# Patient Record
Sex: Female | Born: 1935 | Race: White | Hispanic: No | Marital: Married | Smoking: Never smoker
Health system: Southern US, Community
[De-identification: ages and names within clinical notes are randomized; demographics above are authoritative.]

## PROBLEM LIST (undated history)

## (undated) DIAGNOSIS — R05 Cough: Secondary | ICD-10-CM

## (undated) DIAGNOSIS — E039 Hypothyroidism, unspecified: Secondary | ICD-10-CM

## (undated) DIAGNOSIS — I10 Essential (primary) hypertension: Secondary | ICD-10-CM

## (undated) DIAGNOSIS — E785 Hyperlipidemia, unspecified: Secondary | ICD-10-CM

## (undated) DIAGNOSIS — M1990 Unspecified osteoarthritis, unspecified site: Secondary | ICD-10-CM

## (undated) DIAGNOSIS — Z972 Presence of dental prosthetic device (complete) (partial): Secondary | ICD-10-CM

## (undated) DIAGNOSIS — K219 Gastro-esophageal reflux disease without esophagitis: Secondary | ICD-10-CM

## (undated) DIAGNOSIS — R059 Cough, unspecified: Secondary | ICD-10-CM

## (undated) HISTORY — PX: CATARACT EXTRACTION: SUR2

## (undated) HISTORY — DX: Hyperlipidemia, unspecified: E78.5

## (undated) HISTORY — PX: BREAST CYST ASPIRATION: SHX578

## (undated) HISTORY — PX: TUBAL LIGATION: SHX77

## (undated) HISTORY — PX: BREAST LUMPECTOMY: SHX2

---

## 1976-07-23 HISTORY — PX: BREAST BIOPSY: SHX20

## 1976-07-23 HISTORY — PX: BREAST EXCISIONAL BIOPSY: SUR124

## 2001-06-04 ENCOUNTER — Other Ambulatory Visit: Admission: RE | Admit: 2001-06-04 | Discharge: 2001-06-04 | Payer: Self-pay | Admitting: Family Medicine

## 2004-09-29 ENCOUNTER — Ambulatory Visit: Payer: Self-pay | Admitting: Family Medicine

## 2005-10-09 ENCOUNTER — Ambulatory Visit: Payer: Self-pay | Admitting: Family Medicine

## 2006-11-06 ENCOUNTER — Ambulatory Visit: Payer: Self-pay | Admitting: Family Medicine

## 2006-12-17 ENCOUNTER — Ambulatory Visit: Payer: Self-pay | Admitting: Gastroenterology

## 2006-12-17 LAB — HM COLONOSCOPY

## 2007-09-09 ENCOUNTER — Ambulatory Visit: Payer: Self-pay | Admitting: Gastroenterology

## 2007-11-27 ENCOUNTER — Ambulatory Visit: Payer: Self-pay | Admitting: Family Medicine

## 2008-11-29 ENCOUNTER — Ambulatory Visit: Payer: Self-pay | Admitting: Family Medicine

## 2009-12-20 ENCOUNTER — Ambulatory Visit: Payer: Self-pay | Admitting: Family Medicine

## 2010-12-11 LAB — HM PAP SMEAR: HM PAP: NORMAL

## 2011-01-10 ENCOUNTER — Ambulatory Visit: Payer: Self-pay | Admitting: Family Medicine

## 2012-01-18 ENCOUNTER — Ambulatory Visit: Payer: Self-pay | Admitting: Family Medicine

## 2012-03-14 ENCOUNTER — Ambulatory Visit: Payer: Self-pay | Admitting: Family Medicine

## 2012-12-19 ENCOUNTER — Ambulatory Visit: Payer: Self-pay | Admitting: Family Medicine

## 2012-12-23 ENCOUNTER — Ambulatory Visit: Payer: Self-pay | Admitting: Family Medicine

## 2012-12-23 LAB — HM DEXA SCAN

## 2013-01-19 ENCOUNTER — Ambulatory Visit: Payer: Self-pay | Admitting: Family Medicine

## 2013-09-04 ENCOUNTER — Ambulatory Visit: Payer: Self-pay | Admitting: Family Medicine

## 2014-01-20 ENCOUNTER — Ambulatory Visit: Payer: Self-pay | Admitting: Family Medicine

## 2014-01-20 LAB — HM MAMMOGRAPHY: HM Mammogram: NORMAL

## 2014-03-15 ENCOUNTER — Ambulatory Visit: Payer: Self-pay | Admitting: Family Medicine

## 2014-03-15 LAB — CBC AND DIFFERENTIAL
HEMATOCRIT: 39 % (ref 36–46)
Hemoglobin: 13.1 g/dL (ref 12.0–16.0)
Neutrophils Absolute: 52 /uL
Platelets: 264 10*3/uL (ref 150–399)
WBC: 5.4 10*3/mL

## 2014-03-15 LAB — LIPID PANEL
CHOLESTEROL: 159 mg/dL (ref 0–200)
HDL: 58 mg/dL (ref 35–70)
LDL Cholesterol: 84 mg/dL
LDl/HDL Ratio: 1.4
TRIGLYCERIDES: 85 mg/dL (ref 40–160)

## 2014-03-15 LAB — HEPATIC FUNCTION PANEL
ALK PHOS: 61 U/L (ref 25–125)
ALT: 14 U/L (ref 7–35)
AST: 22 U/L (ref 13–35)
Bilirubin, Total: 0.5 mg/dL

## 2014-06-07 LAB — BASIC METABOLIC PANEL
BUN: 15 mg/dL (ref 4–21)
CREATININE: 0.9 mg/dL (ref <=1.1)
Glucose: 93 mg/dL
POTASSIUM: 3.9 mmol/L (ref 3.4–5.3)
Sodium: 139 mmol/L (ref 137–147)

## 2014-07-21 ENCOUNTER — Ambulatory Visit: Payer: Self-pay | Admitting: Ophthalmology

## 2014-07-21 DIAGNOSIS — Z01810 Encounter for preprocedural cardiovascular examination: Secondary | ICD-10-CM

## 2014-07-21 DIAGNOSIS — I10 Essential (primary) hypertension: Secondary | ICD-10-CM

## 2014-07-21 LAB — POTASSIUM: Potassium: 3.8 mmol/L (ref 3.5–5.1)

## 2014-08-03 ENCOUNTER — Ambulatory Visit: Payer: Self-pay | Admitting: Ophthalmology

## 2014-08-03 DIAGNOSIS — K219 Gastro-esophageal reflux disease without esophagitis: Secondary | ICD-10-CM | POA: Diagnosis not present

## 2014-08-03 DIAGNOSIS — R05 Cough: Secondary | ICD-10-CM | POA: Diagnosis not present

## 2014-08-03 DIAGNOSIS — H2512 Age-related nuclear cataract, left eye: Secondary | ICD-10-CM | POA: Diagnosis not present

## 2014-08-03 DIAGNOSIS — R609 Edema, unspecified: Secondary | ICD-10-CM | POA: Diagnosis not present

## 2014-08-03 DIAGNOSIS — E780 Pure hypercholesterolemia: Secondary | ICD-10-CM | POA: Diagnosis not present

## 2014-08-03 DIAGNOSIS — Z79899 Other long term (current) drug therapy: Secondary | ICD-10-CM | POA: Diagnosis not present

## 2014-08-03 DIAGNOSIS — I10 Essential (primary) hypertension: Secondary | ICD-10-CM | POA: Diagnosis not present

## 2014-08-03 DIAGNOSIS — K279 Peptic ulcer, site unspecified, unspecified as acute or chronic, without hemorrhage or perforation: Secondary | ICD-10-CM | POA: Diagnosis not present

## 2014-08-17 ENCOUNTER — Ambulatory Visit: Payer: Self-pay | Admitting: Ophthalmology

## 2014-08-17 DIAGNOSIS — H2511 Age-related nuclear cataract, right eye: Secondary | ICD-10-CM | POA: Diagnosis not present

## 2014-08-17 DIAGNOSIS — Z01812 Encounter for preprocedural laboratory examination: Secondary | ICD-10-CM | POA: Diagnosis not present

## 2014-08-17 LAB — POTASSIUM: Potassium: 3.2 mmol/L — ABNORMAL LOW (ref 3.5–5.1)

## 2014-08-24 ENCOUNTER — Ambulatory Visit: Payer: Self-pay | Admitting: Ophthalmology

## 2014-08-24 DIAGNOSIS — Z79899 Other long term (current) drug therapy: Secondary | ICD-10-CM | POA: Diagnosis not present

## 2014-08-24 DIAGNOSIS — H2511 Age-related nuclear cataract, right eye: Secondary | ICD-10-CM | POA: Diagnosis not present

## 2014-08-24 DIAGNOSIS — R05 Cough: Secondary | ICD-10-CM | POA: Diagnosis not present

## 2014-08-24 DIAGNOSIS — I10 Essential (primary) hypertension: Secondary | ICD-10-CM | POA: Diagnosis not present

## 2014-08-24 DIAGNOSIS — E780 Pure hypercholesterolemia: Secondary | ICD-10-CM | POA: Diagnosis not present

## 2014-08-24 DIAGNOSIS — R609 Edema, unspecified: Secondary | ICD-10-CM | POA: Diagnosis not present

## 2014-08-24 DIAGNOSIS — M810 Age-related osteoporosis without current pathological fracture: Secondary | ICD-10-CM | POA: Diagnosis not present

## 2014-08-24 DIAGNOSIS — K219 Gastro-esophageal reflux disease without esophagitis: Secondary | ICD-10-CM | POA: Diagnosis not present

## 2014-08-24 DIAGNOSIS — K279 Peptic ulcer, site unspecified, unspecified as acute or chronic, without hemorrhage or perforation: Secondary | ICD-10-CM | POA: Diagnosis not present

## 2014-09-14 DIAGNOSIS — R5383 Other fatigue: Secondary | ICD-10-CM | POA: Diagnosis not present

## 2014-09-14 DIAGNOSIS — E039 Hypothyroidism, unspecified: Secondary | ICD-10-CM | POA: Diagnosis not present

## 2014-09-14 DIAGNOSIS — J309 Allergic rhinitis, unspecified: Secondary | ICD-10-CM | POA: Diagnosis not present

## 2014-09-14 DIAGNOSIS — R05 Cough: Secondary | ICD-10-CM | POA: Diagnosis not present

## 2014-09-14 LAB — TSH: TSH: 3.25 u[IU]/mL (ref <=5.90)

## 2014-09-24 DIAGNOSIS — Z961 Presence of intraocular lens: Secondary | ICD-10-CM | POA: Diagnosis not present

## 2014-11-21 NOTE — Op Note (Signed)
PATIENT NAME:  Lynn Williams, Lynn Williams MR#:  [id number] DATE OF BIRTH:  23-Dec-1931  DATE OF PROCEDURE:  08/03/2014  PREOPERATIVE DIAGNOSIS:  Nuclear sclerotic cataract of the left eye.   POSTOPERATIVE DIAGNOSIS:  Nuclear sclerotic cataract of the left eye.   OPERATIVE PROCEDURE:  Cataract extraction by phacoemulsification with implant of intraocular lens to left eye.   SURGEON:  Birder Robson, MD.   ANESTHESIA:  1. Managed anesthesia care.  2. Topical tetracaine drops followed by 2% Xylocaine jelly applied in the preoperative holding area.   COMPLICATIONS:  None.   TECHNIQUE:   Stop and chop.  DESCRIPTION OF PROCEDURE:  The patient was examined and consented in the preoperative holding area where the aforementioned topical anesthesia was applied to the left eye and then brought back to the operating room where the left eye was prepped and draped in the usual sterile ophthalmic fashion and a lid speculum was placed. A paracentesis was created with the side port blade and the anterior chamber was filled with viscoelastic. A near clear corneal incision was performed with the steel keratome. A continuous curvilinear capsulorrhexis was performed with a cystotome followed by the capsulorrhexis forceps. Hydrodissection and hydrodelineation were carried out with BSS on a blunt cannula. The lens was removed in a stop and chop technique and the remaining cortical material was removed with the irrigation-aspiration handpiece. The capsular bag was inflated with viscoelastic and the Tecnis ZCB00 29.5-diopter lens, serial number [id number] was placed in the capsular bag without complication. The remaining viscoelastic was removed from the eye with the irrigation-aspiration handpiece. The wounds were hydrated. The anterior chamber was flushed with Miostat and the eye was inflated to physiologic pressure. 0.1 mL of cefuroxime concentration 10 mg/mL was placed in the anterior chamber. The wounds were found to be  water tight. The eye was dressed with Vigamox. The patient was given protective glasses to wear throughout the day and a shield with which to sleep tonight. The patient was also given drops with which to begin a drop regimen today and will follow-up with me in one day.    ____________________________ Livingston Diones. Kyshawn Teal, MD wlp:bm D: 08/03/2014 21:07:10 ET T: 08/04/2014 04:01:58 ET JOB#: [id number]  cc: Eowyn Tabone L. Willetta York, MD, <Dictator> Livingston Diones Sister Carbone MD ELECTRONICALLY SIGNED 08/04/2014 11:19

## 2014-11-26 DIAGNOSIS — M1990 Unspecified osteoarthritis, unspecified site: Secondary | ICD-10-CM | POA: Insufficient documentation

## 2014-11-26 DIAGNOSIS — E039 Hypothyroidism, unspecified: Secondary | ICD-10-CM | POA: Insufficient documentation

## 2014-11-26 DIAGNOSIS — J309 Allergic rhinitis, unspecified: Secondary | ICD-10-CM | POA: Insufficient documentation

## 2014-11-26 DIAGNOSIS — K5790 Diverticulosis of intestine, part unspecified, without perforation or abscess without bleeding: Secondary | ICD-10-CM | POA: Insufficient documentation

## 2014-11-26 DIAGNOSIS — E7800 Pure hypercholesterolemia, unspecified: Secondary | ICD-10-CM | POA: Insufficient documentation

## 2014-11-26 DIAGNOSIS — C44311 Basal cell carcinoma of skin of nose: Secondary | ICD-10-CM | POA: Insufficient documentation

## 2014-11-26 DIAGNOSIS — G4700 Insomnia, unspecified: Secondary | ICD-10-CM | POA: Insufficient documentation

## 2014-11-26 DIAGNOSIS — I10 Essential (primary) hypertension: Secondary | ICD-10-CM | POA: Insufficient documentation

## 2014-11-26 DIAGNOSIS — N6019 Diffuse cystic mastopathy of unspecified breast: Secondary | ICD-10-CM | POA: Insufficient documentation

## 2015-01-11 ENCOUNTER — Other Ambulatory Visit: Payer: Self-pay

## 2015-01-11 MED ORDER — ALPRAZOLAM 0.5 MG PO TABS: 0.5000 mg | ORAL_TABLET | Freq: Every day | ORAL | Status: DC

## 2015-01-11 NOTE — Telephone Encounter (Signed)
Sorry, I put it on the first one I did and forgot to on this one.  It is [facility].  Thanks  ED

## 2015-01-25 ENCOUNTER — Encounter: Payer: Self-pay | Admitting: Family Medicine

## 2015-01-25 ENCOUNTER — Ambulatory Visit (INDEPENDENT_AMBULATORY_CARE_PROVIDER_SITE_OTHER): Payer: Commercial Managed Care - HMO | Admitting: Family Medicine

## 2015-01-25 VITALS — BP 120/78 | HR 72 | Temp 98.2°F | Resp 17 | Ht 61.5 in | Wt 175.6 lb

## 2015-01-25 DIAGNOSIS — Z0000 Encounter for general adult medical examination without abnormal findings: Secondary | ICD-10-CM | POA: Diagnosis not present

## 2015-01-25 NOTE — Progress Notes (Signed)
Patient ID: [name], female   DOB: [birth year], 81 y.o.   MRN: [id number]       Patient: Lynn Williams, Female    DOB: 10/07/1931, 81 y.o.   MRN: [id number] Visit Date: 01/25/2015  Today's Provider: [name], MD   Chief Complaint  Patient presents with  . Annual Exam    last Annual Exam was in 12/23/2013   Subjective:    Annual physical exam Lynn Williams is a 81 y.o. female who presents today for health maintenance and complete physical. She feels well. She reports exercising : four days a week, go to a fitness center. She reports she is sleeping well.  -----------------------------------------------------------------   Review of Systems  Constitutional: Negative.   HENT: Negative.  Negative for hearing loss.        Seasonal allergies  Eyes: Negative.   Respiratory: Positive for cough.        Some comes from the allergies", MD awaird, pt stated  Cardiovascular: Negative.   Gastrointestinal: Negative.   Endocrine: Negative.   Musculoskeletal: Negative.   Skin: Negative.   Allergic/Immunologic: Positive for environmental allergies.  Hematological: Negative.   Psychiatric/Behavioral: Negative.     Social History She  reports that she has never smoked. She does not have any smokeless tobacco history on file.  Patient Active Problem List   Diagnosis Date Noted  . Allergic rhinitis 11/26/2014  . Basal cell carcinoma of nose 11/26/2014  . DD (diverticular disease) 11/26/2014  . Bloodgood disease 11/26/2014  . Essential (primary) hypertension 11/26/2014  . Hypercholesteremia 11/26/2014  . Adult hypothyroidism 11/26/2014  . Cannot sleep 11/26/2014  . Arthritis, degenerative 11/26/2014    Past Surgical History  Procedure Laterality Date  . Tubal ligation    . Breast lumpectomy Right     Family History Her family history includes Alzheimer's disease in her sister; COPD in her brother and brother; Coronary artery disease in her sister  and sister; Diabetes in her sister; Heart attack in her brother, brother, mother, sister, and sister; Heart disease in her brother, brother, and sister; Hypertension in her mother and sister; Prostate cancer in her father; Stroke in her mother and sister.    Previous Medications   ALPRAZOLAM (XANAX) 0.5 MG TABLET    Take 1 tablet (0.5 mg total) by mouth at bedtime.   CALCIUM-MAGNESIUM-VITAMIN D (CALCIUM 500 PO)    Take by mouth.   CETIRIZINE (ZYRTEC) 10 MG TABLET    Take by mouth.   FLUTICASONE (FLONASE) 50 MCG/ACT NASAL SPRAY    Place into the nose.   FUROSEMIDE (LASIX) 20 MG TABLET    Take by mouth.   HYDROCHLOROTHIAZIDE (HYDRODIURIL) 25 MG TABLET    Take by mouth.   LEVOTHYROXINE (SYNTHROID, LEVOTHROID) 50 MCG TABLET    Take by mouth.   MULTIPLE VITAMINS-MINERALS (MULTIVITAMIN ADULT PO)    Take by mouth.   OMEPRAZOLE (PRILOSEC) 20 MG CAPSULE    Take by mouth.   RANITIDINE (ZANTAC) 150 MG TABLET    Take by mouth.   SIMVASTATIN (ZOCOR) 20 MG TABLET    Take by mouth.    Patient Care Team: Jerrol Banana., MD as PCP - General (Family Medicine)     Objective:   Vitals: BP 120/78 mmHg  Pulse 72  Temp(Src) 98.2 F (36.8 C) (Oral)  Resp 17  Ht 5' 1.5" (1.562 m)  Wt 175 lb 9.6 oz (79.652 kg)  BMI 32.65 kg/m2  LMP  (LMP Unknown)  Physical Exam  Constitutional: She is oriented to person, place, and time. She appears well-developed and well-nourished.  HENT:  Head: Normocephalic and atraumatic.  Right Ear: External ear normal.  Left Ear: External ear normal.  Nose: Nose normal.  Mouth/Throat: Oropharynx is clear and moist.  Eyes: Conjunctivae and EOM are normal. Pupils are equal, round, and reactive to light.  Neck: Normal range of motion. Neck supple. No thyromegaly present.  Cardiovascular: Normal rate, regular rhythm, normal heart sounds and intact distal pulses.   Mild spider varicose veins of lower extremities  Pulmonary/Chest: Effort normal and breath sounds normal.    Abdominal: Soft.  Genitourinary: No breast swelling, tenderness, discharge or bleeding.  Lymphadenopathy:    She has no cervical adenopathy.  Neurological: She is alert and oriented to person, place, and time.  Skin: Skin is warm and dry.  Psychiatric: She has a normal mood and affect. Her behavior is normal. Judgment and thought content normal.     Depression Screen PHQ 2/9 Scores 01/25/2015  PHQ - 2 Score 0      Assessment & Plan:    Annual wellness exam   Routine Health Maintenance and Physical Exam  Exercise Activities and Dietary recommendations Goals    None      Immunization History  Administered Date(s) Administered  . Pneumococcal Conjugate-13 12/23/2013  . Pneumococcal Polysaccharide-23 07/24/1996, 08/04/2003    Health Maintenance  Topic Date Due  . TETANUS/TDAP  05/07/1953  . ZOSTAVAX  05/07/1994  . INFLUENZA VACCINE  02/21/2015  . COLONOSCOPY  12/16/2016  . DEXA SCAN  Completed  . PNA vac Low Risk Adult  Completed      Discussed health benefits of physical activity, and encouraged her to engage in regular exercise appropriate for her age and condition.   Weight gain. Currently not worrisome. We'll evaluate on next office visit 1-2 months. - Annual Medicare wellness exam -------------------------------------------------------------------

## 2015-03-15 ENCOUNTER — Ambulatory Visit (INDEPENDENT_AMBULATORY_CARE_PROVIDER_SITE_OTHER): Payer: Commercial Managed Care - HMO | Admitting: Family Medicine

## 2015-03-15 DIAGNOSIS — R635 Abnormal weight gain: Secondary | ICD-10-CM

## 2015-03-15 DIAGNOSIS — E039 Hypothyroidism, unspecified: Secondary | ICD-10-CM | POA: Diagnosis not present

## 2015-03-15 DIAGNOSIS — E785 Hyperlipidemia, unspecified: Secondary | ICD-10-CM | POA: Diagnosis not present

## 2015-03-15 DIAGNOSIS — I10 Essential (primary) hypertension: Secondary | ICD-10-CM

## 2015-03-15 DIAGNOSIS — I499 Cardiac arrhythmia, unspecified: Secondary | ICD-10-CM

## 2015-03-15 NOTE — Progress Notes (Signed)
Patient ID: Lynn Williams, female   DOB: Dec 16, 1931, 81 y.o.   MRN: [id number]   Lynn Williams  MRN: [id number] DOB: 1932-06-15  Subjective:  HPI   1. Weight gain Patient is an 81 year old female who presents today for follow up of weight gain.  The patient was last seen on 01/25/15.  Her weight at that time was 175, this was a 4 pound weight gain from her previous visit in February 2016.  The patient today is 172, which is a 3 pound weight loss.    2. Irregular heart beat Patient reports that about 3 weeks ago she had an episode of having an irregular heartbeat.  She noticed it when she went to bed one night.  She noted that every time it would skip a beat it would make her cough.  She checked her BP at that time and said that it was at first higher than her normal.  Her machine recorded her HR at around 80 and irregular.  She said she took an ASA, and later checked her BP and things had returned to normal and she no longer felt the skipped beats or had any coughing.  Patient states that this episode lasted about 3 hours and she has no longer had any episodes.  Patient Active Problem List   Diagnosis Date Noted  . Medicare annual wellness visit, subsequent 01/25/2015  . Allergic rhinitis 11/26/2014  . Basal cell carcinoma of nose 11/26/2014  . DD (diverticular disease) 11/26/2014  . Bloodgood disease 11/26/2014  . Essential (primary) hypertension 11/26/2014  . Hypercholesteremia 11/26/2014  . Adult hypothyroidism 11/26/2014  . Cannot sleep 11/26/2014  . Arthritis, degenerative 11/26/2014    No past medical history on file.  Social History   Social History  . Marital Status: Married    Spouse Name: N/A  . Number of Children: N/A  . Years of Education: N/A   Occupational History  . Not on file.   Social History Main Topics  . Smoking status: Never Smoker   . Smokeless tobacco: Not on file  . Alcohol Use: Not on file  . Drug Use: Not on file  . Sexual Activity: Not  on file   Other Topics Concern  . Not on file   Social History Narrative    Outpatient Prescriptions Prior to Visit  Medication Sig Dispense Refill  . ALPRAZolam (XANAX) 0.5 MG tablet Take 1 tablet (0.5 mg total) by mouth at bedtime. 30 tablet 5  . Calcium-Magnesium-Vitamin D (CALCIUM 500 PO) Take by mouth.    . cetirizine (ZYRTEC) 10 MG tablet Take by mouth.    . furosemide (LASIX) 20 MG tablet Take by mouth.    . hydrochlorothiazide (HYDRODIURIL) 25 MG tablet Take by mouth.    . levothyroxine (SYNTHROID, LEVOTHROID) 50 MCG tablet Take by mouth.    . Multiple Vitamins-Minerals (MULTIVITAMIN ADULT PO) Take by mouth.    Marland Kitchen omeprazole (PRILOSEC) 20 MG capsule Take by mouth.    . ranitidine (ZANTAC) 150 MG tablet Take by mouth.    . simvastatin (ZOCOR) 20 MG tablet Take by mouth.    . fluticasone (FLONASE) 50 MCG/ACT nasal spray Place into the nose.     No facility-administered medications prior to visit.    Allergies  Allergen Reactions  . Sulfa Antibiotics     Review of Systems  Constitutional: Negative for fever, chills, weight loss, malaise/fatigue and diaphoresis.  Respiratory: Positive for cough (chronic with no changes). Negative for hemoptysis, sputum  production, shortness of breath and wheezing.   Cardiovascular: Positive for palpitations. Negative for chest pain, orthopnea and leg swelling.  Gastrointestinal: Negative.   Genitourinary: Negative.   Neurological: Negative for dizziness, weakness and headaches.  Psychiatric/Behavioral: Negative.    Objective:  LMP  (LMP Unknown)  Physical Exam  Constitutional: She is oriented to person, place, and time and well-developed, well-nourished, and in no distress.  HENT:  Head: Normocephalic and atraumatic.  Right Ear: External ear normal.  Left Ear: External ear normal.  Nose: Nose normal.  Eyes: Conjunctivae are normal.  Neck: Normal range of motion. Neck supple.  Cardiovascular: Normal rate, regular rhythm and normal  heart sounds.   Pulmonary/Chest: Effort normal and breath sounds normal.  Abdominal: Soft.  Neurological: She is alert and oriented to person, place, and time.  Skin: Skin is warm and dry.  Psychiatric: Mood, memory, affect and judgment normal.    Assessment and Plan :   1. Weight gain 3 pound weight loss since 01/25/15.    2. Irregular heart beat  - EKG 12-Lead 3. Hypertension 4. Hypothyroidism Patient to return in 4-6 months.  Miguel Aschoff MD [facility] 03/15/2015 8:23 AM

## 2015-03-17 LAB — COMPREHENSIVE METABOLIC PANEL
ALBUMIN: 4.4 g/dL (ref 3.5–4.7)
ALT: 15 IU/L (ref 0–32)
AST: 22 IU/L (ref 0–40)
Albumin/Globulin Ratio: 1.7 (ref 1.1–2.5)
Alkaline Phosphatase: 61 IU/L (ref 39–117)
BILIRUBIN TOTAL: 0.6 mg/dL (ref 0.0–1.2)
BUN / CREAT RATIO: 15 (ref 11–26)
BUN: 14 mg/dL (ref 8–27)
CHLORIDE: 97 mmol/L (ref 97–108)
CO2: 30 mmol/L — ABNORMAL HIGH (ref 18–29)
Calcium: 9.6 mg/dL (ref 8.7–10.3)
Creatinine, Ser: 0.94 mg/dL (ref 0.57–1.00)
GFR calc non Af Amer: 57 mL/min/{1.73_m2} — ABNORMAL LOW (ref >59)
GFR, EST AFRICAN AMERICAN: 66 mL/min/{1.73_m2} (ref >59)
Globulin, Total: 2.6 g/dL (ref 1.5–4.5)
Glucose: 93 mg/dL (ref 65–99)
Potassium: 4.1 mmol/L (ref 3.5–5.2)
SODIUM: 142 mmol/L (ref 134–144)
TOTAL PROTEIN: 7 g/dL (ref 6.0–8.5)

## 2015-03-17 LAB — LIPID PANEL WITH LDL/HDL RATIO
CHOLESTEROL TOTAL: 162 mg/dL (ref 100–199)
HDL: 57 mg/dL (ref >39)
LDL Calculated: 86 mg/dL (ref 0–99)
LDl/HDL Ratio: 1.5 ratio units (ref 0.0–3.2)
Triglycerides: 95 mg/dL (ref 0–149)
VLDL CHOLESTEROL CAL: 19 mg/dL (ref 5–40)

## 2015-03-17 LAB — TSH: TSH: 2.38 u[IU]/mL (ref 0.450–4.500)

## 2015-03-24 DIAGNOSIS — Z961 Presence of intraocular lens: Secondary | ICD-10-CM | POA: Diagnosis not present

## 2015-05-04 ENCOUNTER — Ambulatory Visit (INDEPENDENT_AMBULATORY_CARE_PROVIDER_SITE_OTHER): Payer: Commercial Managed Care - HMO | Admitting: Family Medicine

## 2015-05-04 DIAGNOSIS — Z23 Encounter for immunization: Secondary | ICD-10-CM | POA: Diagnosis not present

## 2015-07-11 ENCOUNTER — Other Ambulatory Visit: Payer: Self-pay

## 2015-07-11 MED ORDER — LEVOTHYROXINE SODIUM 50 MCG PO TABS: 50.0000 ug | ORAL_TABLET | Freq: Every day | ORAL | Status: DC

## 2015-08-25 ENCOUNTER — Encounter: Payer: Self-pay | Admitting: Family Medicine

## 2015-08-25 ENCOUNTER — Ambulatory Visit (INDEPENDENT_AMBULATORY_CARE_PROVIDER_SITE_OTHER): Payer: Commercial Managed Care - HMO | Admitting: Family Medicine

## 2015-08-25 VITALS — BP 138/80 | HR 74 | Temp 97.5°F | Resp 16 | Ht 62.0 in | Wt 171.0 lb

## 2015-08-25 DIAGNOSIS — Z0000 Encounter for general adult medical examination without abnormal findings: Secondary | ICD-10-CM | POA: Diagnosis not present

## 2015-08-25 DIAGNOSIS — Z1239 Encounter for other screening for malignant neoplasm of breast: Secondary | ICD-10-CM | POA: Diagnosis not present

## 2015-08-25 MED ORDER — SIMVASTATIN 20 MG PO TABS: 20.0000 mg | ORAL_TABLET | Freq: Every day | ORAL | Status: DC

## 2015-08-25 MED ORDER — FUROSEMIDE 20 MG PO TABS: 20.0000 mg | ORAL_TABLET | Freq: Every day | ORAL | Status: DC

## 2015-08-25 MED ORDER — OMEPRAZOLE 20 MG PO CPDR: 20.0000 mg | DELAYED_RELEASE_CAPSULE | Freq: Every day | ORAL | Status: DC

## 2015-08-25 MED ORDER — ALPRAZOLAM 0.5 MG PO TABS: 0.5000 mg | ORAL_TABLET | Freq: Every day | ORAL | Status: DC

## 2015-08-25 MED ORDER — HYDROCHLOROTHIAZIDE 25 MG PO TABS: 25.0000 mg | ORAL_TABLET | Freq: Every day | ORAL | Status: DC

## 2015-08-25 MED ORDER — RANITIDINE HCL 150 MG PO TABS: 150.0000 mg | ORAL_TABLET | Freq: Two times a day (BID) | ORAL | Status: DC

## 2015-08-25 MED ORDER — LEVOTHYROXINE SODIUM 50 MCG PO TABS: 50.0000 ug | ORAL_TABLET | Freq: Every day | ORAL | Status: DC

## 2015-08-25 NOTE — Progress Notes (Signed)
Patient ID: [name], female   DOB: Jul 22, 1932, 82 y.o.   MRN: [id number] Patient: Lynn Williams, Female    DOB: 1931/12/06, 82 y.o.   MRN: [id number] Visit Date: 08/25/2015  Today's Provider: [name], MD   Chief Complaint  Patient presents with  . Medicare Wellness   Subjective:   Lynn Williams is a 82 y.o. female who presents today for her Subsequent Annual Wellness Visit. She feels well. She reports exercising 4 days a week in a exercise class. She reports she is sleeping well. Pt had 1 week GI bug about 2 weeks ago which has completely resolved. Pap- 12/11/10- Normal Mammogram- 01/20/14 Normal Colonoscopy- 12/17/06 Diverticulosis, hemorrhoids BMD- 12/23/12   Immunization History  Administered Date(s) Administered  . Influenza, High Dose Seasonal PF 05/04/2015  . Pneumococcal Conjugate-13 12/23/2013  . Pneumococcal Polysaccharide-23 07/24/1996, 08/04/2003     Review of Systems  Constitutional: Negative.   HENT: Negative.   Eyes: Negative.   Respiratory: Positive for cough.   Cardiovascular: Negative.   Gastrointestinal: Positive for abdominal pain.  Endocrine: Negative.   Genitourinary: Negative.   Musculoskeletal: Positive for back pain.  Skin: Negative.   Allergic/Immunologic: Negative.   Neurological: Negative.   Hematological: Negative.   Psychiatric/Behavioral: The patient is nervous/anxious.     Patient Active Problem List   Diagnosis Date Noted  . Medicare annual wellness visit, subsequent 01/25/2015  . Allergic rhinitis 11/26/2014  . Basal cell carcinoma of nose 11/26/2014  . DD (diverticular disease) 11/26/2014  . Bloodgood disease 11/26/2014  . Essential (primary) hypertension 11/26/2014  . Hypercholesteremia 11/26/2014  . Adult hypothyroidism 11/26/2014  . Cannot sleep 11/26/2014  . Arthritis, degenerative 11/26/2014    Social History   Social History  . Marital Status: Married    Spouse Name: N/A  . Number of  Children: N/A  . Years of Education: N/A   Occupational History  . Not on file.   Social History Main Topics  . Smoking status: Never Smoker   . Smokeless tobacco: Not on file  . Alcohol Use: Not on file  . Drug Use: Not on file  . Sexual Activity: Not on file   Other Topics Concern  . Not on file   Social History Narrative    Past Surgical History  Procedure Laterality Date  . Tubal ligation    . Breast lumpectomy Right     Her family history includes Alzheimer's disease in her sister; COPD in her brother and brother; Coronary artery disease in her sister and sister; Diabetes in her sister; Heart attack in her brother, brother, mother, sister, and sister; Heart disease in her brother, brother, and sister; Hypertension in her mother and sister; Prostate cancer in her father; Stroke in her mother and sister.    Outpatient Prescriptions Prior to Visit  Medication Sig Dispense Refill  . ALPRAZolam (XANAX) 0.5 MG tablet Take 1 tablet (0.5 mg total) by mouth at bedtime. 30 tablet 5  . Calcium-Magnesium-Vitamin D (CALCIUM 500 PO) Take by mouth.    . cetirizine (ZYRTEC) 10 MG tablet Take by mouth.    . furosemide (LASIX) 20 MG tablet Take by mouth.    . hydrochlorothiazide (HYDRODIURIL) 25 MG tablet Take by mouth.    . levothyroxine (SYNTHROID, LEVOTHROID) 50 MCG tablet Take 1 tablet (50 mcg total) by mouth daily before breakfast. 30 tablet 5  . Multiple Vitamins-Minerals (MULTIVITAMIN ADULT PO) Take by mouth.    Marland Kitchen omeprazole (PRILOSEC) 20 MG capsule Take by mouth.    Marland Kitchen  ranitidine (ZANTAC) 150 MG tablet Take by mouth.    . simvastatin (ZOCOR) 20 MG tablet Take by mouth.     No facility-administered medications prior to visit.    Allergies  Allergen Reactions  . Sulfa Antibiotics     Patient Care Team: Jerrol Banana., MD as PCP - General (Family Medicine)  Objective:   Vitals: There were no vitals filed for this visit.  Physical Exam  Constitutional: She is  oriented to person, place, and time. She appears well-developed and well-nourished.  HENT:  Head: Normocephalic and atraumatic.  Right Ear: External ear normal.  Left Ear: External ear normal.  Nose: Nose normal.  Mouth/Throat: Oropharynx is clear and moist.  Eyes: Conjunctivae and EOM are normal. Pupils are equal, round, and reactive to light.  Neck: Normal range of motion. Neck supple.  Cardiovascular: Normal rate, regular rhythm, normal heart sounds and intact distal pulses.   Pulmonary/Chest: Effort normal and breath sounds normal.  Abdominal: Soft. Bowel sounds are normal.  Musculoskeletal: Normal range of motion.  Neurological: She is alert and oriented to person, place, and time. She has normal reflexes.  Skin: Skin is warm and dry.  Psychiatric: She has a normal mood and affect. Her behavior is normal. Judgment and thought content normal.    Activities of Daily Living In your present state of health, do you have any difficulty performing the following activities: 01/25/2015 01/25/2015  Hearing? - N  Vision? N N  Difficulty concentrating or making decisions? N Y  Walking or climbing stairs? N N  Dressing or bathing? N N  Doing errands, shopping? N N    Fall Risk Assessment Fall Risk  01/25/2015  Falls in the past year? No     Depression Screen PHQ 2/9 Scores 01/25/2015 01/25/2015  PHQ - 2 Score 0 0    Cognitive Testing - 6-CIT    Year: 0 points  Month: 0 points  Memorize "Pia Mau, [address], [place]"  Time (within 1 hour:) 0 points  Count backwards from 20: 0 points  Name months of year: 2 points  Repeat Address: 0 points   Total Score: 2/28  Interpretation : Normal (0-7) Abnormal (8-28)    Assessment & Plan:     Annual Wellness Visit  Reviewed patient's Family Medical History Reviewed and updated list of patient's medical providers Assessment of cognitive impairment was done Assessed patient's functional ability Established a written schedule for  health screening [place] Completed and Reviewed  Exercise Activities and Dietary recommendations Goals    None      Immunization History  Administered Date(s) Administered  . Influenza, High Dose Seasonal PF 05/04/2015  . Pneumococcal Conjugate-13 12/23/2013  . Pneumococcal Polysaccharide-23 07/24/1996, 08/04/2003    Health Maintenance  Topic Date Due  . TETANUS/TDAP  05/07/1953  . ZOSTAVAX  05/07/1994  . INFLUENZA VACCINE  02/21/2016  . DEXA SCAN  Completed  . PNA vac Low Risk Adult  Completed      Discussed health benefits of physical activity, and encouraged her to engage in regular exercise appropriate for her age and condition.    Miguel Aschoff MD [facility] 08/25/2015 8:31 AM  ------------------------------------------------------------------------------------------------------------

## 2015-09-06 ENCOUNTER — Other Ambulatory Visit: Payer: Self-pay | Admitting: Family Medicine

## 2015-09-06 ENCOUNTER — Ambulatory Visit
Admission: RE | Admit: 2015-09-06 | Discharge: 2015-09-06 | Disposition: A | Discharge status: Home / Self Care | Payer: Commercial Managed Care - HMO | Source: Ambulatory Visit | Attending: Family Medicine | Admitting: Family Medicine

## 2015-09-06 DIAGNOSIS — Z1231 Encounter for screening mammogram for malignant neoplasm of breast: Secondary | ICD-10-CM | POA: Insufficient documentation

## 2015-09-06 DIAGNOSIS — Z1239 Encounter for other screening for malignant neoplasm of breast: Secondary | ICD-10-CM

## 2015-11-28 ENCOUNTER — Ambulatory Visit (INDEPENDENT_AMBULATORY_CARE_PROVIDER_SITE_OTHER): Payer: Commercial Managed Care - HMO | Admitting: Family Medicine

## 2015-11-28 VITALS — BP 142/78 | HR 72 | Temp 97.6°F | Resp 16 | Wt 175.0 lb

## 2015-11-28 DIAGNOSIS — E039 Hypothyroidism, unspecified: Secondary | ICD-10-CM

## 2015-11-28 DIAGNOSIS — I10 Essential (primary) hypertension: Secondary | ICD-10-CM

## 2015-11-28 DIAGNOSIS — A048 Other specified bacterial intestinal infections: Secondary | ICD-10-CM

## 2015-11-28 DIAGNOSIS — R1013 Epigastric pain: Secondary | ICD-10-CM | POA: Diagnosis not present

## 2015-11-28 DIAGNOSIS — B9681 Helicobacter pylori [H. pylori] as the cause of diseases classified elsewhere: Secondary | ICD-10-CM

## 2015-11-28 DIAGNOSIS — R102 Pelvic and perineal pain: Secondary | ICD-10-CM

## 2015-11-28 DIAGNOSIS — E785 Hyperlipidemia, unspecified: Secondary | ICD-10-CM | POA: Diagnosis not present

## 2015-11-28 MED ORDER — PANTOPRAZOLE SODIUM 40 MG PO TBEC: 40.0000 mg | DELAYED_RELEASE_TABLET | Freq: Two times a day (BID) | ORAL | Status: DC

## 2015-11-28 NOTE — Progress Notes (Signed)
Patient ID: Lynn Williams, female   DOB: [birth year], 83 y.o.   MRN: [id number]   JENASYS JIMMERSON  MRN: [id number] DOB: 1931-11-19  Subjective:  HPI   The patient is an 82 year old female who presents for follow up of her chronic issues.  She is on blood pressure, thyroid and cholesterol medication and states she needs her labs done today.  She states her blood pressure at home is better than what we got today.  She states it generally runs in the 120s over 70s.    The patient is also complaining of some epigastric pain with nausea and bloating.  She states that her symptoms have been going on for about three months. She feels the pain and nausea mostly after she eats.  She denies any acid reflux or indigestion.    The patient also complains of some pain that goes from her lower back around to her pelvic area.  She denies any urinary symptoms, vaginal discharge, burning or itching.  She states she does have some bowel incontinence, but otherwise her bowel habits have not changed. She has epigastric and pelvic bloating at times. Pants do feel tight but she can still button them.  Patient Active Problem List   Diagnosis Date Noted  . Medicare annual wellness visit, subsequent 01/25/2015  . Allergic rhinitis 11/26/2014  . Basal cell carcinoma of nose 11/26/2014  . DD (diverticular disease) 11/26/2014  . Bloodgood disease 11/26/2014  . Essential (primary) hypertension 11/26/2014  . Hypercholesteremia 11/26/2014  . Adult hypothyroidism 11/26/2014  . Cannot sleep 11/26/2014  . Arthritis, degenerative 11/26/2014    No past medical history on file.  Social History   Social History  . Marital Status: Married    Spouse Name: N/A  . Number of Children: N/A  . Years of Education: N/A   Occupational History  . Not on file.   Social History Main Topics  . Smoking status: Never Smoker   . Smokeless tobacco: Not on file  . Alcohol Use: No  . Drug Use: No  . Sexual Activity: Not  on file   Other Topics Concern  . Not on file   Social History Narrative    Outpatient Prescriptions Prior to Visit  Medication Sig Dispense Refill  . ALPRAZolam (XANAX) 0.5 MG tablet Take 1 tablet (0.5 mg total) by mouth at bedtime. 90 tablet 1  . Calcium-Magnesium-Vitamin D (CALCIUM 500 PO) Take by mouth.    . cetirizine (ZYRTEC) 10 MG tablet Take by mouth.    . furosemide (LASIX) 20 MG tablet Take 1 tablet (20 mg total) by mouth daily. (Patient taking differently: Take 20 mg by mouth daily as needed. ) 90 tablet 3  . hydrochlorothiazide (HYDRODIURIL) 25 MG tablet Take 1 tablet (25 mg total) by mouth daily. 90 tablet 3  . levothyroxine (SYNTHROID, LEVOTHROID) 50 MCG tablet Take 1 tablet (50 mcg total) by mouth daily before breakfast. 30 tablet 5  . Multiple Vitamins-Minerals (MULTIVITAMIN ADULT PO) Take by mouth.    Marland Kitchen omeprazole (PRILOSEC) 20 MG capsule Take 1 capsule (20 mg total) by mouth daily. 90 capsule 3  . ranitidine (ZANTAC) 150 MG tablet Take 1 tablet (150 mg total) by mouth 2 (two) times daily. 180 tablet 3  . simvastatin (ZOCOR) 20 MG tablet Take 1 tablet (20 mg total) by mouth at bedtime. 90 tablet 3   No facility-administered medications prior to visit.    Allergies  Allergen Reactions  . Sulfa Antibiotics  Review of Systems  Constitutional: Negative for fever and malaise/fatigue.  Respiratory: Positive for cough, shortness of breath and wheezing.        Her respiratory symptoms are chronic and unchanged.  Cardiovascular: Positive for leg swelling (ankle swelling, chronic but unchanged.). Negative for chest pain, palpitations, orthopnea and claudication.  Gastrointestinal: Positive for nausea and abdominal pain. Negative for heartburn, vomiting, diarrhea, constipation, blood in stool and melena.  Genitourinary: Positive for flank pain. Negative for dysuria, urgency, frequency and hematuria.  Musculoskeletal: Positive for back pain.  Neurological: Negative for  dizziness, weakness and headaches.  Psychiatric/Behavioral: Negative.    Objective:  BP 142/78 mmHg  Pulse 72  Temp(Src) 97.6 F (36.4 C) (Oral)  Resp 16  Wt 175 lb (79.379 kg)  LMP  (LMP Unknown)  Physical Exam  Constitutional: She is well-developed, well-nourished, and in no distress.  HENT:  Head: Normocephalic and atraumatic.  Right Ear: External ear normal.  Left Ear: External ear normal.  Nose: Nose normal.  Eyes: Conjunctivae are normal. Pupils are equal, round, and reactive to light.  Neck: Normal range of motion. Neck supple. No thyromegaly present.  Cardiovascular: Normal rate, regular rhythm and normal heart sounds.   Pulmonary/Chest: Effort normal and breath sounds normal.  Abdominal: Soft. Bowel sounds are normal. There is tenderness (Mild diffuse epigastric tenderness, ore in the LUQ.).  Genitourinary: Right adnexa normal and left adnexa normal. Guaiac negative stool.   Pelvic exam/bimanual exam is normal.  Musculoskeletal: She exhibits edema (trace bilaterally).  Lymphadenopathy:    She has no cervical adenopathy.  Neurological: She is alert.    Assessment and Plan :   1. Essential hypertension  - CBC with Differential/Platelet - Comprehensive metabolic panel - TSH  2. Hypothyroidism, unspecified hypothyroidism type   3. Hyperlipidemia  - Lipid Panel With LDL/HDL Ratio  4. Epigastric pain   No weight  loss or any other alarm symptoms - US Abdomen Complete; Future - CBC with Differential/Platelet - Amylase - H. pylori antibody, IgG - Lipase - pantoprazole (PROTONIX) 40 MG tablet; Take 1 tablet (40 mg total) by mouth 2 (two) times daily.  Dispense: 180 tablet; Refill: 3  5. Pelvic pain in female  May need GYN referral - US Pelvis Complete; Future - US Transvaginal Non-OB; Future  6. Positive H. pylori test  - Ambulatory referral to Gastroenterology   Miguel Aschoff MD [facility] 11/28/2015 9:19  AM

## 2015-11-29 LAB — COMPREHENSIVE METABOLIC PANEL
A/G RATIO: 1.6 (ref 1.2–2.2)
ALK PHOS: 64 IU/L (ref 39–117)
ALT: 11 IU/L (ref 0–32)
AST: 21 IU/L (ref 0–40)
Albumin: 4.4 g/dL (ref 3.5–4.7)
BUN/Creatinine Ratio: 13 (ref 12–28)
BUN: 13 mg/dL (ref 8–27)
Bilirubin Total: 0.6 mg/dL (ref 0.0–1.2)
CALCIUM: 10 mg/dL (ref 8.7–10.3)
CO2: 29 mmol/L (ref 18–29)
CREATININE: 1.01 mg/dL — AB (ref 0.57–1.00)
Chloride: 99 mmol/L (ref 96–106)
GFR calc Af Amer: 60 mL/min/{1.73_m2} (ref >59)
GFR, EST NON AFRICAN AMERICAN: 52 mL/min/{1.73_m2} — AB (ref >59)
Globulin, Total: 2.8 g/dL (ref 1.5–4.5)
Glucose: 99 mg/dL (ref 65–99)
POTASSIUM: 4.2 mmol/L (ref 3.5–5.2)
Sodium: 143 mmol/L (ref 134–144)
Total Protein: 7.2 g/dL (ref 6.0–8.5)

## 2015-11-29 LAB — CBC WITH DIFFERENTIAL/PLATELET
Basophils Absolute: 0 10*3/uL (ref 0.0–0.2)
Basos: 1 %
EOS (ABSOLUTE): 0.2 10*3/uL (ref 0.0–0.4)
Eos: 3 %
Hematocrit: 40.8 % (ref 34.0–46.6)
Hemoglobin: 13.7 g/dL (ref 11.1–15.9)
IMMATURE GRANULOCYTES: 0 %
Immature Grans (Abs): 0 10*3/uL (ref 0.0–0.1)
Lymphocytes Absolute: 2 10*3/uL (ref 0.7–3.1)
Lymphs: 31 %
MCH: 29.7 pg (ref 26.6–33.0)
MCHC: 33.6 g/dL (ref 31.5–35.7)
MCV: 89 fL (ref 79–97)
MONOS ABS: 0.6 10*3/uL (ref 0.1–0.9)
Monocytes: 9 %
NEUTROS PCT: 56 %
Neutrophils Absolute: 3.5 10*3/uL (ref 1.4–7.0)
PLATELETS: 258 10*3/uL (ref 150–379)
RBC: 4.61 x10E6/uL (ref 3.77–5.28)
RDW: 13.4 % (ref 12.3–15.4)
WBC: 6.3 10*3/uL (ref 3.4–10.8)

## 2015-11-29 LAB — LIPASE: Lipase: 34 U/L (ref 0–59)

## 2015-11-29 LAB — LIPID PANEL WITH LDL/HDL RATIO
Cholesterol, Total: 149 mg/dL (ref 100–199)
HDL: 58 mg/dL (ref >39)
LDL CALC: 74 mg/dL (ref 0–99)
LDL/HDL RATIO: 1.3 ratio (ref 0.0–3.2)
TRIGLYCERIDES: 87 mg/dL (ref 0–149)
VLDL Cholesterol Cal: 17 mg/dL (ref 5–40)

## 2015-11-29 LAB — TSH: TSH: 7.6 u[IU]/mL — AB (ref 0.450–4.500)

## 2015-11-29 LAB — H. PYLORI ANTIBODY, IGG: H PYLORI IGG: 1.4 U/mL — AB (ref 0.0–0.8)

## 2015-11-29 LAB — AMYLASE: AMYLASE: 40 U/L (ref 31–124)

## 2015-11-29 MED ORDER — PANTOPRAZOLE SODIUM 40 MG PO TBEC: 40.0000 mg | DELAYED_RELEASE_TABLET | Freq: Two times a day (BID) | ORAL | Status: DC

## 2015-11-29 MED ORDER — LEVOTHYROXINE SODIUM 75 MCG PO TABS: 75.0000 ug | ORAL_TABLET | Freq: Every day | ORAL | Status: DC

## 2015-11-30 ENCOUNTER — Ambulatory Visit
Admission: RE | Admit: 2015-11-30 | Discharge: 2015-11-30 | Disposition: A | Discharge status: Home / Self Care | Payer: Commercial Managed Care - HMO | Source: Ambulatory Visit | Attending: Family Medicine | Admitting: Family Medicine

## 2015-11-30 DIAGNOSIS — K760 Fatty (change of) liver, not elsewhere classified: Secondary | ICD-10-CM | POA: Insufficient documentation

## 2015-11-30 DIAGNOSIS — R140 Abdominal distension (gaseous): Secondary | ICD-10-CM | POA: Diagnosis not present

## 2015-11-30 DIAGNOSIS — R1013 Epigastric pain: Secondary | ICD-10-CM | POA: Diagnosis not present

## 2015-11-30 DIAGNOSIS — K7689 Other specified diseases of liver: Secondary | ICD-10-CM | POA: Diagnosis not present

## 2015-11-30 DIAGNOSIS — R102 Pelvic and perineal pain: Secondary | ICD-10-CM | POA: Diagnosis not present

## 2015-11-30 MED ORDER — AMOXICILL-CLARITHRO-LANSOPRAZ PO MISC: ORAL | Status: DC

## 2015-12-01 ENCOUNTER — Other Ambulatory Visit: Payer: Self-pay

## 2015-12-01 MED ORDER — LANSOPRAZOLE 30 MG PO CPDR: 30.0000 mg | DELAYED_RELEASE_CAPSULE | Freq: Two times a day (BID) | ORAL | Status: DC

## 2015-12-01 MED ORDER — AMOXICILLIN 500 MG PO CAPS: ORAL_CAPSULE | ORAL | Status: DC

## 2015-12-01 MED ORDER — CLARITHROMYCIN 500 MG PO TABS
500.0000 mg | ORAL_TABLET | Freq: Two times a day (BID) | ORAL | Status: DC
Start: 2015-12-01 — End: 2015-12-22

## 2015-12-05 ENCOUNTER — Telehealth: Payer: Self-pay

## 2015-12-05 DIAGNOSIS — R102 Pelvic and perineal pain: Secondary | ICD-10-CM

## 2015-12-05 NOTE — Telephone Encounter (Signed)
Advised patient as below. Please refer patient to Dr. Enzo Bi for pelvic pain. Order has been placed. Thanks!

## 2015-12-05 NOTE — Telephone Encounter (Signed)
-----   Message from Jerrol Banana., MD sent at 12/02/2015  8:10 AM EDT ----- Refer to dr Enzo Bi just to  be complete/thorough. thx

## 2015-12-05 NOTE — Telephone Encounter (Signed)
Left message to call back  

## 2015-12-21 ENCOUNTER — Other Ambulatory Visit: Payer: Self-pay

## 2015-12-21 ENCOUNTER — Encounter: Payer: Self-pay | Admitting: Gastroenterology

## 2015-12-21 ENCOUNTER — Ambulatory Visit (INDEPENDENT_AMBULATORY_CARE_PROVIDER_SITE_OTHER): Payer: Commercial Managed Care - HMO | Admitting: Gastroenterology

## 2015-12-21 VITALS — BP 146/76 | HR 66 | Temp 97.8°F | Ht 61.5 in | Wt 176.0 lb

## 2015-12-21 DIAGNOSIS — G8929 Other chronic pain: Secondary | ICD-10-CM

## 2015-12-21 DIAGNOSIS — R1013 Epigastric pain: Secondary | ICD-10-CM | POA: Diagnosis not present

## 2015-12-21 DIAGNOSIS — R140 Abdominal distension (gaseous): Secondary | ICD-10-CM | POA: Diagnosis not present

## 2015-12-21 DIAGNOSIS — Q400 Congenital hypertrophic pyloric stenosis: Secondary | ICD-10-CM

## 2015-12-21 NOTE — Progress Notes (Signed)
Gastroenterology Consultation  Referring Provider:     Jerrol Banana.,* Primary Care Physician:  [name], MD Primary Gastroenterologist:  Dr. Allen Norris     Reason for Consultation:     Abdominal discomfort and bloating        HPI:   Lynn Williams is a 82 y.o. y/o female referred for consultation & management of Abdominal discomfort and bloating by Dr. [name], MD.  This patient comes in today with a history of back pain in the lower back that radiates around the front. The patient states this back pain is worse when she moves around and is doing exercise but better when she sits down it relaxes. The patient does report that she has some epigastric discomfort also. The patient was found to have H. pylori and was put on amoxicillin with erythromycin. The patient has early run out of the clarithromycin but still has 12 days left of the amoxicillin. On further investigation it turns out the patient was taking half the dose of the amoxicillin that she should have. The patient denies any unexplained weight loss and states that she is gaining weight. His last EGD and colonoscopy were over 9 years ago.  History reviewed. No pertinent past medical history.  Past Surgical History  Procedure Laterality Date  . Tubal ligation    . Breast lumpectomy Right   . Breast cyst aspiration    . Breast biopsy Right 1978    benign    Prior to Admission medications   Medication Sig Start Date End Date Taking? Authorizing Provider  ALPRAZolam Duanne Moron) 0.5 MG tablet Take 1 tablet (0.5 mg total) by mouth at bedtime. 08/25/15  Yes Richard Maceo Pro., MD  amoxicillin (AMOXIL) 500 MG capsule 2 tablets twice daily 12/01/15  Yes Richard Maceo Pro., MD  Calcium-Magnesium-Vitamin D (CALCIUM 500 PO) Take by mouth. 05/30/11  Yes Historical Provider, MD  cetirizine (ZYRTEC) 10 MG tablet Take by mouth. 06/07/14  Yes Historical Provider, MD  furosemide (LASIX) 20 MG tablet Take 1 tablet (20  mg total) by mouth daily. Patient taking differently: Take 20 mg by mouth daily as needed.  08/25/15  Yes Richard Maceo Pro., MD  hydrochlorothiazide (HYDRODIURIL) 25 MG tablet Take 1 tablet (25 mg total) by mouth daily. 08/25/15  Yes Richard Maceo Pro., MD  levothyroxine (SYNTHROID, LEVOTHROID) 75 MCG tablet Take 1 tablet (75 mcg total) by mouth daily before breakfast. 11/29/15  Yes Jerrol Banana., MD  Multiple Vitamins-Minerals (MULTIVITAMIN ADULT PO) Take by mouth. 05/30/11  Yes Historical Provider, MD  pantoprazole (PROTONIX) 40 MG tablet Take 1 tablet (40 mg total) by mouth 2 (two) times daily. 11/29/15  Yes Richard Maceo Pro., MD  simvastatin (ZOCOR) 20 MG tablet Take 1 tablet (20 mg total) by mouth at bedtime. 08/25/15  Yes Richard Maceo Pro., MD  clarithromycin (BIAXIN) 500 MG tablet Take 1 tablet (500 mg total) by mouth 2 (two) times daily. Patient not taking: Reported on 12/21/2015 12/01/15   Jerrol Banana., MD  lansoprazole (PREVACID) 30 MG capsule Take 1 capsule (30 mg total) by mouth 2 (two) times daily before a meal. Patient not taking: Reported on 12/21/2015 12/01/15   Jerrol Banana., MD    Family History  Problem Relation Age of Onset  . Hypertension Mother   . Heart attack Mother   . Stroke Mother   . Prostate cancer Father   . Heart attack  Brother   . Heart attack Sister   . Coronary artery disease Sister   . Stroke Sister   . Heart attack Sister   . Coronary artery disease Sister   . Alzheimer's disease Sister   . Diabetes Sister   . Hypertension Sister   . Heart disease Sister   . Heart disease Brother   . Heart attack Brother   . COPD Brother   . Heart disease Brother   . COPD Brother   . Breast cancer Neg Hx      Social History  Substance Use Topics  . Smoking status: Never Smoker   . Smokeless tobacco: Never Used  . Alcohol Use: No    Allergies as of 12/21/2015 - Review Complete 12/21/2015  Allergen Reaction Noted  . Sulfa  antibiotics  11/26/2014    Review of Systems:    All systems reviewed and negative except where noted in HPI.   Physical Exam:  BP 146/76 mmHg  Pulse 66  Temp(Src) 97.8 F (36.6 C) (Oral)  Ht 5' 1.5" (1.562 m)  Wt 176 lb (79.833 kg)  BMI 32.72 kg/m2  LMP  (LMP Unknown) No LMP recorded (lmp unknown). Patient is postmenopausal. Psych:  Alert and cooperative. Normal mood and affect. General:   Alert,  Well-developed, well-nourished, pleasant and cooperative in NAD Head:  Normocephalic and atraumatic. Eyes:  Sclera clear, no icterus.   Conjunctiva pink. Ears:  Normal auditory acuity. Nose:  No deformity, discharge, or lesions. Mouth:  No deformity or lesions,oropharynx pink & moist. Neck:  Supple; no masses or thyromegaly. Lungs:  Respirations even and unlabored.  Clear throughout to auscultation.   No wheezes, crackles, or rhonchi. No acute distress. Heart:  Regular rate and rhythm; no murmurs, clicks, rubs, or gallops. Abdomen:  Normal bowel sounds.  No bruits.  Soft, non-tender and non-distended without masses, hepatosplenomegaly or hernias noted.  No guarding or rebound tenderness.  Negative Carnett sign.   Rectal:  Deferred.  Msk:  Symmetrical without gross deformities.  Good, equal movement & strength bilaterally. Pulses:  Normal pulses noted. Extremities:  No clubbing or edema.  No cyanosis. Neurologic:  Alert and oriented x3;  grossly normal neurologically. Skin:  Intact without significant lesions or rashes.  No jaundice. Lymph Nodes:  No significant cervical adenopathy. Psych:  Alert and cooperative. Normal mood and affect.  Imaging Studies: US Abdomen Complete  11/30/2015  CLINICAL DATA:  Bloating EXAM: ABDOMEN ULTRASOUND COMPLETE COMPARISON:  None. FINDINGS: Gallbladder: No gallstones or wall thickening visualized. No sonographic Murphy sign noted by sonographer. Common bile duct: Diameter: 3.5 mm Liver: Increased echogenicity throughout the liver. No solid mass. Cyst in  the right lobe measures 2.5 x 2.4 x 2.1 cm. It is anechoic with a thin septation. IVC: No abnormality visualized. Pancreas: Obscured by overlying bowel gas.  Grossly without mass. Spleen: Size and appearance within normal limits. Right Kidney: Length: 10.4 cm. Echogenicity within normal limits. No mass or hydronephrosis visualized. Left Kidney: Length: 12.2 cm. Echogenicity within normal limits. No mass or hydronephrosis visualized. Abdominal aorta: Obscured by overlying bowel gas. Maximal visualized caliber is 2.3 cm. Other findings: None. IMPRESSION: Limited exam. Diffuse hepatic steatosis. Benign appearing 2.5 cm cyst in the right lobe of the liver. Electronically Signed   By: Marybelle Killings M.D.   On: 11/30/2015 10:48   US Transvaginal Non-ob  11/30/2015  CLINICAL DATA:  Pelvic pain for 3 months.  Pelvic bloating. EXAM: TRANSABDOMINAL AND TRANSVAGINAL ULTRASOUND OF PELVIS TECHNIQUE: Both transabdominal and transvaginal  ultrasound examinations of the pelvis were performed. Transabdominal technique was performed for global imaging of the pelvis including uterus, ovaries, adnexal regions, and pelvic cul-de-sac. It was necessary to proceed with endovaginal exam following the transabdominal exam to visualize the uterus, endometrium, ovaries, and adnexa. COMPARISON:  None FINDINGS: Uterus Measurements: 5.6 x 1.7 x 2.9 cm. No fibroids or other mass visualized. Endometrium Thickness: 1.1 cm. No endometrial mass or polyp. The endometrial canal is distended with fluid measuring 1.2 x 1.8 x 0.3 cm. Right ovary Not visualized Left ovary Not visualized Other findings No abnormal free fluid. IMPRESSION: Ovaries were not visualize limiting the exam. There is fluid in the endometrial canal. Obstruction at the level of the cervix is not excluded. Electronically Signed   By: Marybelle Killings M.D.   On: 11/30/2015 10:51   US Pelvis Complete  11/30/2015  CLINICAL DATA:  Pelvic pain for 3 months.  Pelvic bloating. EXAM:  TRANSABDOMINAL AND TRANSVAGINAL ULTRASOUND OF PELVIS TECHNIQUE: Both transabdominal and transvaginal ultrasound examinations of the pelvis were performed. Transabdominal technique was performed for global imaging of the pelvis including uterus, ovaries, adnexal regions, and pelvic cul-de-sac. It was necessary to proceed with endovaginal exam following the transabdominal exam to visualize the uterus, endometrium, ovaries, and adnexa. COMPARISON:  None FINDINGS: Uterus Measurements: 5.6 x 1.7 x 2.9 cm. No fibroids or other mass visualized. Endometrium Thickness: 1.1 cm. No endometrial mass or polyp. The endometrial canal is distended with fluid measuring 1.2 x 1.8 x 0.3 cm. Right ovary Not visualized Left ovary Not visualized Other findings No abnormal free fluid. IMPRESSION: Ovaries were not visualize limiting the exam. There is fluid in the endometrial canal. Obstruction at the level of the cervix is not excluded. Electronically Signed   By: Marybelle Killings M.D.   On: 11/30/2015 10:51    Assessment and Plan:   Lynn Williams is a 82 y.o. y/o female who comes in today with a history of bloating with epigastric discomfort and pain in the back that radiates to the front. The patient's back pain with radiation to the front is worse with movement and better with relaxing and most consistent with musculoskeletal pain. Due to the patient's bloating and epigastric discomfort she has been recommended to undergo an upper endoscopy. The patient has been told to stop the amoxicillin because she had not been taking the full dose and had a recent finished the clarithromycin. The patient will be set up for an EGD in 4 weeks to biopsy the stomach to make sure that the H. pylori has been eradicated although it is very doubtful. If the H. pylori is still present the patient will need to be retreated. The patient also wants to wait on any colonoscopy at this time to see what the upper endoscopy shows. The patient has been  explained the plan and agrees with it.   Note: This dictation was prepared with Dragon dictation along with smaller phrase technology. Any transcriptional errors that result from this process are unintentional.

## 2015-12-22 ENCOUNTER — Ambulatory Visit (INDEPENDENT_AMBULATORY_CARE_PROVIDER_SITE_OTHER): Payer: Commercial Managed Care - HMO | Admitting: Family Medicine

## 2015-12-22 VITALS — BP 130/70 | HR 72 | Temp 97.9°F | Resp 16 | Wt 177.0 lb

## 2015-12-22 DIAGNOSIS — J45909 Unspecified asthma, uncomplicated: Secondary | ICD-10-CM | POA: Diagnosis not present

## 2015-12-22 NOTE — Progress Notes (Signed)
Patient ID: MARTENE SARAGOZA, female   DOB: Jan 11, 1932, 81 y.o.   MRN: [id number]   LASHAUNDRA HARPER  MRN: [id number] DOB: Feb 20, 1932  Subjective:  HPI  The patient is an 82 year old female who presents for follow up of her abdominal and pelvic pain.  The patient had a positive H. Pylori test and was treated with the equivalent of a PrevPac.  She had misunderstood some of the directions and was only taking 1 Amoxicillin BID instead of 2.  She was seen by GI yesterday and is scheduled to have endoscopy on 01/16/16.  While at Dr. Dorothey Baseman office yesterday she told him how she was taking the medicine and he advised her at this point to d/c the Amoxicillin as it would not be helping her without the use of the other 2.  The patient reports that since taking the medicine the abdominal pain is improved. The patient also had a transvaginal ultrasound done on 11/30/15.  The ovaries were not visualized and the other findings were that there was fluid in the endometrial canal, but no abnormal free fluid.  The patient notes that the pelvic pain seems to be doing better.  The patient also complains of having 'the crud' for about a week.  She has had cough, wheezing, chest congestion, coughing up clear sputum, and she has been afebrile.  It is of note that her sister and now husband also had similar symptoms.  Patient Active Problem List   Diagnosis Date Noted  . Medicare annual wellness visit, subsequent 01/25/2015  . Allergic rhinitis 11/26/2014  . Basal cell carcinoma of nose 11/26/2014  . DD (diverticular disease) 11/26/2014  . Bloodgood disease 11/26/2014  . Essential (primary) hypertension 11/26/2014  . Hypercholesteremia 11/26/2014  . Adult hypothyroidism 11/26/2014  . Cannot sleep 11/26/2014  . Arthritis, degenerative 11/26/2014    No past medical history on file.  Social History   Social History  . Marital Status: Married    Spouse Name: N/A  . Number of Children: N/A  . Years of  Education: N/A   Occupational History  . Not on file.   Social History Main Topics  . Smoking status: Never Smoker   . Smokeless tobacco: Never Used  . Alcohol Use: No  . Drug Use: No  . Sexual Activity: Not on file   Other Topics Concern  . Not on file   Social History Narrative    Outpatient Prescriptions Prior to Visit  Medication Sig Dispense Refill  . ALPRAZolam (XANAX) 0.5 MG tablet Take 1 tablet (0.5 mg total) by mouth at bedtime. 90 tablet 1  . Calcium-Magnesium-Vitamin D (CALCIUM 500 PO) Take by mouth.    . cetirizine (ZYRTEC) 10 MG tablet Take by mouth.    . furosemide (LASIX) 20 MG tablet Take 1 tablet (20 mg total) by mouth daily. (Patient taking differently: Take 20 mg by mouth daily as needed. ) 90 tablet 3  . hydrochlorothiazide (HYDRODIURIL) 25 MG tablet Take 1 tablet (25 mg total) by mouth daily. 90 tablet 3  . levothyroxine (SYNTHROID, LEVOTHROID) 75 MCG tablet Take 1 tablet (75 mcg total) by mouth daily before breakfast. 90 tablet 3  . Multiple Vitamins-Minerals (MULTIVITAMIN ADULT PO) Take by mouth.    . pantoprazole (PROTONIX) 40 MG tablet Take 1 tablet (40 mg total) by mouth 2 (two) times daily. 180 tablet 3  . simvastatin (ZOCOR) 20 MG tablet Take 1 tablet (20 mg total) by mouth at bedtime. 90 tablet 3  .  amoxicillin (AMOXIL) 500 MG capsule 2 tablets twice daily 56 capsule 0  . clarithromycin (BIAXIN) 500 MG tablet Take 1 tablet (500 mg total) by mouth 2 (two) times daily. (Patient not taking: Reported on 12/21/2015) 28 tablet 0  . lansoprazole (PREVACID) 30 MG capsule Take 1 capsule (30 mg total) by mouth 2 (two) times daily before a meal. (Patient not taking: Reported on 12/21/2015) 28 capsule 0   No facility-administered medications prior to visit.    Allergies  Allergen Reactions  . Sulfa Antibiotics     Review of Systems  Constitutional: Negative for fever and malaise/fatigue.  Respiratory: Positive for cough, sputum production and wheezing.  Negative for shortness of breath.   Cardiovascular: Positive for leg swelling (left ankle). Negative for chest pain, palpitations, orthopnea, claudication and PND.  Gastrointestinal: Negative for heartburn, nausea, vomiting, abdominal pain, diarrhea, constipation, blood in stool and melena.  Neurological: Negative for dizziness, weakness and headaches.   Objective:  BP 130/70 mmHg  Pulse 72  Temp(Src) 97.9 F (36.6 C) (Oral)  Resp 16  Wt 177 lb (80.287 kg)  LMP  (LMP Unknown)  Physical Exam  Constitutional: She is oriented to person, place, and time and well-developed, well-nourished, and in no distress.  HENT:  Head: Normocephalic.  TMs dull  Eyes: Pupils are equal, round, and reactive to light.  Neck: Normal range of motion.  Cardiovascular: Normal rate, regular rhythm and normal heart sounds.   Pulmonary/Chest: She has wheezes (expiratory wheezes throughout that improved after nebulizer).  Neurological: She is alert and oriented to person, place, and time. Gait normal.  Skin: Skin is warm and dry.  Psychiatric: Mood, memory, affect and judgment normal.    Assessment and Plan :   1. Asthmatic bronchitis, unspecified asthma severity, uncomplicated Nebulizer treatment Patient improved with nebulizer.  Will give the patient Breo.  Have advised to Robitussin, rest and push fluids.  If patient does not improve will start Antibiotics and Prednisone.   Miguel Aschoff MD [facility] 12/22/2015 1:34 PM

## 2016-01-04 ENCOUNTER — Encounter: Payer: Self-pay | Admitting: Obstetrics and Gynecology

## 2016-01-12 ENCOUNTER — Encounter: Payer: Self-pay | Admitting: Obstetrics and Gynecology

## 2016-01-12 ENCOUNTER — Ambulatory Visit (INDEPENDENT_AMBULATORY_CARE_PROVIDER_SITE_OTHER): Payer: Commercial Managed Care - HMO | Admitting: Obstetrics and Gynecology

## 2016-01-12 VITALS — BP 130/77 | HR 69 | Ht 61.6 in | Wt 173.5 lb

## 2016-01-12 DIAGNOSIS — R935 Abnormal findings on diagnostic imaging of other abdominal regions, including retroperitoneum: Secondary | ICD-10-CM | POA: Diagnosis not present

## 2016-01-12 DIAGNOSIS — N858 Other specified noninflammatory disorders of uterus: Secondary | ICD-10-CM

## 2016-01-12 DIAGNOSIS — R102 Pelvic and perineal pain: Secondary | ICD-10-CM | POA: Insufficient documentation

## 2016-01-12 DIAGNOSIS — N882 Stricture and stenosis of cervix uteri: Secondary | ICD-10-CM | POA: Insufficient documentation

## 2016-01-12 LAB — POCT URINALYSIS DIPSTICK
BILIRUBIN UA: NEGATIVE
Blood, UA: NEGATIVE
GLUCOSE UA: NEGATIVE
KETONES UA: NEGATIVE
LEUKOCYTES UA: NEGATIVE
Nitrite, UA: NEGATIVE
PH UA: 6
Protein, UA: NEGATIVE
Spec Grav, UA: 1.01
Urobilinogen, UA: NEGATIVE

## 2016-01-12 NOTE — Progress Notes (Signed)
GYN ENCOUNTER NOTE  Subjective:       Lynn Williams is a 82 y.o. No obstetric history on file. female is here for gynecologic evaluation of the following issues:  1.Abdominal pelvic pain  Several month history of left-sided abdominal pelvic pain which is a 8-like and crampy. No significant change in bowel function. No painful bowel movements. Patient was recently treated for possible H. pylori and is to undergo endoscopy on 01/16/2016. Colonoscopy may be performed after that. Workup for this condition has included ultrasound on 11/30/2015 which demonstrated nonvisualization of the ovaries and a fluid collection within the endometrial cavity measuring 3 mm with an endometrial stripe tissue thickness of 1.1 mm  Patient has not had any children. She has never been on hormone replacement therapy. She does not have any vaginal bleeding or vaginal discharge..     Gynecologic History No LMP recorded (lmp unknown). Patient is postmenopausal. Contraception: post menopausal status   Obstetric History OB History  No data available    Past Medical History  Diagnosis Date  . Hypertension     controlled on meds  . Cough     chronic/allergies  . Arthritis     hands and wrist and knees  . Hypothyroidism   . Wears partial dentures     upper  . GERD (gastroesophageal reflux disease)     Past Surgical History  Procedure Laterality Date  . Tubal ligation    . Breast lumpectomy Right   . Breast cyst aspiration    . Breast biopsy Right 1978    benign  . Cataract extraction Bilateral     Current Outpatient Prescriptions on File Prior to Visit  Medication Sig Dispense Refill  . ALPRAZolam (XANAX) 0.5 MG tablet Take 1 tablet (0.5 mg total) by mouth at bedtime. (Patient taking differently: Take 0.5 mg by mouth as needed. ) 90 tablet 1  . Calcium-Magnesium-Vitamin D (CALCIUM 500 PO) Take by mouth. am    . cetirizine (ZYRTEC) 10 MG tablet Take by mouth as needed.     . furosemide (LASIX)  20 MG tablet Take 1 tablet (20 mg total) by mouth daily. (Patient taking differently: Take 20 mg by mouth daily as needed. ) 90 tablet 3  . hydrochlorothiazide (HYDRODIURIL) 25 MG tablet Take 1 tablet (25 mg total) by mouth daily. (Patient taking differently: Take 25 mg by mouth daily. am) 90 tablet 3  . levothyroxine (SYNTHROID, LEVOTHROID) 75 MCG tablet Take 1 tablet (75 mcg total) by mouth daily before breakfast. 90 tablet 3  . Multiple Vitamins-Minerals (MULTIVITAMIN ADULT PO) Take by mouth. am    . pantoprazole (PROTONIX) 40 MG tablet Take 1 tablet (40 mg total) by mouth 2 (two) times daily. (Patient taking differently: Take 40 mg by mouth 2 (two) times daily. Am and before dinner) 180 tablet 3  . simvastatin (ZOCOR) 20 MG tablet Take 1 tablet (20 mg total) by mouth at bedtime. 90 tablet 3   No current facility-administered medications on file prior to visit.    Allergies  Allergen Reactions  . Sulfa Antibiotics Itching    Social History   Social History  . Marital Status: Married    Spouse Name: N/A  . Number of Children: N/A  . Years of Education: N/A   Occupational History  . Not on file.   Social History Main Topics  . Smoking status: Never Smoker   . Smokeless tobacco: Never Used  . Alcohol Use: No  . Drug Use: No  .  Sexual Activity: Not on file   Other Topics Concern  . Not on file   Social History Narrative    Family History  Problem Relation Age of Onset  . Hypertension Mother   . Heart attack Mother   . Stroke Mother   . Prostate cancer Father   . Heart attack Brother   . Heart attack Sister   . Coronary artery disease Sister   . Stroke Sister   . Heart attack Sister   . Coronary artery disease Sister   . Alzheimer's disease Sister   . Diabetes Sister   . Hypertension Sister   . Heart disease Sister   . Heart disease Brother   . Heart attack Brother   . COPD Brother   . Heart disease Brother   . COPD Brother   . Breast cancer Neg Hx     The  following portions of the patient's history were reviewed and updated as appropriate: allergies, current medications, past family history, past medical history, past social history, past surgical history and problem list.  Review of Systems Per history of present illness  Objective:   BP 130/77 mmHg  Pulse 69  Ht 5' 1.6" (1.565 m)  Wt 173 lb 8 oz (78.699 kg)  BMI 32.13 kg/m2  LMP  (LMP Unknown) CONSTITUTIONAL: Well-developed, well-nourished female in no acute distress.  HENT:  Normocephalic, atraumatic.  NECK: Normal range of motion, supple, no masses.  Normal thyroid.  SKIN: Skin is warm and dry. No rash noted. Not diaphoretic. No erythema. No pallor. [place]: Alert and oriented to person, place, and time. PSYCHIATRIC: Normal mood and affect. Normal behavior. Normal judgment and thought content. CARDIOVASCULAR:Not Examined RESPIRATORY: Not Examined BREASTS: Not Examined ABDOMEN: Soft, non distended; Non tender.  No Organomegaly. PELVIC:  External Genitalia: Normal  BUS: Normal  Vagina: Normal  Cervix: Normal  Uterus: Small, mobile  Adnexa: Normal  RV: Normal external exam  Bladder: Nontender MUSCULOSKELETAL: Normal range of motion. No tenderness.  No cyanosis, clubbing, or edema.  PROCEDURE: Dilation of the endocervical canal and endometrial biopsy  Endometrial Biopsy Procedure Note  Pre-operative Diagnosis: Abdominal pain; abnormal ultrasound with endometrial fluid collection  Post-operative Diagnosis: Same  Indications: Pelvic ultrasound demonstrated 3.1 mm fluid collection within the endometrial cavity and a total endometrial stripe measuring 1.1 mm. Because the patient was experiencing abdominal pelvic pain and undergone for this condition, endometrial biopsy was entertained.  Procedure Details   Urine pregnancy test was not done.  The risks (including infection, bleeding, pain, and uterine perforation) and benefits of the procedure were explained to the patient and  Verbal informed consent was obtained.  Antibiotic prophylaxis against endocarditis was not indicated.   The patient was placed in the dorsal lithotomy position.  Bimanual exam showed the uterus to be in the neutral position.  A Graves' speculum inserted in the vagina, and the cervix prepped with povidone iodine.  Endocervical curettage with a Kevorkian curette was not performed.   A sharp tenaculum was applied to the anterior lip of the cervix for stabilization. Lacrimal duct probes were used to dilate the stenotic cervical os. A 3 mm Mylex pipette sound was used to sound the uterus to a depth of 4cm.  A Mylex 42mm curette was used to sample the endometrium. Scant yellow mucus was obtained with 2 passes of the Mylex curette. Sample was sent for pathologic examination.  Condition: Stable  Complications: None  Plan:  The patient was advised to call for any fever or for  prolonged or severe pain or bleeding. She was advised to use OTC acetaminophen and OTC ibuprofen as needed for mild to moderate pain. She was advised to avoid vaginal intercourse for 48 hours or until the bleeding has completely stopped.  Attending Physician Documentation: Brayton Mars, MD   Assessment:   1. Pelvic pain in female - POCT urinalysis dipstick   2. Abnormal ultrasound demonstrating fluid collection within the endometrial cavity. Sampling was notable for scant yellow mucus on 2 pipette passes  3. Patient's symptoms are likely NOT related to this ultrasound finding.  4. Cervical stenosis  Plan:   1. Dilation of the endocervical canal 2. Endometrial biopsy 3. Patient be notified of results  Brayton Mars, MD  Note: This dictation was prepared with Dragon dictation along with smaller phrase technology. Any transcriptional errors that result from this process are unintentional.

## 2016-01-12 NOTE — Patient Instructions (Signed)
1. Endometrial biopsy is completed 2. Results will be made available 3. Follow-up as needed  Endometrial Biopsy, Care After Refer to this sheet in the next few weeks. These instructions provide you with information on caring for yourself after your procedure. Your health care provider may also give you more specific instructions. Your treatment has been planned according to current medical practices, but problems sometimes occur. Call your health care provider if you have any problems or questions after your procedure. WHAT TO EXPECT AFTER THE PROCEDURE After your procedure, it is typical to have the following:  You may have mild cramping and a small amount of vaginal bleeding for a few days after the procedure. This is normal. HOME CARE INSTRUCTIONS  Only take over-the-counter or prescription medicine as directed by your health care provider.  Do not douche, use tampons, or have sexual intercourse until your health care provider approves.  Follow your health care provider's instructions regarding any activity restrictions, such as strenuous exercise or heavy lifting. SEEK MEDICAL CARE IF:  You have heavy bleeding or bleeding longer than 2 days after the procedure.  You have bad smelling drainage from your vagina.  You have a fever and chills.  Youhave severe lower stomach (abdominal) pain. SEEK IMMEDIATE MEDICAL CARE IF:  You have severe cramps in your stomach or back.  You pass large blood clots.  Your bleeding increases.  You become weak or lightheaded, or you pass out.   This information is not intended to replace advice given to you by your health care provider. Make sure you discuss any questions you have with your health care provider.   Document Released: 04/29/2013 Document Reviewed: 04/29/2013 Elsevier Interactive Patient Education [employer].

## 2016-01-12 NOTE — Discharge Instructions (Signed)

## 2016-01-16 ENCOUNTER — Encounter
Admission: RE | Disposition: A | Discharge status: Home / Self Care | Payer: Self-pay | Source: Ambulatory Visit | Attending: Gastroenterology

## 2016-01-16 ENCOUNTER — Ambulatory Visit
Admission: RE | Admit: 2016-01-16 | Discharge: 2016-01-16 | Disposition: A | Discharge status: Home / Self Care | Payer: Commercial Managed Care - HMO | Source: Ambulatory Visit | Attending: Gastroenterology | Admitting: Gastroenterology

## 2016-01-16 ENCOUNTER — Ambulatory Visit: Payer: Commercial Managed Care - HMO | Admitting: Anesthesiology

## 2016-01-16 DIAGNOSIS — K219 Gastro-esophageal reflux disease without esophagitis: Secondary | ICD-10-CM | POA: Insufficient documentation

## 2016-01-16 DIAGNOSIS — I10 Essential (primary) hypertension: Secondary | ICD-10-CM | POA: Diagnosis not present

## 2016-01-16 DIAGNOSIS — R1013 Epigastric pain: Secondary | ICD-10-CM | POA: Diagnosis not present

## 2016-01-16 DIAGNOSIS — K2970 Gastritis, unspecified, without bleeding: Secondary | ICD-10-CM | POA: Diagnosis not present

## 2016-01-16 DIAGNOSIS — M1990 Unspecified osteoarthritis, unspecified site: Secondary | ICD-10-CM | POA: Insufficient documentation

## 2016-01-16 DIAGNOSIS — Z882 Allergy status to sulfonamides status: Secondary | ICD-10-CM | POA: Insufficient documentation

## 2016-01-16 DIAGNOSIS — J45909 Unspecified asthma, uncomplicated: Secondary | ICD-10-CM | POA: Diagnosis not present

## 2016-01-16 DIAGNOSIS — K2950 Unspecified chronic gastritis without bleeding: Secondary | ICD-10-CM | POA: Insufficient documentation

## 2016-01-16 DIAGNOSIS — K2930 Chronic superficial gastritis without bleeding: Secondary | ICD-10-CM | POA: Diagnosis not present

## 2016-01-16 DIAGNOSIS — Z79899 Other long term (current) drug therapy: Secondary | ICD-10-CM | POA: Diagnosis not present

## 2016-01-16 DIAGNOSIS — G8929 Other chronic pain: Secondary | ICD-10-CM | POA: Diagnosis not present

## 2016-01-16 DIAGNOSIS — R140 Abdominal distension (gaseous): Secondary | ICD-10-CM | POA: Diagnosis not present

## 2016-01-16 DIAGNOSIS — E039 Hypothyroidism, unspecified: Secondary | ICD-10-CM | POA: Diagnosis not present

## 2016-01-16 DIAGNOSIS — F419 Anxiety disorder, unspecified: Secondary | ICD-10-CM | POA: Diagnosis not present

## 2016-01-16 DIAGNOSIS — K449 Diaphragmatic hernia without obstruction or gangrene: Secondary | ICD-10-CM | POA: Diagnosis not present

## 2016-01-16 HISTORY — DX: Gastro-esophageal reflux disease without esophagitis: K21.9

## 2016-01-16 HISTORY — DX: Cough, unspecified: R05.9

## 2016-01-16 HISTORY — DX: Cough: R05

## 2016-01-16 HISTORY — DX: Essential (primary) hypertension: I10

## 2016-01-16 HISTORY — DX: Hypothyroidism, unspecified: E03.9

## 2016-01-16 HISTORY — DX: Presence of dental prosthetic device (complete) (partial): Z97.2

## 2016-01-16 HISTORY — DX: Unspecified osteoarthritis, unspecified site: M19.90

## 2016-01-16 HISTORY — PX: ESOPHAGOGASTRODUODENOSCOPY (EGD) WITH PROPOFOL: SHX5813

## 2016-01-16 LAB — PATHOLOGY

## 2016-01-16 SURGERY — ESOPHAGOGASTRODUODENOSCOPY (EGD) WITH PROPOFOL
Anesthesia: Monitor Anesthesia Care | Wound class: Clean Contaminated

## 2016-01-16 MED ORDER — OXYCODONE HCL 5 MG/5ML PO SOLN: 5.0000 mg | Freq: Once | ORAL | Status: DC | PRN

## 2016-01-16 MED ORDER — OXYCODONE HCL 5 MG PO TABS: 5.0000 mg | ORAL_TABLET | Freq: Once | ORAL | Status: DC | PRN

## 2016-01-16 MED ORDER — GLYCOPYRROLATE 0.2 MG/ML IJ SOLN
INTRAMUSCULAR | Status: DC | PRN
  Administered 2016-01-16: 0.1 mg via INTRAVENOUS

## 2016-01-16 MED ORDER — PROPOFOL 10 MG/ML IV BOLUS
INTRAVENOUS | Status: DC | PRN
  Administered 2016-01-16: 40 mg via INTRAVENOUS
  Administered 2016-01-16: 20 mg via INTRAVENOUS

## 2016-01-16 MED ORDER — LIDOCAINE HCL (CARDIAC) 20 MG/ML IV SOLN
INTRAVENOUS | Status: DC | PRN
  Administered 2016-01-16: 50 mg via INTRAVENOUS

## 2016-01-16 MED ORDER — STERILE WATER FOR IRRIGATION IR SOLN
Status: DC | PRN
  Administered 2016-01-16: 11:00:00

## 2016-01-16 MED ORDER — GLYCOPYRROLATE 0.2 MG/ML IJ SOLN: INTRAMUSCULAR | Status: DC | PRN

## 2016-01-16 MED ORDER — LACTATED RINGERS IV SOLN
INTRAVENOUS | Status: DC
  Administered 2016-01-16: 10:00:00 via INTRAVENOUS

## 2016-01-16 SURGICAL SUPPLY — 32 items
BALLN DILATOR 10-12 8 (BALLOONS)
BALLN DILATOR 12-15 8 (BALLOONS)
BALLN DILATOR 15-18 8 (BALLOONS)
BALLN DILATOR CRE 0-12 8 (BALLOONS)
BALLN DILATOR ESOPH 8 10 CRE (MISCELLANEOUS) IMPLANT
BALLOON DILATOR 12-15 8 (BALLOONS) IMPLANT
BALLOON DILATOR 15-18 8 (BALLOONS) IMPLANT
BALLOON DILATOR CRE 0-12 8 (BALLOONS) IMPLANT
BLOCK BITE 60FR ADLT L/F GRN (MISCELLANEOUS) ×3 IMPLANT
CANISTER SUCT 1200ML W/VALVE (MISCELLANEOUS) ×3 IMPLANT
CLIP HMST 235XBRD CATH ROT (MISCELLANEOUS) IMPLANT
CLIP RESOLUTION 360 11X235 (MISCELLANEOUS)
FCP ESCP3.2XJMB 240X2.8X (MISCELLANEOUS)
FORCEPS BIOP RAD 4 LRG CAP 4 (CUTTING FORCEPS) ×2 IMPLANT
FORCEPS BIOP RJ4 240 W/NDL (MISCELLANEOUS)
FORCEPS ESCP3.2XJMB 240X2.8X (MISCELLANEOUS) IMPLANT
GOWN CVR UNV OPN BCK APRN NK (MISCELLANEOUS) ×2 IMPLANT
GOWN ISOL THUMB LOOP REG UNIV (MISCELLANEOUS) ×6
INJECTOR VARIJECT VIN23 (MISCELLANEOUS) IMPLANT
KIT DEFENDO VALVE AND CONN (KITS) IMPLANT
KIT ENDO PROCEDURE OLY (KITS) ×3 IMPLANT
MARKER SPOT ENDO TATTOO 5ML (MISCELLANEOUS) IMPLANT
PAD GROUND ADULT SPLIT (MISCELLANEOUS) IMPLANT
RETRIEVER NET PLAT FOOD (MISCELLANEOUS) IMPLANT
SNARE SHORT THROW 13M SML OVAL (MISCELLANEOUS) IMPLANT
SNARE SHORT THROW 30M LRG OVAL (MISCELLANEOUS) IMPLANT
SPOT EX ENDOSCOPIC TATTOO (MISCELLANEOUS)
SYR INFLATION 60ML (SYRINGE) IMPLANT
TRAP ETRAP POLY (MISCELLANEOUS) IMPLANT
VARIJECT INJECTOR VIN23 (MISCELLANEOUS)
WATER STERILE IRR 250ML POUR (IV SOLUTION) ×3 IMPLANT
WIRE CRE 18-20MM 8CM F G (MISCELLANEOUS) IMPLANT

## 2016-01-16 NOTE — H&P (Signed)
Lynn Lame, MD [address] [place], [place] [address] Phone: [phone] Fax : [phone]  Primary Care Physician:  [name], MD Primary Gastroenterologist:  Dr. Allen Norris  Pre-Procedure History & Physical: HPI:  Lynn Williams is a 82 y.o. female is here for an endoscopy.   Past Medical History  Diagnosis Date  . Hypertension     controlled on meds  . Cough     chronic/allergies  . Arthritis     hands and wrist and knees  . Hypothyroidism   . Wears partial dentures     upper  . GERD (gastroesophageal reflux disease)     Past Surgical History  Procedure Laterality Date  . Tubal ligation    . Breast lumpectomy Right   . Breast cyst aspiration    . Breast biopsy Right 1978    benign  . Cataract extraction Bilateral     Prior to Admission medications   Medication Sig Start Date End Date Taking? Authorizing Provider  ALPRAZolam Duanne Moron) 0.5 MG tablet Take 1 tablet (0.5 mg total) by mouth at bedtime. Patient taking differently: Take 0.5 mg by mouth as needed.  08/25/15  Yes Richard Maceo Pro., MD  Calcium-Magnesium-Vitamin D (CALCIUM 500 PO) Take by mouth. am 05/30/11  Yes Historical Provider, MD  cetirizine (ZYRTEC) 10 MG tablet Take by mouth as needed.  06/07/14  Yes Historical Provider, MD  furosemide (LASIX) 20 MG tablet Take 1 tablet (20 mg total) by mouth daily. Patient taking differently: Take 20 mg by mouth daily as needed.  08/25/15  Yes Richard Maceo Pro., MD  hydrochlorothiazide (HYDRODIURIL) 25 MG tablet Take 1 tablet (25 mg total) by mouth daily. Patient taking differently: Take 25 mg by mouth daily. am 08/25/15  Yes Richard Maceo Pro., MD  levothyroxine (SYNTHROID, LEVOTHROID) 75 MCG tablet Take 1 tablet (75 mcg total) by mouth daily before breakfast. 11/29/15  Yes Jerrol Banana., MD  Multiple Vitamins-Minerals (MULTIVITAMIN ADULT PO) Take by mouth. am 05/30/11  Yes Historical Provider, MD  pantoprazole (PROTONIX) 40 MG  tablet Take 1 tablet (40 mg total) by mouth 2 (two) times daily. Patient taking differently: Take 40 mg by mouth 2 (two) times daily. Am and before dinner 11/29/15  Yes Richard Maceo Pro., MD  simvastatin (ZOCOR) 20 MG tablet Take 1 tablet (20 mg total) by mouth at bedtime. 08/25/15  Yes Richard Maceo Pro., MD    Allergies as of 12/21/2015 - Review Complete 12/21/2015  Allergen Reaction Noted  . Sulfa antibiotics  11/26/2014    Family History  Problem Relation Age of Onset  . Hypertension Mother   . Heart attack Mother   . Stroke Mother   . Prostate cancer Father   . Heart attack Brother   . Heart attack Sister   . Coronary artery disease Sister   . Stroke Sister   . Heart attack Sister   . Coronary artery disease Sister   . Alzheimer's disease Sister   . Diabetes Sister   . Hypertension Sister   . Heart disease Sister   . Heart disease Brother   . Heart attack Brother   . COPD Brother   . Heart disease Brother   . COPD Brother   . Breast cancer Neg Hx     Social History   Social History  . Marital Status: Married    Spouse Name: N/A  . Number of Children: N/A  . Years of Education: N/A  Occupational History  . [employer] on file.   Social History Main Topics  . Smoking status: Never Smoker   . Smokeless tobacco: Never Used  . Alcohol Use: No  . Drug Use: No  . Sexual Activity: Lynn on file   Other Topics Concern  . Lynn on file   Social History Narrative    Review of Systems: See HPI, otherwise negative ROS  Physical Exam: BP 147/65 mmHg  Pulse 69  Temp(Src) 97.7 F (36.5 C) (Temporal)  Resp 16  Ht 5\' 1"  (1.549 m)  Wt 172 lb (78.019 kg)  BMI 32.52 kg/m2  SpO2 95%  LMP  (LMP Unknown) General:   Alert,  pleasant and cooperative in NAD Head:  Normocephalic and atraumatic. Neck:  Supple; no masses or thyromegaly. Lungs:  Clear throughout to auscultation.    Heart:  Regular rate and rhythm. Abdomen:  Soft, nontender and nondistended. Normal bowel  sounds, without guarding, and without rebound.   Neurologic:  Alert and  oriented x4;  grossly normal neurologically.  Impression/Plan: [name] is here for an endoscopy to be performed for epigastric discomfort.  Risks, benefits, limitations, and alternatives regarding  endoscopy have been reviewed with the patient.  Questions have been answered.  All parties agreeable.   Lynn Lame, MD  01/16/2016, 10:28 AM

## 2016-01-16 NOTE — Op Note (Signed)
[facility] Gastroenterology Patient Name: Lynn Williams Procedure Date: 01/16/2016 10:54 AM MRN: [id number] Account #: [id number] Date of Birth: 1932/07/17 Admit Type: Outpatient Age: 82 Room: [facility] OR ROOM 01 Gender: Female Note Status: Finalized Procedure:            Upper GI endoscopy Indications:          Epigastric abdominal pain Providers:            Lucilla Lame, MD Referring MD:         Janine Ores. Rosanna Randy, MD (Referring MD) Medicines:            Propofol per Anesthesia Complications:        No immediate complications. Procedure:            Pre-Anesthesia Assessment:                       - Prior to the procedure, a History and Physical was                        performed, and patient medications and allergies were                        reviewed. The patient's tolerance of previous                        anesthesia was also reviewed. The risks and benefits of                        the procedure and the sedation options and risks were                        discussed with the patient. All questions were                        answered, and informed consent was obtained. Prior                        Anticoagulants: The patient has taken no previous                        anticoagulant or antiplatelet agents. ASA Grade                        Assessment: II - A patient with mild systemic disease.                        After reviewing the risks and benefits, the patient was                        deemed in satisfactory condition to undergo the                        procedure.                       After obtaining informed consent, the endoscope was                        passed under direct vision. Throughout the procedure,  the patient's blood pressure, pulse, and oxygen                        saturations were monitored continuously. The Olympus                        [id number]-HQ190 Endoscope (S#. [id number]) was introduced           through the mouth, and advanced to the second part of                        duodenum. The upper GI endoscopy was accomplished                        without difficulty. The patient tolerated the procedure                        well. Findings:      A small hiatal hernia was present.      Localized minimal inflammation characterized by erythema was found in       the gastric antrum. Biopsies were taken with a cold forceps for       histology.      The examined duodenum was normal. Impression:           - Small hiatal hernia.                       - Gastritis. Biopsied.                       - Normal examined duodenum. Recommendation:       - Await pathology results. Procedure Code(s):    --- Professional ---                       [phone], Esophagogastroduodenoscopy, flexible, transoral;                        with biopsy, single or multiple Diagnosis Code(s):    --- Professional ---                       R10.13, Epigastric pain                       K29.70, Gastritis, unspecified, without bleeding                       K44.9, Diaphragmatic hernia without obstruction or                        gangrene CPT copyright 2016 American Medical Association. All rights reserved. The codes documented in this report are preliminary and upon coder review may  be revised to meet current compliance requirements. Lucilla Lame, MD 01/16/2016 11:10:46 AM This report has been signed electronically. Number of Addenda: 0 Note Initiated On: 01/16/2016 10:54 AM Total Procedure Duration: 0 hours 1 minute 54 seconds       [facility]

## 2016-01-16 NOTE — Anesthesia Postprocedure Evaluation (Addendum)
Anesthesia Post Note  Patient: Lynn Williams  Procedure(s) Performed: Procedure(s) (LRB): ESOPHAGOGASTRODUODENOSCOPY (EGD) WITH PROPOFOL (N/A)  Patient location during evaluation: PACU Anesthesia Type: MAC Level of consciousness: awake and alert Pain management: pain level controlled Vital Signs Assessment: post-procedure vital signs reviewed and stable Respiratory status: spontaneous breathing, nonlabored ventilation, respiratory function stable and patient connected to nasal cannula oxygen Cardiovascular status: stable and blood pressure returned to baseline Anesthetic complications: no    Frieda Arnall

## 2016-01-16 NOTE — Anesthesia Preprocedure Evaluation (Signed)
Anesthesia Evaluation  Patient identified by MRN, date of birth, ID band  Reviewed: NPO status   History of Anesthesia Complications Negative for: history of anesthetic complications  Airway Mallampati: II  TM Distance: >3 FB Neck ROM: full    Dental no notable dental hx. (+) Upper Dentures   Pulmonary asthma ,    Pulmonary exam normal        Cardiovascular Exercise Tolerance: Good hypertension, Normal cardiovascular exam     Neuro/Psych Anxiety negative neurological ROS     GI/Hepatic Neg liver ROS, GERD  Controlled,  Endo/Other  Hypothyroidism   Renal/GU negative Renal ROS  negative genitourinary   Musculoskeletal  (+) Arthritis ,   Abdominal   Peds  Hematology negative hematology ROS (+)   Anesthesia Other Findings   Reproductive/Obstetrics                             Anesthesia Physical Anesthesia Plan  ASA: II  Anesthesia Plan: MAC   Post-op Pain Management:    Induction:   Airway Management Planned:   Additional Equipment:   Intra-op Plan:   Post-operative Plan:   Informed Consent: I have reviewed the patients History and Physical, chart, labs and discussed the procedure including the risks, benefits and alternatives for the proposed anesthesia with the patient or authorized representative who has indicated his/her understanding and acceptance.     Plan Discussed with: CRNA  Anesthesia Plan Comments:         Anesthesia Quick Evaluation

## 2016-01-16 NOTE — Anesthesia Procedure Notes (Signed)
Procedure Name: MAC Date/Time: 01/16/2016 11:02 AM Performed by: Cameron Ali Pre-anesthesia Checklist: Patient identified, Emergency Drugs available, Suction available, Timeout performed and Patient being monitored Patient Re-evaluated:Patient Re-evaluated prior to inductionOxygen Delivery Method: Nasal cannula Placement Confirmation: positive ETCO2

## 2016-01-16 NOTE — Addendum Note (Signed)
Addendum  created 01/16/16 1127 by Fidel Levy, MD   Modules edited: Clinical Notes   Clinical Notes:  File: [id number]

## 2016-01-16 NOTE — Transfer of Care (Signed)
Immediate Anesthesia Transfer of Care Note  Patient: Lynn Williams  Procedure(s) Performed: Procedure(s): ESOPHAGOGASTRODUODENOSCOPY (EGD) WITH PROPOFOL (N/A)  Patient Location: PACU  Anesthesia Type: MAC  Level of Consciousness: awake, alert  and patient cooperative  Airway and Oxygen Therapy: Patient Spontanous Breathing and Patient connected to supplemental oxygen  Post-op Assessment: Post-op Vital signs reviewed, Patient's Cardiovascular Status Stable, Respiratory Function Stable, Patent Airway and No signs of Nausea or vomiting  Post-op Vital Signs: Reviewed and stable  Complications: No apparent anesthesia complications

## 2016-01-17 ENCOUNTER — Encounter: Payer: Self-pay | Admitting: Gastroenterology

## 2016-01-19 ENCOUNTER — Encounter: Payer: Self-pay | Admitting: Gastroenterology

## 2016-01-20 ENCOUNTER — Encounter: Payer: Self-pay | Admitting: Gastroenterology

## 2016-01-30 ENCOUNTER — Telehealth: Payer: Self-pay | Admitting: Family Medicine

## 2016-01-30 NOTE — Telephone Encounter (Signed)
Wanted to know endoscopy results, went over this information with patient per records we have-aa

## 2016-01-30 NOTE — Telephone Encounter (Signed)
Pt is calling to request results from test.  [phone]/MW

## 2016-05-14 ENCOUNTER — Ambulatory Visit (INDEPENDENT_AMBULATORY_CARE_PROVIDER_SITE_OTHER): Payer: Commercial Managed Care - HMO | Admitting: Physician Assistant

## 2016-05-14 ENCOUNTER — Ambulatory Visit
Admission: RE | Admit: 2016-05-14 | Discharge: 2016-05-14 | Disposition: A | Discharge status: Home / Self Care | Payer: Commercial Managed Care - HMO | Source: Ambulatory Visit | Attending: Physician Assistant | Admitting: Physician Assistant

## 2016-05-14 ENCOUNTER — Encounter: Payer: Self-pay | Admitting: Physician Assistant

## 2016-05-14 ENCOUNTER — Telehealth: Payer: Self-pay

## 2016-05-14 VITALS — BP 142/70 | HR 68 | Temp 97.9°F | Resp 16 | Wt 172.0 lb

## 2016-05-14 DIAGNOSIS — Z23 Encounter for immunization: Secondary | ICD-10-CM

## 2016-05-14 DIAGNOSIS — M545 Low back pain, unspecified: Secondary | ICD-10-CM

## 2016-05-14 DIAGNOSIS — H6121 Impacted cerumen, right ear: Secondary | ICD-10-CM | POA: Diagnosis not present

## 2016-05-14 DIAGNOSIS — M5136 Other intervertebral disc degeneration, lumbar region: Secondary | ICD-10-CM | POA: Insufficient documentation

## 2016-05-14 NOTE — Progress Notes (Signed)
Patient: Lynn Williams Female    DOB: [birth year]   82 y.o.   MRN: [id number] Visit Date: 05/14/2016  Today's Provider: Trinna Post, PA-C   Chief Complaint  Patient presents with  . Ear Fullness    Right side  . Back Pain    Lower left side   Subjective:    Ear Fullness   There is pain in the right ear. This is a recurrent problem. The current episode started in the past 7 days. Associated symptoms include coughing. Pertinent negatives include no abdominal pain, ear discharge, headaches, neck pain or sore throat.  Back Pain  This is a new (Pt was helping her sister bring in a case of water. ) problem. The current episode started in the past 7 days. The problem has been gradually improving since onset. The pain is present in the lumbar spine (Left side). The pain does not radiate. The pain is at a severity of 5/10 (Dull ache). The symptoms are aggravated by twisting and position. Pertinent negatives include no abdominal pain, bladder incontinence, bowel incontinence, headaches, leg pain, numbness, paresis, paresthesias, tingling or weakness. The treatment provided mild relief.   Patient reports she has recurrent impacted ear wax in her right ear, and feels the same today. Denies ear pain, trauma.  Patient also reports she was lifting a case of water with her sister, and after which she felt low back pain in her left lumbar region. Sx as above, relieved with OTC pain medication.     Allergies  Allergen Reactions  . Sulfa Antibiotics Itching     Current Outpatient Prescriptions:  .  ALPRAZolam (XANAX) 0.5 MG tablet, Take 1 tablet (0.5 mg total) by mouth at bedtime. (Patient taking differently: Take 0.5 mg by mouth as needed. ), Disp: 90 tablet, Rfl: 1 .  Calcium-Magnesium-Vitamin D (CALCIUM 500 PO), Take by mouth. am, Disp: , Rfl:  .  cetirizine (ZYRTEC) 10 MG tablet, Take by mouth as needed. , Disp: , Rfl:  .  furosemide (LASIX) 20 MG tablet, Take 1 tablet (20 mg  total) by mouth daily. (Patient taking differently: Take 20 mg by mouth daily as needed. ), Disp: 90 tablet, Rfl: 3 .  hydrochlorothiazide (HYDRODIURIL) 25 MG tablet, Take 1 tablet (25 mg total) by mouth daily. (Patient taking differently: Take 25 mg by mouth daily. am), Disp: 90 tablet, Rfl: 3 .  levothyroxine (SYNTHROID, LEVOTHROID) 75 MCG tablet, Take 1 tablet (75 mcg total) by mouth daily before breakfast., Disp: 90 tablet, Rfl: 3 .  Multiple Vitamins-Minerals (MULTIVITAMIN ADULT PO), Take by mouth. am, Disp: , Rfl:  .  pantoprazole (PROTONIX) 40 MG tablet, Take 1 tablet (40 mg total) by mouth 2 (two) times daily. (Patient taking differently: Take 40 mg by mouth 2 (two) times daily. Am and before dinner), Disp: 180 tablet, Rfl: 3 .  simvastatin (ZOCOR) 20 MG tablet, Take 1 tablet (20 mg total) by mouth at bedtime., Disp: 90 tablet, Rfl: 3  Review of Systems  Constitutional: Negative.   HENT: Positive for postnasal drip. Negative for congestion, ear discharge, ear pain, sinus pressure, sore throat, tinnitus and trouble swallowing.   Respiratory: Positive for cough. Negative for apnea, choking, chest tightness, shortness of breath, wheezing and stridor.   Gastrointestinal: Negative for abdominal pain and bowel incontinence.  Genitourinary: Negative for bladder incontinence.  Musculoskeletal: Positive for back pain. Negative for arthralgias, gait problem, joint swelling, myalgias, neck pain and neck stiffness.  Neurological: Negative  for dizziness, tingling, weakness, light-headedness, numbness, headaches and paresthesias.    Social History  Substance Use Topics  . Smoking status: Never Smoker  . Smokeless tobacco: Never Used  . Alcohol use No   Objective:   BP (!) 142/70 (BP Location: Left Arm, Patient Position: Sitting, Cuff Size: Normal)   Pulse 68   Temp 97.9 F (36.6 C) (Oral)   Resp 16   Wt 172 lb (78 kg)   LMP  (LMP Unknown)   BMI 32.50 kg/m   Physical Exam  Constitutional:  She appears well-developed and well-nourished. No distress.  HENT:  Right Ear: Ear canal normal.  Left Ear: Tympanic membrane and ear canal normal.  Ears:  Neck: Neck supple.  Musculoskeletal:       Cervical back: Normal.       Thoracic back: Normal.       Lumbar back: Normal.  Lymphadenopathy:    She has no cervical adenopathy.  Neurological: She has normal strength. No sensory deficit. Gait normal.  Skin: She is not diaphoretic.        Assessment & Plan:      Problem List Items Addressed This Visit    None    Visit Diagnoses    Right ear impacted cerumen    -  Primary   Acute left-sided low back pain without sciatica       Relevant Orders   DG Lumbar Spine Complete     Patient received right ear lavage in office today. Ear cleared of wax after this. Patient instructed to use Debrox in ears to soften wax. Avoid q-tips.  Patient is 82 y/o female presenting with acute low back pain. No visible deformity on exam, full ROM, no sensory deficits or abnormal gait all reassuring. Likely muscular in origin, but 2/2 age and mechanism of injury, will get lumbar spine XRAY to assess for fracture. Patient may continue OTC pain medications. Counseled on return precautions.  The entirety of the information documented in the History of Present Illness, Review of Systems and Physical Exam were personally obtained by me. Portions of this information were initially documented by Ashley Royalty, CMA and reviewed by me for thoroughness and accuracy.   Patient Instructions  May use DEBROX (hydrogen peroxide urea) to soften wax in ear and prevent impaction   Cerumen Impaction The structures of the external ear canal secrete a waxy substance known as cerumen. Excess cerumen can build up in the ear canal, causing a condition known as cerumen impaction. Cerumen impaction can cause ear pain and disrupt the function of the ear. The rate of cerumen production differs for each individual. In certain  individuals, the configuration of the ear canal may decrease his or her ability to naturally remove cerumen. CAUSES Cerumen impaction is caused by excessive cerumen production or buildup. RISK FACTORS  Frequent use of swabs to clean ears.  Having narrow ear canals.  Having eczema.  Being dehydrated. SIGNS AND SYMPTOMS  Diminished hearing.  Ear drainage.  Ear pain.  Ear itch. TREATMENT Treatment may involve:  Over-the-counter or prescription ear drops to soften the cerumen.  Removal of cerumen by a health care provider. This may be done with:  Irrigation with warm water. This is the most common method of removal.  Ear curettes and other instruments.  Surgery. This may be done in severe cases. HOME CARE INSTRUCTIONS  Take medicines only as directed by your health care provider.  Do not insert objects into the ear with the intent of  cleaning the ear. PREVENTION  Do not insert objects into the ear, even with the intent of cleaning the ear. Removing cerumen as a part of normal hygiene is not necessary, and the use of swabs in the ear canal is not recommended.  Drink enough water to keep your urine clear or pale yellow.  Control your eczema if you have it. SEEK MEDICAL CARE IF:  You develop ear pain.  You develop bleeding from the ear.  The cerumen does not clear after you use ear drops as directed.   This information is not intended to replace advice given to you by your health care provider. Make sure you discuss any questions you have with your health care provider.   Document Released: 08/16/2004 Document Revised: 07/30/2014 Document Reviewed: 02/23/2015 Elsevier Interactive Patient Education 2016 [place], PA-C  [facility]

## 2016-05-14 NOTE — Patient Instructions (Signed)
May use DEBROX (hydrogen peroxide urea) to soften wax in ear and prevent impaction   Cerumen Impaction The structures of the external ear canal secrete a waxy substance known as cerumen. Excess cerumen can build up in the ear canal, causing a condition known as cerumen impaction. Cerumen impaction can cause ear pain and disrupt the function of the ear. The rate of cerumen production differs for each individual. In certain individuals, the configuration of the ear canal may decrease his or her ability to naturally remove cerumen. CAUSES Cerumen impaction is caused by excessive cerumen production or buildup. RISK FACTORS  Frequent use of swabs to clean ears.  Having narrow ear canals.  Having eczema.  Being dehydrated. SIGNS AND SYMPTOMS  Diminished hearing.  Ear drainage.  Ear pain.  Ear itch. TREATMENT Treatment may involve:  Over-the-counter or prescription ear drops to soften the cerumen.  Removal of cerumen by a health care provider. This may be done with:  Irrigation with warm water. This is the most common method of removal.  Ear curettes and other instruments.  Surgery. This may be done in severe cases. HOME CARE INSTRUCTIONS  Take medicines only as directed by your health care provider.  Do not insert objects into the ear with the intent of cleaning the ear. PREVENTION  Do not insert objects into the ear, even with the intent of cleaning the ear. Removing cerumen as a part of normal hygiene is not necessary, and the use of swabs in the ear canal is not recommended.  Drink enough water to keep your urine clear or pale yellow.  Control your eczema if you have it. SEEK MEDICAL CARE IF:  You develop ear pain.  You develop bleeding from the ear.  The cerumen does not clear after you use ear drops as directed.   This information is not intended to replace advice given to you by your health care provider. Make sure you discuss any questions you have with your  health care provider.   Document Released: 08/16/2004 Document Revised: 07/30/2014 Document Reviewed: 02/23/2015 Elsevier Interactive Patient Education [employer].

## 2016-05-14 NOTE — Telephone Encounter (Signed)
-----   Message from Trinna Post, [place] sent at 05/14/2016 10:32 AM EDT ----- Lumbar xray showed some degenerative changes but no acute fracture. Please advise patient to continue OTC medications as needed for pain. Thank you.

## 2016-05-14 NOTE — Telephone Encounter (Signed)
Pt advised.   Thanks,   -Berdie Malter

## 2016-06-07 DIAGNOSIS — M3501 Sicca syndrome with keratoconjunctivitis: Secondary | ICD-10-CM | POA: Diagnosis not present

## 2016-06-27 ENCOUNTER — Other Ambulatory Visit: Payer: Self-pay | Admitting: Family Medicine

## 2016-08-28 ENCOUNTER — Ambulatory Visit (INDEPENDENT_AMBULATORY_CARE_PROVIDER_SITE_OTHER): Payer: Medicare HMO

## 2016-08-28 ENCOUNTER — Ambulatory Visit: Payer: Medicare HMO | Admitting: Family Medicine

## 2016-08-28 VITALS — BP 134/67 | HR 76 | Temp 98.1°F | Ht 62.0 in | Wt 169.8 lb

## 2016-08-28 VITALS — BP 134/78 | HR 76 | Temp 98.1°F | Ht 62.0 in | Wt 169.0 lb

## 2016-08-28 DIAGNOSIS — Z1211 Encounter for screening for malignant neoplasm of colon: Secondary | ICD-10-CM

## 2016-08-28 DIAGNOSIS — Z2820 Immunization not carried out because of patient decision for unspecified reason: Secondary | ICD-10-CM | POA: Diagnosis not present

## 2016-08-28 DIAGNOSIS — R10824 Left lower quadrant rebound abdominal tenderness: Secondary | ICD-10-CM

## 2016-08-28 DIAGNOSIS — Z1231 Encounter for screening mammogram for malignant neoplasm of breast: Secondary | ICD-10-CM | POA: Diagnosis not present

## 2016-08-28 DIAGNOSIS — Z0000 Encounter for general adult medical examination without abnormal findings: Secondary | ICD-10-CM | POA: Diagnosis not present

## 2016-08-28 DIAGNOSIS — Z1239 Encounter for other screening for malignant neoplasm of breast: Secondary | ICD-10-CM

## 2016-08-28 LAB — IFOBT (OCCULT BLOOD): IFOBT: NEGATIVE

## 2016-08-28 NOTE — Progress Notes (Signed)
Patient: Lynn Williams, Female    DOB: [birth year], 83 y.o.   MRN: [id number] Visit Date: 08/28/2016  Today's Provider: [name], MD   Chief Complaint  Patient presents with  . Annual Exam   Subjective:  Lynn Williams is a 83 y.o. female who presents today for health maintenance and complete physical. She feels well. She reports exercising 4 times a week, for about 1 hour doing stretching, strength training/weights. She reports she is sleeping well. Immunization History  Administered Date(s) Administered  . Influenza, High Dose Seasonal PF 05/04/2015, 05/14/2016  . Pneumococcal Conjugate-13 12/23/2013  . Pneumococcal Polysaccharide-23 07/24/1996, 08/04/2003  6CIT score today was 2.  Last Colonoscopy 12/17/06 diverticulosis, hemorrhoids.  Endoscopy 01/16/16 gastritis and hiatal hernia otherwise normal  Mammogram 09/06/15 normal  Pap smear 12/11/10 normal  BMD 12/23/12 osteopenia and osteoporosis of left forearm.  Review of Systems  Constitutional: Negative.   HENT: Negative.   Eyes: Negative.   Respiratory: Negative.   Cardiovascular: Negative.   Gastrointestinal: Negative.   Endocrine: Negative.   Genitourinary: Negative.   Musculoskeletal: Negative.   Skin: Negative.   Allergic/Immunologic: Negative.   Neurological: Negative.   Hematological: Negative.   Psychiatric/Behavioral: Negative.     Social History   Social History  . Marital status: Married    Spouse name: N/A  . Number of children: N/A  . Years of education: N/A   Occupational History  . Not on file.   Social History Main Topics  . Smoking status: Never Smoker  . Smokeless tobacco: Never Used  . Alcohol use No  . Drug use: No  . Sexual activity: Not on file   Other Topics Concern  . Not on file   Social History Narrative  . No narrative on file    Patient Active Problem List   Diagnosis Date Noted  . Abdominal pain, epigastric   . Gastritis   . Hiatal hernia   . Cervical  stenosis (uterine cervix) 01/12/2016  . Abnormal ultrasound of endometrium 01/12/2016  . Abnormal collection of fluid in uterine cavity 01/12/2016  . Pelvic pain in female 01/12/2016  . Medicare annual wellness visit, subsequent 01/25/2015  . Allergic rhinitis 11/26/2014  . Basal cell carcinoma of nose 11/26/2014  . DD (diverticular disease) 11/26/2014  . Bloodgood disease 11/26/2014  . Essential (primary) hypertension 11/26/2014  . Hypercholesteremia 11/26/2014  . Adult hypothyroidism 11/26/2014  . Cannot sleep 11/26/2014  . Arthritis, degenerative 11/26/2014    Past Surgical History:  Procedure Laterality Date  . BREAST BIOPSY Right 1978   benign  . BREAST CYST ASPIRATION    . BREAST LUMPECTOMY Right   . CATARACT EXTRACTION Bilateral   . ESOPHAGOGASTRODUODENOSCOPY (EGD) WITH PROPOFOL N/A 01/16/2016   Procedure: ESOPHAGOGASTRODUODENOSCOPY (EGD) WITH PROPOFOL;  Surgeon: Lucilla Lame, MD;  Location: [place];  Service: Endoscopy;  Laterality: N/A;  . TUBAL LIGATION      Her family history includes Alzheimer's disease in her sister; COPD in her brother and brother; Coronary artery disease in her sister and sister; Diabetes in her sister; Heart attack in her brother, brother, mother, sister, and sister; Heart disease in her brother, brother, and sister; Hypertension in her mother and sister; Prostate cancer in her father; Stroke in her mother and sister.     Outpatient Encounter Prescriptions as of 08/28/2016  Medication Sig Note  . ALPRAZolam (XANAX) 0.5 MG tablet Take 1 tablet (0.5 mg total) by mouth at bedtime. (Patient taking differently: Take 0.5 mg by mouth  as needed. )   . Calcium-Magnesium-Vitamin D (CALCIUM 500 PO) Take by mouth. am 11/26/2014: Received from: [facility]  . cetirizine (ZYRTEC) 10 MG tablet Take by mouth as needed.  11/26/2014: Received from: [facility]  . furosemide (LASIX) 20 MG tablet Take 1 tablet (20 mg total) by  mouth daily. (Patient taking differently: Take 20 mg by mouth daily. ) 01/16/2016: PRN, last taken 2-3 months ago   . hydrochlorothiazide (HYDRODIURIL) 25 MG tablet TAKE 1 TABLET EVERY DAY   . levothyroxine (SYNTHROID, LEVOTHROID) 75 MCG tablet Take 1 tablet (75 mcg total) by mouth daily before breakfast.   . Multiple Vitamins-Minerals (MULTIVITAMIN ADULT PO) Take by mouth. am 11/26/2014: Received from: [facility]  . pantoprazole (PROTONIX) 40 MG tablet Take 1 tablet (40 mg total) by mouth 2 (two) times daily. (Patient taking differently: Take 40 mg by mouth 2 (two) times daily. Am and before dinner)   . simvastatin (ZOCOR) 20 MG tablet TAKE 1 TABLET AT BEDTIME    No facility-administered encounter medications on file as of 08/28/2016.     Patient Care Team: Jerrol Banana., MD as PCP - General (Family Medicine) Birder Robson, MD as Referring Physician (Ophthalmology)      Objective:   Vitals:  Vitals:   08/28/16 0905  BP: 134/78  Pulse: 76  Temp: 98.1 F (36.7 C)  Weight: 169 lb (76.7 kg)  Height: 5\' 2"  (1.575 m)    Physical Exam  Constitutional: She is oriented to person, place, and time. She appears well-developed and well-nourished.  HENT:  Head: Normocephalic and atraumatic.  Right Ear: External ear normal.  Left Ear: External ear normal.  Eyes: Conjunctivae are normal. Pupils are equal, round, and reactive to light.  Neck: Normal range of motion. Neck supple.  Cardiovascular: Normal rate, regular rhythm, normal heart sounds and intact distal pulses.   No murmur heard. Pulmonary/Chest: Effort normal and breath sounds normal. No respiratory distress. She has no wheezes. Right breast exhibits no skin change and no tenderness. Left breast exhibits no skin change and no tenderness. Breasts are symmetrical.  Abdominal: Soft. There is tenderness (mild LLQ).  Genitourinary: Rectal exam shows guaiac negative stool. No vaginal discharge found.   Genitourinary Comments: Bimanual exam is normal  Musculoskeletal: She exhibits no edema or tenderness.  Neurological: She is alert and oriented to person, place, and time.  Skin: Skin is warm. No rash noted. No erythema.  Psychiatric: She has a normal mood and affect. Her behavior is normal. Judgment and thought content normal.     Depression Screen PHQ 2/9 Scores 08/28/2016 08/25/2015 01/25/2015 01/25/2015  PHQ - 2 Score 0 0 0 0   Assessment & Plan:     Routine Health Maintenance and Physical Exam  Exercise Activities and Dietary recommendations Goals    . Increase water intake          Starting 08/28/16, I will increase my water intake to 5 glasses a day.      1. Annual physical exam Get labs checked on the next visit.  2. Immunization not carried out because of patient decision Declined Zoster. Will need Td immunization on the next visit or when she gets a cut to get this updated.  3. Colon cancer screening - IFOBT POC (occult bld, rslt in office) - Cologuard  4. Left lower quadrant abdominal tenderness with rebound tenderness Due to this issue found on the exam order Cologuard. OC lite is normal today. Bimanual exam was  normal today.  5. Breast cancer screening - MM Digital Screening; Future 6.Diverticulosis 7.HTN 8.HLD 9.Hypothyroid 10.Allergic Rhinitis Check labs soon. HPI, Exam and A&P transcribed under direction and in the presence of Miguel Aschoff, MD.  Discussed health benefits of physical activity, and encouraged her to engage in regular exercise appropriate for her age and condition.   I have done the exam and reviewed the chart and it is accurate to the best of my knowledge. Development worker, community has been used and  any errors in dictation or transcription are unintentional. Miguel Aschoff M.D. [facility]

## 2016-08-28 NOTE — Progress Notes (Signed)
Subjective:   Lynn Williams is a 83 y.o. female who presents for Medicare Annual (Subsequent) preventive examination.  Review of Systems:  N/A  Cardiac Risk Factors include: advanced age (>53men, >45 women);dyslipidemia;hypertension;obesity (BMI >30kg/m2)     Objective:     Vitals: BP 134/67 (BP Location: Right Arm)   Pulse 76   Temp 98.1 F (36.7 C) (Oral)   Ht 5\' 2"  (1.575 m)   Wt 169 lb 12.8 oz (77 kg)   LMP  (LMP Unknown)   BMI 31.06 kg/m   Body mass index is 31.06 kg/m.   Tobacco History  Smoking Status  . Never Smoker  Smokeless Tobacco  . Never Used     Counseling given: Not Answered   Past Medical History:  Diagnosis Date  . Arthritis    hands and wrist and knees  . Cough    chronic/allergies  . GERD (gastroesophageal reflux disease)   . Hyperlipidemia   . Hypertension    controlled on meds  . Hypothyroidism   . Wears partial dentures    upper   Past Surgical History:  Procedure Laterality Date  . BREAST BIOPSY Right 1978   benign  . BREAST CYST ASPIRATION    . BREAST LUMPECTOMY Right   . CATARACT EXTRACTION Bilateral   . ESOPHAGOGASTRODUODENOSCOPY (EGD) WITH PROPOFOL N/A 01/16/2016   Procedure: ESOPHAGOGASTRODUODENOSCOPY (EGD) WITH PROPOFOL;  Surgeon: Lucilla Lame, MD;  Location: [place];  Service: Endoscopy;  Laterality: N/A;  . TUBAL LIGATION     Family History  Problem Relation Age of Onset  . Hypertension Mother   . Heart attack Mother   . Stroke Mother   . Prostate cancer Father   . Heart attack Brother   . Stroke Sister   . Heart attack Sister   . Coronary artery disease Sister   . Alzheimer's disease Sister   . Heart attack Brother   . COPD Brother   . Heart attack Sister   . Coronary artery disease Sister   . Diabetes Sister   . Hypertension Sister   . Heart disease Sister   . Heart disease Brother   . Heart disease Brother   . COPD Brother   . Breast cancer Neg Hx    History  Sexual Activity  .  Sexual activity: Not on file    Outpatient Encounter Prescriptions as of 08/28/2016  Medication Sig  . ALPRAZolam (XANAX) 0.5 MG tablet Take 1 tablet (0.5 mg total) by mouth at bedtime. (Patient taking differently: Take 0.5 mg by mouth as needed. )  . Calcium-Magnesium-Vitamin D (CALCIUM 500 PO) Take by mouth. am  . cetirizine (ZYRTEC) 10 MG tablet Take by mouth as needed.   . furosemide (LASIX) 20 MG tablet Take 1 tablet (20 mg total) by mouth daily. (Patient taking differently: Take 20 mg by mouth daily. )  . hydrochlorothiazide (HYDRODIURIL) 25 MG tablet TAKE 1 TABLET EVERY DAY  . levothyroxine (SYNTHROID, LEVOTHROID) 75 MCG tablet Take 1 tablet (75 mcg total) by mouth daily before breakfast.  . Multiple Vitamins-Minerals (MULTIVITAMIN ADULT PO) Take by mouth. am  . pantoprazole (PROTONIX) 40 MG tablet Take 1 tablet (40 mg total) by mouth 2 (two) times daily. (Patient taking differently: Take 40 mg by mouth 2 (two) times daily. Am and before dinner)  . simvastatin (ZOCOR) 20 MG tablet TAKE 1 TABLET AT BEDTIME   No facility-administered encounter medications on file as of 08/28/2016.     Activities of Daily Living In your  present state of health, do you have any difficulty performing the following activities: 08/28/2016 01/16/2016  Hearing? N N  Vision? N N  Difficulty concentrating or making decisions? N N  Walking or climbing stairs? N N  Dressing or bathing? N N  Doing errands, shopping? N -  Preparing Food and eating ? N -  Using the Toilet? N -  In the past six months, have you accidently leaked urine? N -  Do you have problems with loss of bowel control? Y -  Managing your Medications? N -  Managing your Finances? N -  Housekeeping or managing your Housekeeping? N -  Some recent data might be hidden    Patient Care Team: Jerrol Banana., MD as PCP - General (Family Medicine) Birder Robson, MD as Referring Physician (Ophthalmology)    Assessment:     Exercise  Activities and Dietary recommendations Current Exercise Habits: Structured exercise class, Type of exercise: stretching;strength training/weights, Time (Minutes): 60, Frequency (Times/Week): 4, Weekly Exercise (Minutes/Week): 240, Intensity: Mild, Exercise limited by: None identified  Goals    . Increase water intake          Starting 08/28/16, I will increase my water intake to 5 glasses a day.      Fall Risk Fall Risk  08/28/2016 08/25/2015 01/25/2015  Falls in the past year? No No No   Depression Screen PHQ 2/9 Scores 08/28/2016 08/25/2015 01/25/2015 01/25/2015  PHQ - 2 Score 0 0 0 0     Cognitive Function     6CIT Screen 08/28/2016  What Year? 0 points  What month? 0 points  What time? 0 points  Count back from 20 0 points  Months in reverse 2 points  Repeat phrase 0 points  Total Score 2    Immunization History  Administered Date(s) Administered  . Influenza, High Dose Seasonal PF 05/04/2015, 05/14/2016  . Pneumococcal Conjugate-13 12/23/2013  . Pneumococcal Polysaccharide-23 07/24/1996, 08/04/2003   Screening Tests Health Maintenance  Topic Date Due  . ZOSTAVAX  07/23/2026 (Originally 05/07/1994)  . TETANUS/TDAP  12/24/2023  . INFLUENZA VACCINE  Completed  . DEXA SCAN  Completed  . PNA vac Low Risk Adult  Completed      Plan:  I have personally reviewed and addressed the Medicare Annual Wellness questionnaire and have noted the following in the patient's chart:  A. Medical and social history B. Use of alcohol, tobacco or illicit drugs  C. Current medications and supplements D. Functional ability and status E.  Nutritional status F.  Physical activity G. Advance directives H. List of other physicians I.  Hospitalizations, surgeries, and ER visits in previous 12 months J.  [place] such as hearing and vision if needed, cognitive and depression L. Referrals and appointments - none  In addition, I have reviewed and discussed with patient certain preventive  protocols, quality metrics, and best practice recommendations. A written personalized care plan for preventive services as well as general preventive health recommendations were provided to patient.  See attached scanned questionnaire for additional information.   Signed,  Fabio Neighbors, LPN Nurse Health Advisor   MD Recommendations: None. Pt declined zostavax today. I have reviewed the health advisors note, was  available for consultation and I agree with documentation and plan. Miguel Aschoff MD [facility]

## 2016-08-28 NOTE — Patient Instructions (Signed)
Health Maintenance, Female Introduction Adopting a healthy lifestyle and getting preventive care can go a long way to promote health and wellness. Talk with your health care provider about what schedule of regular examinations is right for you. This is a good chance for you to check in with your provider about disease prevention and staying healthy. In between checkups, there are plenty of things you can do on your own. Experts have done a lot of research about which lifestyle changes and preventive measures are most likely to keep you healthy. Ask your health care provider for more information. Weight and diet Eat a healthy diet  Be sure to include plenty of vegetables, fruits, low-fat dairy products, and lean protein.  Do not eat a lot of foods high in solid fats, added sugars, or salt.  Get regular exercise. This is one of the most important things you can do for your health.  Most adults should exercise for at least 150 minutes each week. The exercise should increase your heart rate and make you sweat (moderate-intensity exercise).  Most adults should also do strengthening exercises at least twice a week. This is in addition to the moderate-intensity exercise. Maintain a healthy weight  Body mass index (BMI) is a measurement that can be used to identify possible weight problems. It estimates body fat based on height and weight. Your health care provider can help determine your BMI and help you achieve or maintain a healthy weight.  For females 4 years of age and older:  A BMI below 18.5 is considered underweight.  A BMI of 18.5 to 24.9 is normal.  A BMI of 25 to 29.9 is considered overweight.  A BMI of 30 and above is considered obese. Watch levels of cholesterol and blood lipids  You should start having your blood tested for lipids and cholesterol at 81 years of age, then have this test every 5 years.  You may need to have your cholesterol levels checked more often  if:  Your lipid or cholesterol levels are high.  You are older than 81 years of age.  You are at high risk for heart disease. Cancer screening Lung Cancer  Lung cancer screening is recommended for adults 12-31 years old who are at high risk for lung cancer because of a history of smoking.  A yearly low-dose CT scan of the lungs is recommended for people who:  Currently smoke.  Have quit within the past 15 years.  Have at least a 30-pack-year history of smoking. A pack year is smoking an average of one pack of cigarettes a day for 1 year.  Yearly screening should continue until it has been 15 years since you quit.  Yearly screening should stop if you develop a health problem that would prevent you from having lung cancer treatment. Breast Cancer  Practice breast self-awareness. This means understanding how your breasts normally appear and feel.  It also means doing regular breast self-exams. Let your health care provider know about any changes, no matter how small.  If you are in your 20s or 30s, you should have a clinical breast exam (CBE) by a health care provider every 1-3 years as part of a regular health exam.  If you are 74 or older, have a CBE every year. Also consider having a breast X-ray (mammogram) every year.  If you have a family history of breast cancer, talk to your health care provider about genetic screening.  If you are at high risk for breast cancer,  talk to your health care provider about having an MRI and a mammogram every year.  Breast cancer gene (BRCA) assessment is recommended for women who have family members with BRCA-related cancers. BRCA-related cancers include:  Breast.  Ovarian.  Tubal.  Peritoneal cancers.  Results of the assessment will determine the need for genetic counseling and BRCA1 and BRCA2 testing. Colorectal Cancer  This type of cancer can be detected and often prevented.  Routine colorectal cancer screening usually begins  at 81 years of age and continues through 81 years of age.  Your health care provider may recommend screening at an earlier age if you have risk factors for colon cancer.  Your health care provider may also recommend using home test kits to check for hidden blood in the stool.  A small camera at the end of a tube can be used to examine your colon directly (sigmoidoscopy or colonoscopy). This is done to check for the earliest forms of colorectal cancer.  Routine screening usually begins at age 50.  Direct examination of the colon should be repeated every 5-10 years through 81 years of age. However, you may need to be screened more often if early forms of precancerous polyps or small growths are found. Skin Cancer  Check your skin from head to toe regularly.  Tell your health care provider about any new moles or changes in moles, especially if there is a change in a mole's shape or color.  Also tell your health care provider if you have a mole that is larger than the size of a pencil eraser.  Always use sunscreen. Apply sunscreen liberally and repeatedly throughout the day.  Protect yourself by wearing long sleeves, pants, a wide-brimmed hat, and sunglasses whenever you are outside. Heart disease, diabetes, and high blood pressure  High blood pressure causes heart disease and increases the risk of stroke. High blood pressure is more likely to develop in:  People who have blood pressure in the high end of the normal range (130-139/85-89 mm Hg).  People who are overweight or obese.  People who are African American.  If you are 18-39 years of age, have your blood pressure checked every 3-5 years. If you are 40 years of age or older, have your blood pressure checked every year. You should have your blood pressure measured twice-once when you are at a hospital or clinic, and once when you are not at a hospital or clinic. Record the average of the two measurements. To check your blood pressure  when you are not at a hospital or clinic, you can use:  An automated blood pressure machine at a pharmacy.  A home blood pressure monitor.  If you are between 55 years and 79 years old, ask your health care provider if you should take aspirin to prevent strokes.  Have regular diabetes screenings. This involves taking a blood sample to check your fasting blood sugar level.  If you are at a normal weight and have a low risk for diabetes, have this test once every three years after 81 years of age.  If you are overweight and have a high risk for diabetes, consider being tested at a younger age or more often. Preventing infection Hepatitis B  If you have a higher risk for hepatitis B, you should be screened for this virus. You are considered at high risk for hepatitis B if:  You were born in a country where hepatitis B is common. Ask your health care provider which countries are   considered high risk.  Your parents were born in a high-risk country, and you have not been immunized against hepatitis B (hepatitis B vaccine).  You have HIV or AIDS.  You use needles to inject street drugs.  You live with someone who has hepatitis B.  You have had sex with someone who has hepatitis B.  You get hemodialysis treatment.  You take certain medicines for conditions, including cancer, organ transplantation, and autoimmune conditions. Hepatitis C  Blood testing is recommended for:  Everyone born from 1945 through 1965.  Anyone with known risk factors for hepatitis C. Osteoporosis and menopause  Osteoporosis is a disease in which the bones lose minerals and strength with aging. This can result in serious bone fractures. Your risk for osteoporosis can be identified using a bone density scan.  If you are 65 years of age or older, or if you are at risk for osteoporosis and fractures, ask your health care provider if you should be screened.  Ask your health care provider whether you should take  a calcium or vitamin D supplement to lower your risk for osteoporosis.  Menopause may have certain physical symptoms and risks.  Hormone replacement therapy may reduce some of these symptoms and risks. Talk to your health care provider about whether hormone replacement therapy is right for you. Follow these instructions at home:  Schedule regular health, dental, and eye exams.  Stay current with your immunizations.  Do not use any tobacco products including cigarettes, chewing tobacco, or electronic cigarettes.  If you are pregnant, do not drink alcohol.  If you are breastfeeding, limit how much and how often you drink alcohol.  Limit alcohol intake to no more than 1 drink per day for nonpregnant women. One drink equals 12 ounces of beer, 5 ounces of wine, or 1 ounces of hard liquor.  Do not use street drugs.  Do not share needles.  Ask your health care provider for help if you need support or information about quitting drugs.  Tell your health care provider if you often feel depressed.  Tell your health care provider if you have ever been abused or do not feel safe at home. This information is not intended to replace advice given to you by your health care provider. Make sure you discuss any questions you have with your health care provider. Document Released: 01/22/2011 Document Revised: 12/15/2015 Document Reviewed: 04/12/2015  2017 Elsevier  

## 2016-09-04 ENCOUNTER — Telehealth: Payer: Self-pay | Admitting: Family Medicine

## 2016-09-04 NOTE — Telephone Encounter (Signed)
Order for cologuard faxed to [facility] °

## 2016-09-11 DIAGNOSIS — Z1211 Encounter for screening for malignant neoplasm of colon: Secondary | ICD-10-CM | POA: Diagnosis not present

## 2016-09-11 DIAGNOSIS — Z1212 Encounter for screening for malignant neoplasm of rectum: Secondary | ICD-10-CM | POA: Diagnosis not present

## 2016-09-13 ENCOUNTER — Other Ambulatory Visit: Payer: Self-pay | Admitting: Family Medicine

## 2016-09-13 DIAGNOSIS — R1013 Epigastric pain: Secondary | ICD-10-CM

## 2016-09-19 LAB — COLOGUARD: COLOGUARD: NEGATIVE

## 2016-09-26 ENCOUNTER — Other Ambulatory Visit: Payer: Self-pay | Admitting: Family Medicine

## 2016-09-26 ENCOUNTER — Ambulatory Visit
Admission: RE | Admit: 2016-09-26 | Discharge: 2016-09-26 | Disposition: A | Discharge status: Home / Self Care | Payer: Commercial Managed Care - HMO | Source: Ambulatory Visit | Attending: Family Medicine | Admitting: Family Medicine

## 2016-09-26 DIAGNOSIS — Z1231 Encounter for screening mammogram for malignant neoplasm of breast: Secondary | ICD-10-CM | POA: Diagnosis not present

## 2016-09-26 DIAGNOSIS — Z1239 Encounter for other screening for malignant neoplasm of breast: Secondary | ICD-10-CM

## 2016-09-27 DIAGNOSIS — K006 Disturbances in tooth eruption: Secondary | ICD-10-CM | POA: Diagnosis not present

## 2016-10-24 LAB — COLOGUARD: Cologuard: NEGATIVE

## 2016-10-29 NOTE — Progress Notes (Signed)
Advised  ED 

## 2016-12-03 ENCOUNTER — Ambulatory Visit (INDEPENDENT_AMBULATORY_CARE_PROVIDER_SITE_OTHER): Payer: Medicare HMO | Admitting: Family Medicine

## 2016-12-03 ENCOUNTER — Encounter: Payer: Self-pay | Admitting: Family Medicine

## 2016-12-03 VITALS — BP 144/72 | HR 72 | Temp 97.8°F | Resp 16 | Wt 171.0 lb

## 2016-12-03 DIAGNOSIS — I10 Essential (primary) hypertension: Secondary | ICD-10-CM | POA: Diagnosis not present

## 2016-12-03 DIAGNOSIS — E7800 Pure hypercholesterolemia, unspecified: Secondary | ICD-10-CM

## 2016-12-03 DIAGNOSIS — E039 Hypothyroidism, unspecified: Secondary | ICD-10-CM

## 2016-12-03 NOTE — Progress Notes (Signed)
Subjective:  HPI   Pt is here for her yearly labs. She is fasting.    Hypertension, follow-up:  BP Readings from Last 3 Encounters:  12/03/16 (!) 144/72  08/28/16 134/78  08/28/16 134/67    She was last seen for hypertension 3 months ago.  BP at that visit was 134/78. Management since that visit includes none. She reports good compliance with treatment. She is not having side effects.  She is exercising about 4 days a week, classes. She is adherent to low salt diet.   Outside blood pressures are 130's/70's. She is experiencing none.  Patient denies chest pain, chest pressure/discomfort, claudication, dyspnea, exertional chest pressure/discomfort, fatigue, irregular heart beat, lower extremity edema, near-syncope, orthopnea, palpitations, paroxysmal nocturnal dyspnea, syncope and tachypnea.   Cardiovascular risk factors include advanced age (older than 79 for men, 40 for women) and hypertension.  Wt Readings from Last 3 Encounters:  12/03/16 171 lb (77.6 kg)  08/28/16 169 lb (76.7 kg)  08/28/16 169 lb 12.8 oz (77 kg)   ------------------------------------------------------------------------   Prior to Admission medications   Medication Sig Start Date End Date Taking? Authorizing Provider  ALPRAZolam Duanne Moron) 0.5 MG tablet Take 1 tablet (0.5 mg total) by mouth at bedtime. Patient taking differently: Take 0.5 mg by mouth as needed.  08/25/15   Jerrol Banana., MD  Calcium-Magnesium-Vitamin D (CALCIUM 500 PO) Take by mouth. am 05/30/11   [provider]  cetirizine (ZYRTEC) 10 MG tablet Take by mouth as needed.  06/07/14   [provider]  furosemide (LASIX) 20 MG tablet Take 1 tablet (20 mg total) by mouth daily. Patient taking differently: Take 20 mg by mouth daily.  08/25/15   Jerrol Banana., MD  hydrochlorothiazide (HYDRODIURIL) 25 MG tablet TAKE 1 TABLET EVERY DAY 06/28/16   Jerrol Banana., MD  levothyroxine (SYNTHROID, LEVOTHROID) 75  MCG tablet TAKE 1 TABLET (75 MCG TOTAL) BY MOUTH DAILY BEFORE BREAKFAST. 09/13/16   Jerrol Banana., MD  Multiple Vitamins-Minerals (MULTIVITAMIN ADULT PO) Take by mouth. am 05/30/11   [provider]  pantoprazole (PROTONIX) 40 MG tablet TAKE 1 TABLET (40 MG TOTAL) BY MOUTH 2 (TWO) TIMES DAILY. 09/13/16   Jerrol Banana., MD  simvastatin (ZOCOR) 20 MG tablet TAKE 1 TABLET AT BEDTIME 06/28/16   Jerrol Banana., MD    Patient Active Problem List   Diagnosis Date Noted  . Abdominal pain, epigastric   . Gastritis   . Hiatal hernia   . Cervical stenosis (uterine cervix) 01/12/2016  . Abnormal ultrasound of endometrium 01/12/2016  . Abnormal collection of fluid in uterine cavity 01/12/2016  . Pelvic pain in female 01/12/2016  . Medicare annual wellness visit, subsequent 01/25/2015  . Allergic rhinitis 11/26/2014  . Basal cell carcinoma of nose 11/26/2014  . DD (diverticular disease) 11/26/2014  . Bloodgood disease 11/26/2014  . Essential (primary) hypertension 11/26/2014  . Hypercholesteremia 11/26/2014  . Adult hypothyroidism 11/26/2014  . Cannot sleep 11/26/2014  . Arthritis, degenerative 11/26/2014    Past Medical History:  Diagnosis Date  . Arthritis    hands and wrist and knees  . Cough    chronic/allergies  . GERD (gastroesophageal reflux disease)   . Hyperlipidemia   . Hypertension    controlled on meds  . Hypothyroidism   . Wears partial dentures    upper    Social History   Social History  . Marital status: Married    Spouse name:  N/A  . Number of children: N/A  . Years of education: N/A   Occupational History  . Not on file.   Social History Main Topics  . Smoking status: Never Smoker  . Smokeless tobacco: Never Used  . Alcohol use No  . Drug use: No  . Sexual activity: Not on file   Other Topics Concern  . Not on file   Social History Narrative  . No narrative on file    Allergies  Allergen Reactions  . Sulfa  Antibiotics Itching    Review of Systems  Constitutional: Negative.   HENT: Negative.   Eyes: Negative.   Respiratory: Negative.   Cardiovascular: Negative.   Gastrointestinal: Negative.   Genitourinary: Negative.   Musculoskeletal: Negative.   Skin: Negative.   Neurological: Negative.   Endo/Heme/Allergies: Negative.   Psychiatric/Behavioral: Negative.     Immunization History  Administered Date(s) Administered  . Influenza, High Dose Seasonal PF 05/04/2015, 05/14/2016  . Pneumococcal Conjugate-13 12/23/2013  . Pneumococcal Polysaccharide-23 07/24/1996, 08/04/2003    Objective:  BP (!) 144/72 (BP Location: Left Arm, Patient Position: Sitting, Cuff Size: Normal)   Pulse 72   Temp 97.8 F (36.6 C) (Oral)   Resp 16   Wt 171 lb (77.6 kg)   LMP  (LMP Unknown)   BMI 31.28 kg/m   Physical Exam  Constitutional: She is oriented to person, place, and time and well-developed, well-nourished, and in no distress.  Eyes: Conjunctivae and EOM are normal. Pupils are equal, round, and reactive to light.  Neck: Normal range of motion. Neck supple.  Cardiovascular: Normal rate, regular rhythm, normal heart sounds and intact distal pulses.   Pulmonary/Chest: Effort normal and breath sounds normal.  Musculoskeletal: Normal range of motion.  Neurological: She is alert and oriented to person, place, and time. She has normal reflexes. Gait normal. GCS score is 15.  Skin: Skin is warm and dry.  Psychiatric: Mood, memory, affect and judgment normal.    Lab Results  Component Value Date   WBC 6.3 11/28/2015   HGB 13.1 03/15/2014   HCT 40.8 11/28/2015   PLT 258 11/28/2015   GLUCOSE 99 11/28/2015   CHOL 149 11/28/2015   TRIG 87 11/28/2015   HDL 58 11/28/2015   LDLCALC 74 11/28/2015   TSH 7.600 (H) 11/28/2015    CMP     Component Value Date/Time   NA 143 11/28/2015 0945   K 4.2 11/28/2015 0945   K 3.2 (L) 08/17/2014 0930   CL 99 11/28/2015 0945   CO2 29 11/28/2015 0945    GLUCOSE 99 11/28/2015 0945   BUN 13 11/28/2015 0945   CREATININE 1.01 (H) 11/28/2015 0945   CALCIUM 10.0 11/28/2015 0945   PROT 7.2 11/28/2015 0945   ALBUMIN 4.4 11/28/2015 0945   AST 21 11/28/2015 0945   ALT 11 11/28/2015 0945   ALKPHOS 64 11/28/2015 0945   BILITOT 0.6 11/28/2015 0945   GFRNONAA 52 (L) 11/28/2015 0945   GFRAA 60 11/28/2015 0945    Assessment and Plan :  1. Essential hypertension Stable.  - CBC with Differential/Platelet  2. Pure hypercholesterolemia  - Lipid Panel With LDL/HDL Ratio - Comprehensive metabolic panel  3. Adult hypothyroidism  - TSH  HPI, Exam, and A&P Transcribed under the direction and in the presence of Monique Gift L. Cranford Mon, MD  Electronically Signed: Katina Dung, [place] MD [facility] 12/03/2016 8:26 AM

## 2016-12-04 ENCOUNTER — Telehealth: Payer: Self-pay | Admitting: Family Medicine

## 2016-12-04 LAB — COMPREHENSIVE METABOLIC PANEL
ALT: 15 IU/L (ref 0–32)
AST: 23 IU/L (ref 0–40)
Albumin/Globulin Ratio: 1.6 (ref 1.2–2.2)
Albumin: 4.4 g/dL (ref 3.5–4.7)
Alkaline Phosphatase: 68 IU/L (ref 39–117)
BUN/Creatinine Ratio: 13 (ref 12–28)
BUN: 12 mg/dL (ref 8–27)
Bilirubin Total: 0.5 mg/dL (ref 0.0–1.2)
CALCIUM: 9.9 mg/dL (ref 8.7–10.3)
CO2: 30 mmol/L — ABNORMAL HIGH (ref 18–29)
CREATININE: 0.92 mg/dL (ref 0.57–1.00)
Chloride: 97 mmol/L (ref 96–106)
GFR calc Af Amer: 67 mL/min/{1.73_m2} (ref >59)
GFR, EST NON AFRICAN AMERICAN: 58 mL/min/{1.73_m2} — AB (ref >59)
GLUCOSE: 98 mg/dL (ref 65–99)
Globulin, Total: 2.7 g/dL (ref 1.5–4.5)
POTASSIUM: 4 mmol/L (ref 3.5–5.2)
SODIUM: 143 mmol/L (ref 134–144)
TOTAL PROTEIN: 7.1 g/dL (ref 6.0–8.5)

## 2016-12-04 LAB — CBC WITH DIFFERENTIAL/PLATELET
BASOS ABS: 0 10*3/uL (ref 0.0–0.2)
BASOS: 1 %
EOS (ABSOLUTE): 0.2 10*3/uL (ref 0.0–0.4)
Eos: 3 %
Hematocrit: 40.3 % (ref 34.0–46.6)
Hemoglobin: 13.1 g/dL (ref 11.1–15.9)
IMMATURE GRANULOCYTES: 0 %
Immature Grans (Abs): 0 10*3/uL (ref 0.0–0.1)
Lymphocytes Absolute: 2 10*3/uL (ref 0.7–3.1)
Lymphs: 36 %
MCH: 28.8 pg (ref 26.6–33.0)
MCHC: 32.5 g/dL (ref 31.5–35.7)
MCV: 89 fL (ref 79–97)
MONOS ABS: 0.6 10*3/uL (ref 0.1–0.9)
Monocytes: 11 %
NEUTROS ABS: 2.7 10*3/uL (ref 1.4–7.0)
NEUTROS PCT: 49 %
Platelets: 274 10*3/uL (ref 150–379)
RBC: 4.55 x10E6/uL (ref 3.77–5.28)
RDW: 14.4 % (ref 12.3–15.4)
WBC: 5.5 10*3/uL (ref 3.4–10.8)

## 2016-12-04 LAB — LIPID PANEL WITH LDL/HDL RATIO
Cholesterol, Total: 154 mg/dL (ref 100–199)
HDL: 58 mg/dL (ref >39)
LDL Calculated: 74 mg/dL (ref 0–99)
LDl/HDL Ratio: 1.3 ratio (ref 0.0–3.2)
Triglycerides: 109 mg/dL (ref 0–149)
VLDL Cholesterol Cal: 22 mg/dL (ref 5–40)

## 2016-12-04 LAB — TSH: TSH: 0.051 u[IU]/mL — AB (ref 0.450–4.500)

## 2016-12-04 MED ORDER — LEVOTHYROXINE SODIUM 50 MCG PO TABS: 75.0000 ug | ORAL_TABLET | Freq: Every day | ORAL | 3 refills | Status: DC

## 2016-12-04 NOTE — Telephone Encounter (Signed)
-----   Message from Jerrol Banana., MD sent at 12/04/2016 10:17 AM EDT ----- Labs okay. Thyroid overtreated. Decrease Synthroid) 75-50 g daily

## 2016-12-04 NOTE — Telephone Encounter (Signed)
Patient advised as below. Patient verbalizes understanding and is in agreement with treatment plan.  90 day supply sent to [facility] per pt.

## 2016-12-11 ENCOUNTER — Telehealth: Payer: Self-pay | Admitting: Family Medicine

## 2016-12-11 NOTE — Telephone Encounter (Signed)
Pt states she was understanding that her Rx levothyroxine (SYNTHROID, LEVOTHROID) 50 MCG tablet dosage was going to be decreased.  Pt states he directions states for her to take 1.5 tablets a day which would be back to the 75mg .  Please advise.  CB#[phone]/MW

## 2016-12-11 NOTE — Telephone Encounter (Signed)
Spoke with patient and advised her it should be 50 mg 1 tablet daily not 1.5, RX was sent in with wrong directions. Patient Lynn Williams

## 2017-04-22 ENCOUNTER — Other Ambulatory Visit: Payer: Self-pay | Admitting: Family Medicine

## 2017-05-06 ENCOUNTER — Other Ambulatory Visit: Payer: Self-pay | Admitting: Family Medicine

## 2017-05-08 ENCOUNTER — Ambulatory Visit (INDEPENDENT_AMBULATORY_CARE_PROVIDER_SITE_OTHER): Payer: Medicare HMO

## 2017-05-08 DIAGNOSIS — Z23 Encounter for immunization: Secondary | ICD-10-CM

## 2017-06-03 ENCOUNTER — Other Ambulatory Visit: Payer: Self-pay

## 2017-06-03 ENCOUNTER — Ambulatory Visit: Payer: Medicare HMO | Admitting: Family Medicine

## 2017-06-03 VITALS — BP 138/70 | HR 76 | Temp 97.5°F | Resp 14 | Wt 170.0 lb

## 2017-06-03 DIAGNOSIS — E039 Hypothyroidism, unspecified: Secondary | ICD-10-CM | POA: Diagnosis not present

## 2017-06-03 DIAGNOSIS — I10 Essential (primary) hypertension: Secondary | ICD-10-CM

## 2017-06-03 DIAGNOSIS — E7800 Pure hypercholesterolemia, unspecified: Secondary | ICD-10-CM

## 2017-06-03 LAB — TSH: TSH: 5.97 mIU/L — ABNORMAL HIGH (ref 0.40–4.50)

## 2017-06-03 NOTE — Progress Notes (Signed)
Lynn Williams  MRN: [id number] DOB: [birth year]  Subjective:  HPI   The patient is an 83 year old female who presents for follow up of chronic issues.  She was last seen on 12/03/16.    Hypertension- The patient is currently on HCTZ and reports no adverse effects.  On her last visit in May her blood pressure was 144/72.  She does  check her blood pressure outside of this office and has been getting readings that range 120's-130's over 70's.  Hypothyroidism- Patient had her TSH checked in May and her level was slightly low.  Her medication was adjusted to 50 mcg daily.  She needs follow up TSH on this today.  Hypercholesterolemia-In May the patient had normal lipids.  She does take Simvastatin and reports no problems with her medication.  The patient does have complaint of cough and some minimal wheezing.  She states she always gets sinus/allergies when the weather changes.  She states when she gets up in the morning she has coughing productive of clear sputum.  Throughout the day she continues to cough but with no production.  She states the cough is not keeping her awake at night but she feels like she is coughing all the time.  Patient Active Problem List   Diagnosis Date Noted  . Abdominal pain, epigastric   . Gastritis   . Hiatal hernia   . Cervical stenosis (uterine cervix) 01/12/2016  . Abnormal ultrasound of endometrium 01/12/2016  . Abnormal collection of fluid in uterine cavity 01/12/2016  . Pelvic pain in female 01/12/2016  . Medicare annual wellness visit, subsequent 01/25/2015  . Allergic rhinitis 11/26/2014  . Basal cell carcinoma of nose 11/26/2014  . DD (diverticular disease) 11/26/2014  . Bloodgood disease 11/26/2014  . Essential (primary) hypertension 11/26/2014  . Hypercholesteremia 11/26/2014  . Adult hypothyroidism 11/26/2014  . Cannot sleep 11/26/2014  . Arthritis, degenerative 11/26/2014    Past Medical History:  Diagnosis Date  . Arthritis    hands and wrist and knees  . Cough    chronic/allergies  . GERD (gastroesophageal reflux disease)   . Hyperlipidemia   . Hypertension    controlled on meds  . Hypothyroidism   . Wears partial dentures    upper    Social History   Socioeconomic History  . Marital status: Married    Spouse name: Not on file  . Number of children: Not on file  . Years of education: Not on file  . Highest education level: Not on file  Social Needs  . Financial resource strain: Not on file  . Food insecurity - worry: Not on file  . Food insecurity - inability: Not on file  . Transportation needs - medical: Not on file  . Transportation needs - non-medical: Not on file  Occupational History  . Not on file  Tobacco Use  . Smoking status: Never Smoker  . Smokeless tobacco: Never Used  Substance and Sexual Activity  . Alcohol use: No    Alcohol/week: 0.0 oz  . Drug use: No  . Sexual activity: Not on file  Other Topics Concern  . Not on file  Social History Narrative  . Not on file    Outpatient Encounter Medications as of 06/03/2017  Medication Sig Note  . ALPRAZolam (XANAX) 0.5 MG tablet Take 1 tablet (0.5 mg total) by mouth at bedtime. (Patient taking differently: Take 0.5 mg by mouth as needed. )   . Calcium-Magnesium-Vitamin D (CALCIUM 500 PO) Take by  mouth. am 11/26/2014: Received from: [facility]  . cetirizine (ZYRTEC) 10 MG tablet Take by mouth as needed.  11/26/2014: Received from: [facility]  . furosemide (LASIX) 20 MG tablet Take 1 tablet (20 mg total) by mouth daily. (Patient taking differently: Take 20 mg by mouth daily. ) 01/16/2016: PRN, last taken 2-3 months ago   . hydrochlorothiazide (HYDRODIURIL) 25 MG tablet TAKE 1 TABLET EVERY DAY   . levothyroxine (SYNTHROID, LEVOTHROID) 50 MCG tablet TAKE 1 AND 1/2 TABLETS EVERY DAY BEFORE BREAKFAST (Patient taking differently: 1 daily)   . Multiple Vitamins-Minerals (MULTIVITAMIN ADULT PO) Take by mouth.  am 11/26/2014: Received from: [facility]  . pantoprazole (PROTONIX) 40 MG tablet TAKE 1 TABLET (40 MG TOTAL) BY MOUTH 2 (TWO) TIMES DAILY.   . simvastatin (ZOCOR) 20 MG tablet TAKE 1 TABLET AT BEDTIME    No facility-administered encounter medications on file as of 06/03/2017.     Allergies  Allergen Reactions  . Sulfa Antibiotics Itching   .vit Review of Systems  Constitutional: Negative for fever and malaise/fatigue.  Respiratory: Positive for cough, sputum production (Clear and only in the morning.) and wheezing. Negative for shortness of breath.   Cardiovascular: Negative for chest pain, palpitations, orthopnea, claudication and leg swelling.  Neurological: Negative for weakness.    Objective:   Today's Vitals   06/03/17 0827 06/03/17 0828  BP: (!) 150/70 138/70  Pulse: 76   Resp: 14   Temp: (!) 97.5 F (36.4 C)   TempSrc: Oral   Weight: 170 lb (77.1 kg)     Physical Exam  Constitutional: She is oriented to person, place, and time and well-developed, well-nourished, and in no distress.  HENT:  Head: Normocephalic and atraumatic.  Eyes: Conjunctivae are normal. Pupils are equal, round, and reactive to light. No scleral icterus.  Neck: Normal range of motion. Neck supple. No thyromegaly present.  Cardiovascular: Normal rate, regular rhythm and normal heart sounds.  Pulmonary/Chest: Effort normal and breath sounds normal.  Neurological: She is alert and oriented to person, place, and time. GCS score is 15.  Skin: Skin is warm and dry.  Psychiatric: Mood, memory, affect and judgment normal.    Assessment and Plan :   1. Essential (primary) hypertension   2. Adult hypothyroidism  - TSH  3. Hypercholesteremia  I,Elena D DeSanto,acting as a scribe for [name], MD.,have documented all relevant documentation on the behalf of [name], MD,as directed by  [name], MD while in the presence of [name], MD. I  have done the exam and reviewed the chart and it is accurate to the best of my knowledge. Development worker, community has been used and  any errors in dictation or transcription are unintentional. Miguel Aschoff M.D. [facility]

## 2017-06-03 NOTE — Patient Instructions (Signed)
Robitussin or the generic store brand- take recommended dose once in the morning and once at night for cough.

## 2017-06-04 ENCOUNTER — Telehealth: Payer: Self-pay

## 2017-06-04 NOTE — Telephone Encounter (Signed)
-----   Message from Jerrol Banana., MD sent at 06/04/2017  3:55 PM EST ----- Ok--recheck TSH next OV.

## 2017-06-04 NOTE — Telephone Encounter (Signed)
Patient advised as directed below.  Thanks,  -Carel Schnee 

## 2017-07-20 IMAGING — MG MM DIGITAL SCREENING BILAT W/ TOMO W/ CAD
8 of 12 series · 8 of 28 positions shown · non-contrast
Comparison: Previous exam(s).

CLINICAL DATA: Screening.

EXAM:
2D DIGITAL SCREENING BILATERAL MAMMOGRAM WITH CAD AND ADJUNCT TOMO

[R MLO]
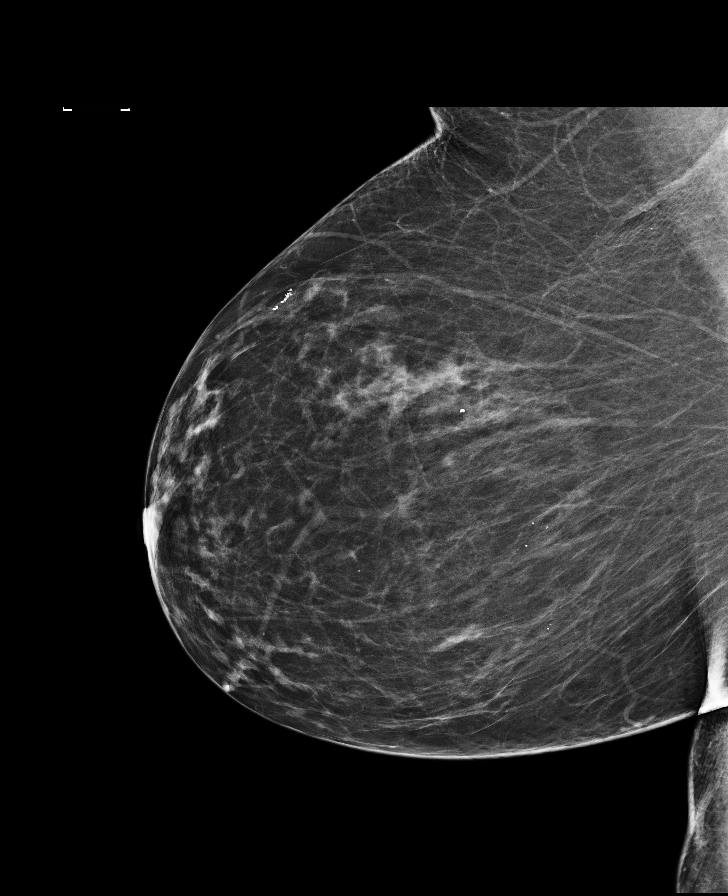

[L MLO synth-2D]
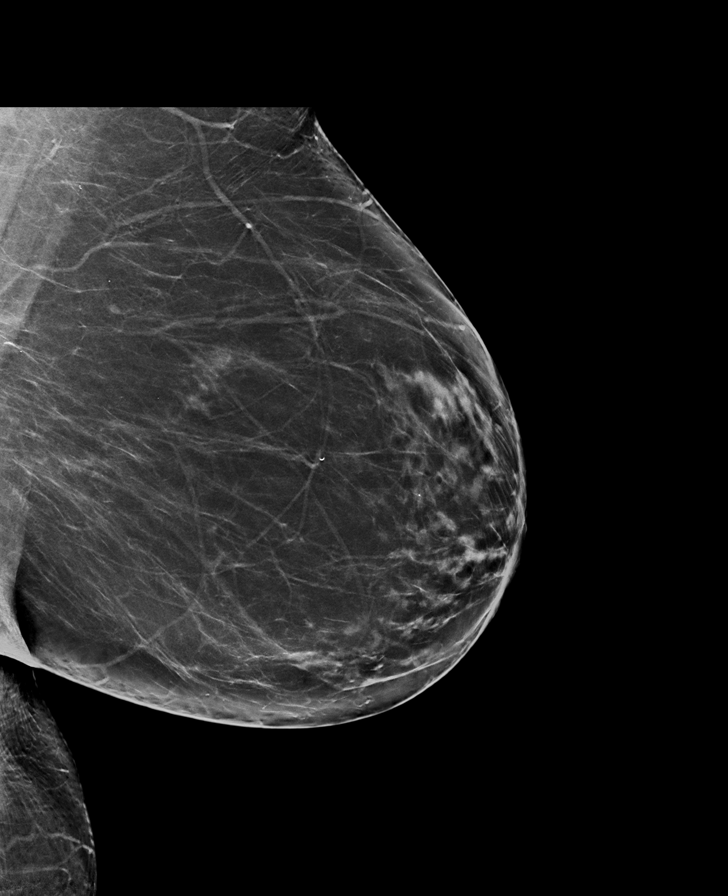

[L MLO]
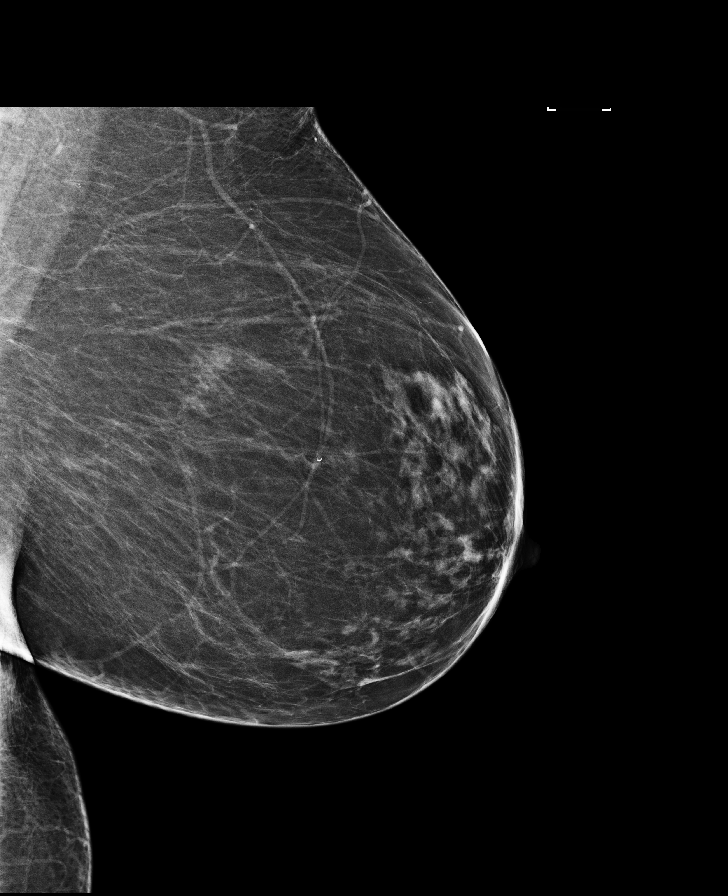

[L CC]
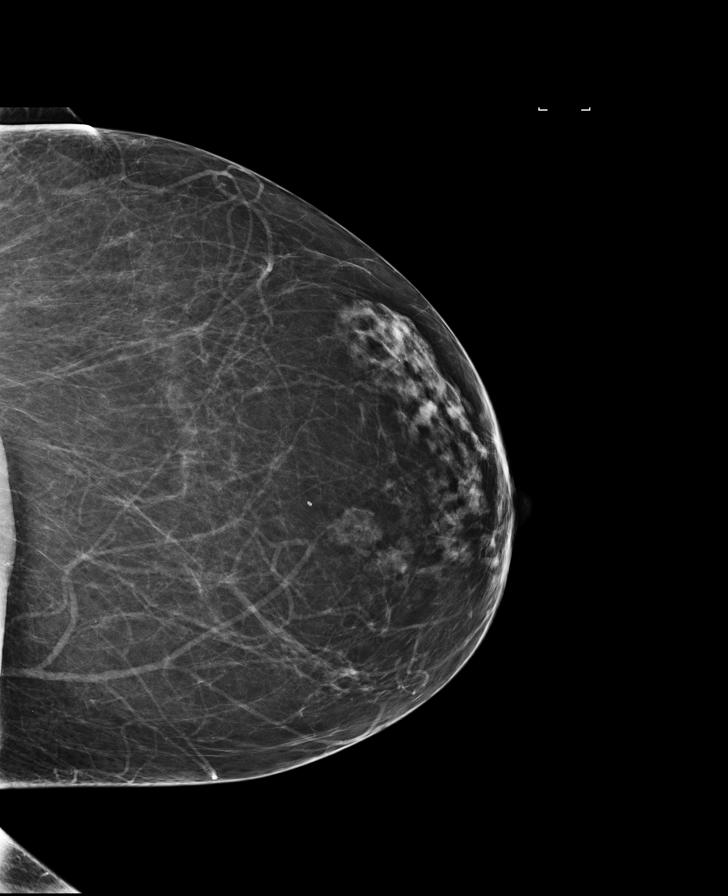

[R CC]
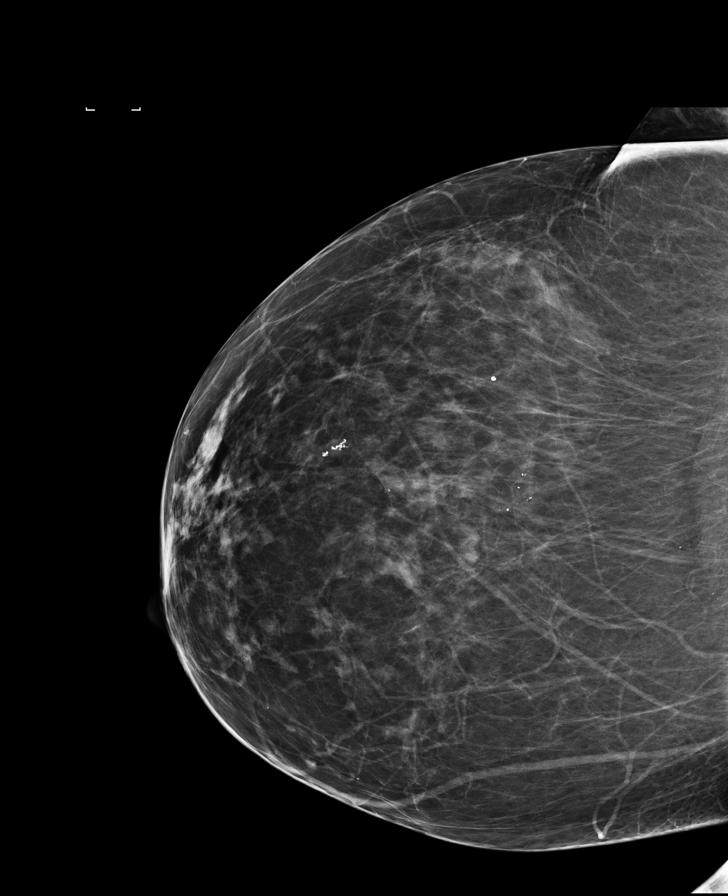

[R CC synth-2D]
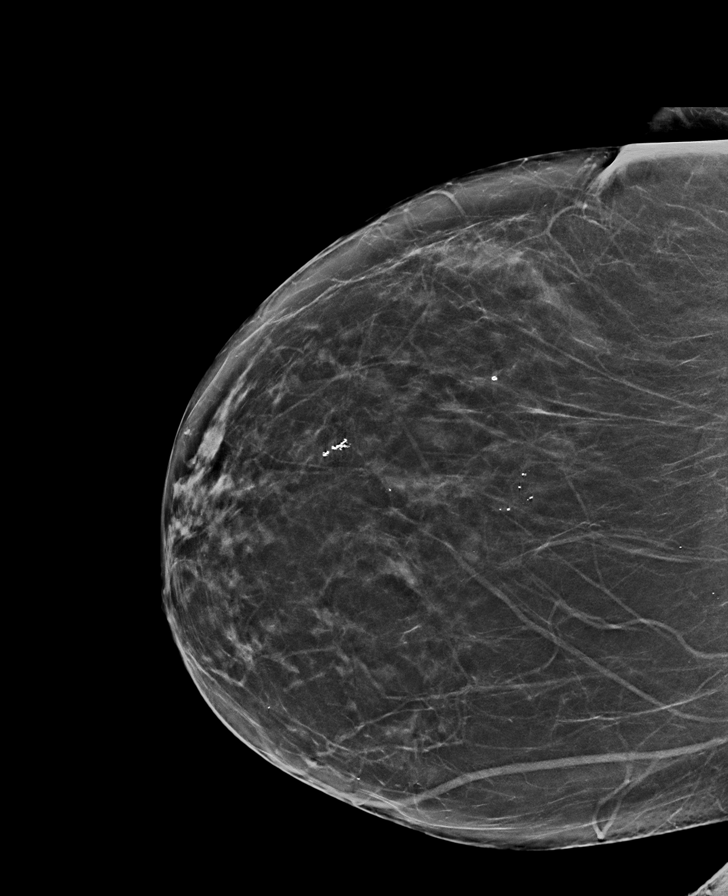

[L CC synth-2D]
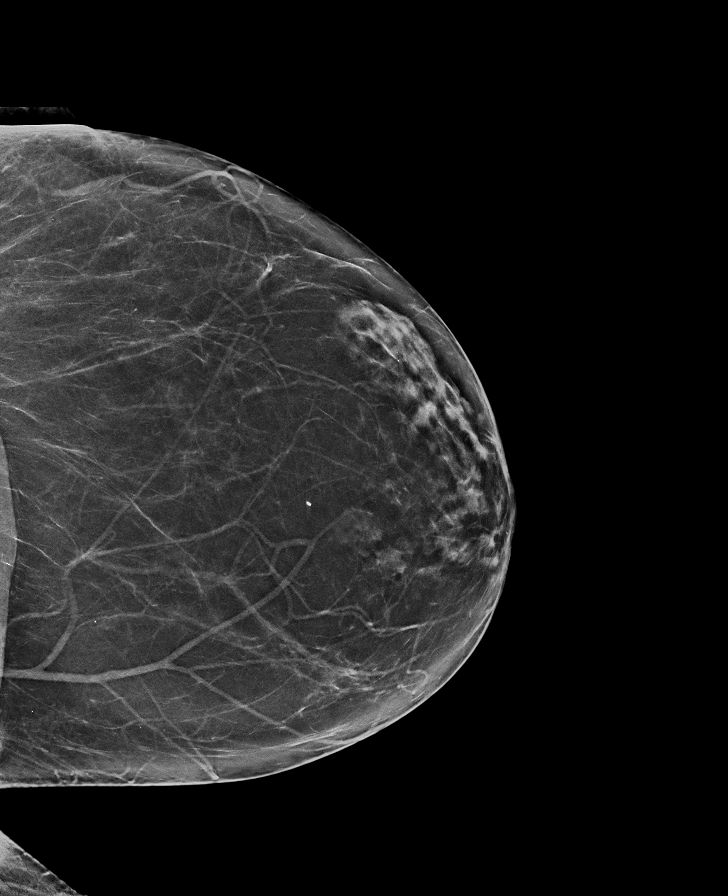

[R MLO synth-2D]
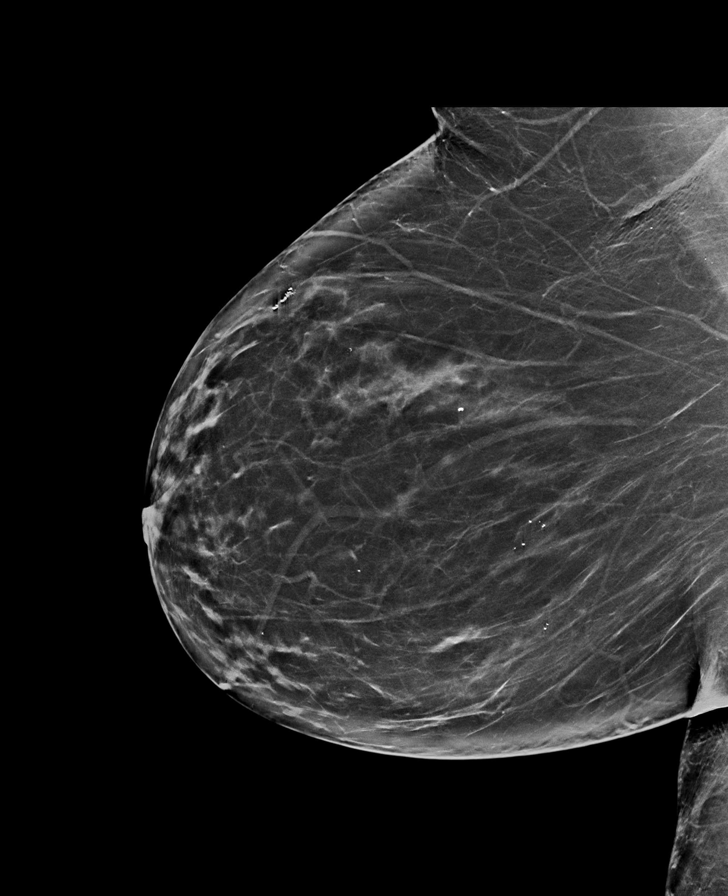

[8 of 28 positions shown; findings below may reference images not displayed]

ACR Breast Density Category c: The breast tissue is heterogeneously
dense, which may obscure small masses.
FINDINGS: There are no findings suspicious for malignancy. Images were
processed with CAD.
IMPRESSION: No mammographic evidence of malignancy. A result letter of this
screening mammogram will be mailed directly to the patient.

RECOMMENDATION:
Screening mammogram in one year. (Code:[id number])

BI-RADS CATEGORY  1: Negative.

## 2017-07-24 ENCOUNTER — Other Ambulatory Visit: Payer: Self-pay | Admitting: Family Medicine

## 2017-07-24 DIAGNOSIS — I10 Essential (primary) hypertension: Secondary | ICD-10-CM

## 2017-07-24 MED ORDER — HYDROCHLOROTHIAZIDE 25 MG PO TABS: 25.0000 mg | ORAL_TABLET | Freq: Every day | ORAL | 3 refills | Status: DC

## 2017-07-24 NOTE — Telephone Encounter (Signed)
Med sent in.

## 2017-07-24 NOTE — Telephone Encounter (Signed)
Patient needs refills on HCTZ called [facility]

## 2017-12-03 ENCOUNTER — Ambulatory Visit (INDEPENDENT_AMBULATORY_CARE_PROVIDER_SITE_OTHER): Payer: Medicare HMO

## 2017-12-03 VITALS — BP 146/78 | HR 70 | Temp 97.8°F | Ht 62.0 in | Wt 170.8 lb

## 2017-12-03 DIAGNOSIS — Z0000 Encounter for general adult medical examination without abnormal findings: Secondary | ICD-10-CM | POA: Diagnosis not present

## 2017-12-03 NOTE — Patient Instructions (Addendum)
Ms. Lynn Williams , Thank you for taking time to come for your Medicare Wellness Visit. I appreciate your ongoing commitment to your health goals. Please review the following plan we discussed and let me know if I can assist you in the future.   Screening recommendations/referrals: Colonoscopy: Up to date Mammogram: Up to date Bone Density: Up to date Recommended yearly ophthalmology/optometry visit for glaucoma screening and checkup Recommended yearly dental visit for hygiene and checkup  Vaccinations: Influenza vaccine: Up to date Pneumococcal vaccine: Up to date Tdap vaccine: Up to date Shingles vaccine: Pt declines today.     Advanced directives: Please bring a copy of your POA (Power of Attorney) and/or Living Will to your next appointment.   Conditions/risks identified: Obesity- recommend cutting out all sugar in diet to help aid in weight loss and decrease BMI.  Next appointment: 12/05/17 @ 9:00 AM with Dr Rosanna Randy. Pt declined setting up the AWV for 2020.   Preventive Care 82 Years and Older, Female Preventive care refers to lifestyle choices and visits with your health care provider that can promote health and wellness. What does preventive care include?  A yearly physical exam. This is also called an annual well check.  Dental exams once or twice a year.  Routine eye exams. Ask your health care provider how often you should have your eyes checked.  Personal lifestyle choices, including:  Daily care of your teeth and gums.  Regular physical activity.  Eating a healthy diet.  Avoiding tobacco and drug use.  Limiting alcohol use.  Practicing safe sex.  Taking low-dose aspirin every day.  Taking vitamin and mineral supplements as recommended by your health care provider. What happens during an annual well check? The services and screenings done by your health care provider during your annual well check will depend on your age, overall health, lifestyle risk  factors, and family history of disease. Counseling  Your health care provider may ask you questions about your:  Alcohol use.  Tobacco use.  Drug use.  Emotional well-being.  Home and relationship well-being.  Sexual activity.  Eating habits.  History of falls.  Memory and ability to understand (cognition).  Work and work Statistician.  Reproductive health. Screening  You may have the following tests or measurements:  Height, weight, and BMI.  Blood pressure.  Lipid and cholesterol levels. These may be checked every 5 years, or more frequently if you are over 33 years old.  Skin check.  Lung cancer screening. You may have this screening every year starting at age 29 if you have a 30-pack-year history of smoking and currently smoke or have quit within the past 15 years.  Fecal occult blood test (FOBT) of the stool. You may have this test every year starting at age 21.  Flexible sigmoidoscopy or colonoscopy. You may have a sigmoidoscopy every 5 years or a colonoscopy every 10 years starting at age 78.  Hepatitis C blood test.  Hepatitis B blood test.  Sexually transmitted disease (STD) testing.  Diabetes screening. This is done by checking your blood sugar (glucose) after you have not eaten for a while (fasting). You may have this done every 1-3 years.  Bone density scan. This is done to screen for osteoporosis. You may have this done starting at age 12.  Mammogram. This may be done every 1-2 years. Talk to your health care provider about how often you should have regular mammograms. Talk with your health care provider about your test results, treatment options, and  if necessary, the need for more tests. Vaccines  Your health care provider may recommend certain vaccines, such as:  Influenza vaccine. This is recommended every year.  Tetanus, diphtheria, and acellular pertussis (Tdap, Td) vaccine. You may need a Td booster every 10 years.  Zoster vaccine. You  may need this after age 84.  Pneumococcal 13-valent conjugate (PCV13) vaccine. One dose is recommended after age 74.  Pneumococcal polysaccharide (PPSV23) vaccine. One dose is recommended after age 47. Talk to your health care provider about which screenings and vaccines you need and how often you need them. This information is not intended to replace advice given to you by your health care provider. Make sure you discuss any questions you have with your health care provider. Document Released: 08/05/2015 Document Revised: 03/28/2016 Document Reviewed: 05/10/2015 Elsevier Interactive Patient Education  2017 [place] Prevention in the Home Falls can cause injuries. They can happen to people of all ages. There are many things you can do to make your home safe and to help prevent falls. What can I do on the outside of my home?  Regularly fix the edges of walkways and driveways and fix any cracks.  Remove anything that might make you trip as you walk through a door, such as a raised step or threshold.  Trim any bushes or trees on the path to your home.  Use bright outdoor lighting.  Clear any walking paths of anything that might make someone trip, such as rocks or tools.  Regularly check to see if handrails are loose or broken. Make sure that both sides of any steps have handrails.  Any raised decks and porches should have guardrails on the edges.  Have any leaves, snow, or ice cleared regularly.  Use sand or salt on walking paths during winter.  Clean up any spills in your garage right away. This includes oil or grease spills. What can I do in the bathroom?  Use night lights.  Install grab bars by the toilet and in the tub and shower. Do not use towel bars as grab bars.  Use non-skid mats or decals in the tub or shower.  If you need to sit down in the shower, use a plastic, non-slip stool.  Keep the floor dry. Clean up any water that spills on the floor as soon as  it happens.  Remove soap buildup in the tub or shower regularly.  Attach bath mats securely with double-sided non-slip rug tape.  Do not have throw rugs and other things on the floor that can make you trip. What can I do in the bedroom?  Use night lights.  Make sure that you have a light by your bed that is easy to reach.  Do not use any sheets or blankets that are too big for your bed. They should not hang down onto the floor.  Have a firm chair that has side arms. You can use this for support while you get dressed.  Do not have throw rugs and other things on the floor that can make you trip. What can I do in the kitchen?  Clean up any spills right away.  Avoid walking on wet floors.  Keep items that you use a lot in easy-to-reach places.  If you need to reach something above you, use a strong step stool that has a grab bar.  Keep electrical cords out of the way.  Do not use floor polish or wax that makes floors slippery. If you  must use wax, use non-skid floor wax.  Do not have throw rugs and other things on the floor that can make you trip. What can I do with my stairs?  Do not leave any items on the stairs.  Make sure that there are handrails on both sides of the stairs and use them. Fix handrails that are broken or loose. Make sure that handrails are as long as the stairways.  Check any carpeting to make sure that it is firmly attached to the stairs. Fix any carpet that is loose or worn.  Avoid having throw rugs at the top or bottom of the stairs. If you do have throw rugs, attach them to the floor with carpet tape.  Make sure that you have a light switch at the top of the stairs and the bottom of the stairs. If you do not have them, ask someone to add them for you. What else can I do to help prevent falls?  Wear shoes that:  Do not have high heels.  Have rubber bottoms.  Are comfortable and fit you well.  Are closed at the toe. Do not wear sandals.  If  you use a stepladder:  Make sure that it is fully opened. Do not climb a closed stepladder.  Make sure that both sides of the stepladder are locked into place.  Ask someone to hold it for you, if possible.  Clearly mark and make sure that you can see:  Any grab bars or handrails.  First and last steps.  Where the edge of each step is.  Use tools that help you move around (mobility aids) if they are needed. These include:  Canes.  Walkers.  Scooters.  Crutches.  Turn on the lights when you go into a dark area. Replace any light bulbs as soon as they burn out.  Set up your furniture so you have a clear path. Avoid moving your furniture around.  If any of your floors are uneven, fix them.  If there are any pets around you, be aware of where they are.  Review your medicines with your doctor. Some medicines can make you feel dizzy. This can increase your chance of falling. Ask your doctor what other things that you can do to help prevent falls. This information is not intended to replace advice given to you by your health care provider. Make sure you discuss any questions you have with your health care provider. Document Released: 05/05/2009 Document Revised: 12/15/2015 Document Reviewed: 08/13/2014 Elsevier Interactive Patient Education  2017 [employer].

## 2017-12-03 NOTE — Progress Notes (Signed)
Subjective:   Lynn Williams is a 84 y.o. female who presents for Medicare Annual (Subsequent) preventive examination.  Review of Systems:  N/A  Cardiac Risk Factors include: advanced age (>53men, >63 women);dyslipidemia;hypertension;obesity (BMI >30kg/m2)     Objective:     Vitals: BP (!) 146/78 (BP Location: Left Arm)   Pulse 70   Temp 97.8 F (36.6 C) (Oral)   Ht 5\' 2"  (1.575 m)   Wt 170 lb 12.8 oz (77.5 kg)   LMP  (LMP Unknown)   BMI 31.24 kg/m   Body mass index is 31.24 kg/m.  Advanced Directives 12/03/2017 08/28/2016 01/16/2016 12/22/2015 11/28/2015 01/25/2015  Does Patient Have a Medical Advance Directive? Yes Yes Yes Yes Yes Yes  Type of Paramedic of [place];Living will Living will;Healthcare Power of [place];Living will [place];Living will - Living will  Does patient want to make changes to medical advance directive? - - No - Patient declined - - -  Copy of [place] in Chart? No - copy requested No - copy requested No - copy requested - - -    Tobacco Social History   Tobacco Use  Smoking Status Never Smoker  Smokeless Tobacco Never Used     Counseling given: Not Answered   Clinical Intake:  Pre-visit preparation completed: Yes  Pain : No/denies pain Pain Score: 0-No pain     Nutritional Status: BMI > 30  Obese Nutritional Risks: None Diabetes: No  How often do you need to have someone help you when you read instructions, pamphlets, or other written materials from your doctor or pharmacy?: 1 - Never  Interpreter Needed?: No  Information entered by :: [facility], LPN  Past Medical History:  Diagnosis Date  . Arthritis    hands and wrist and knees  . Cough    chronic/allergies  . GERD (gastroesophageal reflux disease)   . Hyperlipidemia   . Hypertension    controlled on meds  . Hypothyroidism   . Wears partial dentures    upper   Past Surgical  History:  Procedure Laterality Date  . BREAST BIOPSY Right 1978   benign  . BREAST CYST ASPIRATION    . BREAST LUMPECTOMY Right   . CATARACT EXTRACTION Bilateral   . ESOPHAGOGASTRODUODENOSCOPY (EGD) WITH PROPOFOL N/A 01/16/2016   Procedure: ESOPHAGOGASTRODUODENOSCOPY (EGD) WITH PROPOFOL;  Surgeon: Lucilla Lame, MD;  Location: [facility];  Service: Endoscopy;  Laterality: N/A;  . TUBAL LIGATION     Family History  Problem Relation Age of Onset  . Hypertension Mother   . Heart attack Mother   . Stroke Mother   . Prostate cancer Father   . Heart attack Brother   . Stroke Sister   . Heart attack Sister   . Coronary artery disease Sister   . Alzheimer's disease Sister   . Heart attack Brother   . COPD Brother   . Heart attack Sister   . Coronary artery disease Sister   . Diabetes Sister   . Hypertension Sister   . Heart disease Sister   . Heart disease Brother   . Heart disease Brother   . COPD Brother   . Breast cancer Neg Hx    Social History   Socioeconomic History  . Marital status: Married    Spouse name: Not on file  . Number of children: 0  . Years of education: Not on file  . Highest education level: GED or equivalent  Occupational History  . Occupation: retired  Scientific laboratory technician  . Financial resource strain: Not hard at all  . Food insecurity:    Worry: Never true    Inability: Never true  . Transportation needs:    Medical: No    Non-medical: No  Tobacco Use  . Smoking status: Never Smoker  . Smokeless tobacco: Never Used  Substance and Sexual Activity  . Alcohol use: No    Alcohol/week: 0.0 oz  . Drug use: No  . Sexual activity: Not on file  Lifestyle  . Physical activity:    Days per week: Not on file    Minutes per session: Not on file  . Stress: Only a little  Relationships  . Social connections:    Talks on phone: Not on file    Gets together: Not on file    Attends religious service: Not on file    Active member of club or  organization: Not on file    Attends meetings of clubs or organizations: Not on file    Relationship status: Not on file  Other Topics Concern  . Not on file  Social History Narrative  . Not on file    Outpatient Encounter Medications as of 12/03/2017  Medication Sig  . ALPRAZolam (XANAX) 0.5 MG tablet Take 1 tablet (0.5 mg total) by mouth at bedtime. (Patient taking differently: Take 0.5 mg by mouth as needed. )  . Calcium-Magnesium-Vitamin D (CALCIUM 500 PO) Take by mouth. am  . cetirizine (ZYRTEC) 10 MG tablet Take by mouth as needed.   . furosemide (LASIX) 20 MG tablet Take 1 tablet (20 mg total) by mouth daily. (Patient taking differently: Take 20 mg by mouth daily. )  . hydrochlorothiazide (HYDRODIURIL) 25 MG tablet Take 1 tablet (25 mg total) by mouth daily.  Marland Kitchen levothyroxine (SYNTHROID, LEVOTHROID) 50 MCG tablet TAKE 1 AND 1/2 TABLETS EVERY DAY BEFORE BREAKFAST (Patient taking differently: 1 daily)  . Multiple Vitamins-Minerals (MULTIVITAMIN ADULT PO) Take by mouth. am  . pantoprazole (PROTONIX) 40 MG tablet TAKE 1 TABLET (40 MG TOTAL) BY MOUTH 2 (TWO) TIMES DAILY.  . simvastatin (ZOCOR) 20 MG tablet TAKE 1 TABLET AT BEDTIME   No facility-administered encounter medications on file as of 12/03/2017.     Activities of Daily Living In your present state of health, do you have any difficulty performing the following activities: 12/03/2017  Hearing? N  Vision? N  Difficulty concentrating or making decisions? N  Walking or climbing stairs? N  Dressing or bathing? N  Doing errands, shopping? N  Preparing Food and eating ? N  Using the Toilet? N  In the past six months, have you accidently leaked urine? N  Do you have problems with loss of bowel control? Y  Comment Leakage occasionally but wears protection.  Managing your Medications? N  Managing your Finances? N  Housekeeping or managing your Housekeeping? N  Some recent data might be hidden    Patient Care Team: Jerrol Banana., MD as PCP - General (Family Medicine) Birder Robson, MD as Referring Physician (Ophthalmology)    Assessment:   This is a routine wellness examination for Lynn Williams.  Exercise Activities and Dietary recommendations Current Exercise Habits: Structured exercise class, Type of exercise: stretching;strength training/weights;treadmill;walking, Time (Minutes): 60, Frequency (Times/Week): 4, Weekly Exercise (Minutes/Week): 240, Intensity: Mild, Exercise limited by: None identified  Goals    . DIET - REDUCE SUGAR INTAKE     Recommend cutting out all sugar in diet to  help aid in weight loss and decrease BMI.       Fall Risk Fall Risk  12/03/2017 08/28/2016 08/25/2015 01/25/2015  Falls in the past year? No No No No   Is the patient's home free of loose throw rugs in walkways, pet beds, electrical cords, etc?   yes      Grab bars in the bathroom? no      Handrails on the stairs?   yes      Adequate lighting?   yes  Timed Get Up and Go performed: N/A  Depression Screen PHQ 2/9 Scores 12/03/2017 08/28/2016 08/25/2015 01/25/2015  PHQ - 2 Score 0 0 0 0     Cognitive Function: Pt declined screening today.     6CIT Screen 08/28/2016  What Year? 0 points  What month? 0 points  What time? 0 points  Count back from 20 0 points  Months in reverse 2 points  Repeat phrase 0 points  Total Score 2    Immunization History  Administered Date(s) Administered  . Influenza, High Dose Seasonal PF 05/04/2015, 05/14/2016, 05/08/2017  . Pneumococcal Conjugate-13 12/23/2013  . Pneumococcal Polysaccharide-23 07/24/1996, 08/04/2003    Qualifies for Shingles Vaccine? Due for Shingles vaccine. Declined my offer to administer today. Education has been provided regarding the importance of this vaccine. Pt has been advised to call her insurance company to determine her out of pocket expense. Advised she may also receive this vaccine at her local pharmacy or Health Dept. Verbalized acceptance and  understanding.  Screening Tests Health Maintenance  Topic Date Due  . INFLUENZA VACCINE  02/20/2018  . TETANUS/TDAP  12/24/2023  . DEXA SCAN  Completed  . PNA vac Low Risk Adult  Completed    Cancer Screenings: Lung: Low Dose CT Chest recommended if Age 60-80 years, 30 pack-year currently smoking OR have quit w/in 15years. Patient does not qualify. Breast:  Up to date on Mammogram? Yes   Up to date of Bone Density/Dexa? Yes Colorectal: Up to date  Additional Screenings:  Hepatitis C Screening: N/A     Plan:  I have personally reviewed and addressed the Medicare Annual Wellness questionnaire and have noted the following in the patient's chart:  A. Medical and social history B. Use of alcohol, tobacco or illicit drugs  C. Current medications and supplements D. Functional ability and status E.  Nutritional status F.  Physical activity G. Advance directives H. List of other physicians I.  Hospitalizations, surgeries, and ER visits in previous 12 months J.  [place] such as hearing and vision if needed, cognitive and depression L. Referrals and appointments - none  In addition, I have reviewed and discussed with patient certain preventive protocols, quality metrics, and best practice recommendations. A written personalized care plan for preventive services as well as general preventive health recommendations were provided to patient.  See attached scanned questionnaire for additional information.   Signed,  Fabio Neighbors, LPN Nurse Health Advisor   Nurse Recommendations: None.

## 2017-12-05 ENCOUNTER — Ambulatory Visit (INDEPENDENT_AMBULATORY_CARE_PROVIDER_SITE_OTHER): Payer: Medicare HMO | Admitting: Family Medicine

## 2017-12-05 ENCOUNTER — Encounter: Payer: Self-pay | Admitting: Family Medicine

## 2017-12-05 VITALS — BP 152/80 | HR 64 | Temp 97.6°F | Resp 14 | Ht 62.0 in | Wt 168.0 lb

## 2017-12-05 DIAGNOSIS — E7800 Pure hypercholesterolemia, unspecified: Secondary | ICD-10-CM

## 2017-12-05 DIAGNOSIS — Z0000 Encounter for general adult medical examination without abnormal findings: Secondary | ICD-10-CM

## 2017-12-05 DIAGNOSIS — I10 Essential (primary) hypertension: Secondary | ICD-10-CM

## 2017-12-05 DIAGNOSIS — R1013 Epigastric pain: Secondary | ICD-10-CM | POA: Diagnosis not present

## 2017-12-05 DIAGNOSIS — Z1231 Encounter for screening mammogram for malignant neoplasm of breast: Secondary | ICD-10-CM | POA: Diagnosis not present

## 2017-12-05 DIAGNOSIS — E039 Hypothyroidism, unspecified: Secondary | ICD-10-CM

## 2017-12-05 DIAGNOSIS — Z1239 Encounter for other screening for malignant neoplasm of breast: Secondary | ICD-10-CM

## 2017-12-05 NOTE — Progress Notes (Signed)
Patient: Lynn Williams, Female    DOB: December 24, 1931, 84 y.o.   MRN: [id number] Visit Date: 12/05/2017  Today's Provider: [name], MD   Chief Complaint  Patient presents with  . Annual Exam   Subjective:    Annual physical exam Lynn Williams is a 84 y.o. female who presents today for health maintenance and complete physical. She feels well. She reports she is not exercising. She reports she is sleeping poorly.  ----------------------------------------------------------------- Pt had her AWV on 12/03/17 BMD- 09/22/12 Colonoscopy- 12/17/06  Mammogram- 09/26/16 Cologuard 09/19/16 negative    Endometrial Biopsy by Dr. Enzo Bi Benign.   Review of Systems  Constitutional: Negative.   HENT: Negative.   Eyes: Negative.   Respiratory: Negative.   Cardiovascular: Negative.   Gastrointestinal: Negative.   Endocrine: Negative.   Genitourinary: Negative.   Musculoskeletal: Negative.   Skin: Negative.   Allergic/Immunologic: Negative.   Neurological: Negative.   Hematological: Negative.   Psychiatric/Behavioral: Negative.     Social History      She  reports that she has never smoked. She has never used smokeless tobacco. She reports that she does not drink alcohol or use drugs.       Social History   Socioeconomic History  . Marital status: Married    Spouse name: Not on file  . Number of children: 0  . Years of education: Not on file  . Highest education level: GED or equivalent  Occupational History  . Occupation: retired  Scientific laboratory technician  . Financial resource strain: Not hard at all  . Food insecurity:    Worry: Never true    Inability: Never true  . Transportation needs:    Medical: No    Non-medical: No  Tobacco Use  . Smoking status: Never Smoker  . Smokeless tobacco: Never Used  Substance and Sexual Activity  . Alcohol use: No    Alcohol/week: 0.0 oz  . Drug use: No  . Sexual activity: Not on file  Lifestyle  . Physical activity:      Days per week: Not on file    Minutes per session: Not on file  . Stress: Only a little  Relationships  . Social connections:    Talks on phone: Not on file    Gets together: Not on file    Attends religious service: Not on file    Active member of club or organization: Not on file    Attends meetings of clubs or organizations: Not on file    Relationship status: Not on file  Other Topics Concern  . Not on file  Social History Narrative  . Not on file    Past Medical History:  Diagnosis Date  . Arthritis    hands and wrist and knees  . Cough    chronic/allergies  . GERD (gastroesophageal reflux disease)   . Hyperlipidemia   . Hypertension    controlled on meds  . Hypothyroidism   . Wears partial dentures    upper     Patient Active Problem List   Diagnosis Date Noted  . Abdominal pain, epigastric   . Gastritis   . Hiatal hernia   . Cervical stenosis (uterine cervix) 01/12/2016  . Abnormal ultrasound of endometrium 01/12/2016  . Abnormal collection of fluid in uterine cavity 01/12/2016  . Pelvic pain in female 01/12/2016  . Medicare annual wellness visit, subsequent 01/25/2015  . Allergic rhinitis 11/26/2014  . Basal cell carcinoma of nose 11/26/2014  .  DD (diverticular disease) 11/26/2014  . Bloodgood disease 11/26/2014  . Essential (primary) hypertension 11/26/2014  . Hypercholesteremia 11/26/2014  . Adult hypothyroidism 11/26/2014  . Cannot sleep 11/26/2014  . Arthritis, degenerative 11/26/2014    Past Surgical History:  Procedure Laterality Date  . BREAST BIOPSY Right 1978   benign  . BREAST CYST ASPIRATION    . BREAST LUMPECTOMY Right   . CATARACT EXTRACTION Bilateral   . ESOPHAGOGASTRODUODENOSCOPY (EGD) WITH PROPOFOL N/A 01/16/2016   Procedure: ESOPHAGOGASTRODUODENOSCOPY (EGD) WITH PROPOFOL;  Surgeon: Lucilla Lame, MD;  Location: [facility];  Service: Endoscopy;  Laterality: N/A;  . TUBAL LIGATION      Family History        Family  Status  Relation Name Status  . Mother  Deceased  . Father  Deceased  . Brother  Deceased  . Sister  Deceased  . Brother  Deceased  . Sister  Alive  . Sister  (Not Specified)  . Sister  (Not Specified)  . Brother  (Not Specified)  . Brother  (Not Specified)  . Brother  (Not Specified)  . Neg Hx  (Not Specified)        Her family history includes Alzheimer's disease in her sister; COPD in her brother and brother; Coronary artery disease in her sister and sister; Diabetes in her sister; Heart attack in her brother, brother, mother, sister, and sister; Heart disease in her brother, brother, and sister; Hypertension in her mother and sister; Prostate cancer in her father; Stroke in her mother and sister. There is no history of Breast cancer.      Allergies  Allergen Reactions  . Sulfa Antibiotics Itching     Current Outpatient Medications:  .  ALPRAZolam (XANAX) 0.5 MG tablet, Take 1 tablet (0.5 mg total) by mouth at bedtime. (Patient taking differently: Take 0.5 mg by mouth as needed. ), Disp: 90 tablet, Rfl: 1 .  Calcium-Magnesium-Vitamin D (CALCIUM 500 PO), Take by mouth. am, Disp: , Rfl:  .  cetirizine (ZYRTEC) 10 MG tablet, Take by mouth as needed. , Disp: , Rfl:  .  furosemide (LASIX) 20 MG tablet, Take 1 tablet (20 mg total) by mouth daily. (Patient taking differently: Take 20 mg by mouth daily. ), Disp: 90 tablet, Rfl: 3 .  hydrochlorothiazide (HYDRODIURIL) 25 MG tablet, Take 1 tablet (25 mg total) by mouth daily., Disp: 90 tablet, Rfl: 3 .  levothyroxine (SYNTHROID, LEVOTHROID) 50 MCG tablet, TAKE 1 AND 1/2 TABLETS EVERY DAY BEFORE BREAKFAST (Patient taking differently: 1 daily), Disp: 135 tablet, Rfl: 3 .  Multiple Vitamins-Minerals (MULTIVITAMIN ADULT PO), Take by mouth. am, Disp: , Rfl:  .  pantoprazole (PROTONIX) 40 MG tablet, TAKE 1 TABLET (40 MG TOTAL) BY MOUTH 2 (TWO) TIMES DAILY., Disp: 180 tablet, Rfl: 3 .  simvastatin (ZOCOR) 20 MG tablet, TAKE 1 TABLET AT BEDTIME,  Disp: 90 tablet, Rfl: 3   Patient Care Team: Jerrol Banana., MD as PCP - General (Family Medicine) Birder Robson, MD as Referring Physician (Ophthalmology)      Objective:   Vitals: BP (!) 152/80 (BP Location: Left Arm, Patient Position: Sitting, Cuff Size: Normal)   Pulse 64   Temp 97.6 F (36.4 C) (Oral)   Resp 14   Ht 5\' 2"  (1.575 m)   Wt 168 lb (76.2 kg)   LMP  (LMP Unknown)   BMI 30.73 kg/m    Vitals:   12/05/17 0919  BP: (!) 152/80  Pulse: 64  Resp: 14  Temp:  97.6 F (36.4 C)  TempSrc: Oral  Weight: 168 lb (76.2 kg)  Height: 5\' 2"  (1.575 m)     Physical Exam  Constitutional: She is oriented to person, place, and time. She appears well-developed and well-nourished.  HENT:  Head: Normocephalic and atraumatic.  Right Ear: External ear normal.  Left Ear: External ear normal.  Nose: Nose normal.  Mouth/Throat: Oropharynx is clear and moist.  Upper dentures.  Eyes: Pupils are equal, round, and reactive to light. Conjunctivae and EOM are normal.  Neck: Normal range of motion. Neck supple.  Cardiovascular: Normal rate, regular rhythm, normal heart sounds and intact distal pulses.  Pulmonary/Chest: Effort normal and breath sounds normal.  Abdominal: Soft. Bowel sounds are normal.  Genitourinary:  Genitourinary Comments: Breast exam without mass.  Musculoskeletal: Normal range of motion.  Neurological: She is alert and oriented to person, place, and time.  Skin: Skin is warm and dry.  Psychiatric: She has a normal mood and affect. Her behavior is normal. Thought content normal.     Depression Screen PHQ 2/9 Scores 12/03/2017 08/28/2016 08/25/2015 01/25/2015  PHQ - 2 Score 0 0 0 0      Assessment & Plan:     Routine Health Maintenance and Physical Exam  Exercise Activities and Dietary recommendations Goals    . DIET - REDUCE SUGAR INTAKE     Recommend cutting out all sugar in diet to help aid in weight loss and decrease BMI.        Immunization History  Administered Date(s) Administered  . Influenza, High Dose Seasonal PF 05/04/2015, 05/14/2016, 05/08/2017  . Pneumococcal Conjugate-13 12/23/2013  . Pneumococcal Polysaccharide-23 07/24/1996, 08/04/2003    Health Maintenance  Topic Date Due  . INFLUENZA VACCINE  02/20/2018  . TETANUS/TDAP  12/24/2023  . DEXA SCAN  Completed  . PNA vac Low Risk Adult  Completed     Discussed health benefits of physical activity, and encouraged her to engage in regular exercise appropriate for her age and condition.  GERD Improved on PPI. Stop Protonix. Edema Stop lasix.    --------------------------------------------------------------------   I have done the exam and reviewed the above chart and it is accurate to the best of my knowledge. Development worker, community has been used in this note in any air is in the dictation or transcription are unintentional.  [name], MD  [place]

## 2017-12-05 NOTE — Patient Instructions (Signed)
Decrease Pantoprazole to once daily

## 2017-12-06 LAB — CBC WITH DIFFERENTIAL/PLATELET
Basophils Absolute: 0.1 10*3/uL (ref 0.0–0.2)
Basos: 1 %
EOS (ABSOLUTE): 0.1 10*3/uL (ref 0.0–0.4)
EOS: 2 %
Hematocrit: 39.6 % (ref 34.0–46.6)
Hemoglobin: 13.6 g/dL (ref 11.1–15.9)
IMMATURE GRANS (ABS): 0 10*3/uL (ref 0.0–0.1)
IMMATURE GRANULOCYTES: 0 %
LYMPHS: 35 %
Lymphocytes Absolute: 1.9 10*3/uL (ref 0.7–3.1)
MCH: 30.4 pg (ref 26.6–33.0)
MCHC: 34.3 g/dL (ref 31.5–35.7)
MCV: 88 fL (ref 79–97)
MONOS ABS: 0.5 10*3/uL (ref 0.1–0.9)
Monocytes: 9 %
NEUTROS PCT: 53 %
Neutrophils Absolute: 2.9 10*3/uL (ref 1.4–7.0)
PLATELETS: 269 10*3/uL (ref 150–379)
RBC: 4.48 x10E6/uL (ref 3.77–5.28)
RDW: 13.5 % (ref 12.3–15.4)
WBC: 5.5 10*3/uL (ref 3.4–10.8)

## 2017-12-06 LAB — COMPREHENSIVE METABOLIC PANEL
A/G RATIO: 2 (ref 1.2–2.2)
ALBUMIN: 4.7 g/dL (ref 3.5–4.7)
ALT: 18 IU/L (ref 0–32)
AST: 32 IU/L (ref 0–40)
Alkaline Phosphatase: 69 IU/L (ref 39–117)
BUN / CREAT RATIO: 11 — AB (ref 12–28)
BUN: 11 mg/dL (ref 8–27)
Bilirubin Total: 0.5 mg/dL (ref 0.0–1.2)
CALCIUM: 10.3 mg/dL (ref 8.7–10.3)
CO2: 29 mmol/L (ref 20–29)
Chloride: 96 mmol/L (ref 96–106)
Creatinine, Ser: 1 mg/dL (ref 0.57–1.00)
GFR, EST AFRICAN AMERICAN: 60 mL/min/{1.73_m2} (ref >59)
GFR, EST NON AFRICAN AMERICAN: 52 mL/min/{1.73_m2} — AB (ref >59)
GLOBULIN, TOTAL: 2.3 g/dL (ref 1.5–4.5)
GLUCOSE: 84 mg/dL (ref 65–99)
POTASSIUM: 3.8 mmol/L (ref 3.5–5.2)
SODIUM: 139 mmol/L (ref 134–144)
Total Protein: 7 g/dL (ref 6.0–8.5)

## 2017-12-06 LAB — LIPID PANEL
Chol/HDL Ratio: 2.5 ratio (ref 0.0–4.4)
Cholesterol, Total: 151 mg/dL (ref 100–199)
HDL: 60 mg/dL (ref >39)
LDL CALC: 71 mg/dL (ref 0–99)
Triglycerides: 99 mg/dL (ref 0–149)
VLDL CHOLESTEROL CAL: 20 mg/dL (ref 5–40)

## 2017-12-06 LAB — TSH: TSH: 6.81 u[IU]/mL — AB (ref 0.450–4.500)

## 2017-12-10 ENCOUNTER — Telehealth: Payer: Self-pay

## 2017-12-10 NOTE — Telephone Encounter (Signed)
-----   Message from Jerrol Banana., MD sent at 12/06/2017  3:05 PM EDT ----- Increase synthroid from 50 to 75 mcg daily.

## 2017-12-10 NOTE — Telephone Encounter (Signed)
Tried calling patient, number was busy. Will try again later.  

## 2017-12-11 MED ORDER — LEVOTHYROXINE SODIUM 75 MCG PO TABS: 75.0000 ug | ORAL_TABLET | Freq: Every day | ORAL | 3 refills | Status: DC

## 2017-12-11 NOTE — Telephone Encounter (Signed)
LMT[facility] ED 

## 2017-12-14 ENCOUNTER — Other Ambulatory Visit: Payer: Self-pay | Admitting: Family Medicine

## 2017-12-14 DIAGNOSIS — R1013 Epigastric pain: Secondary | ICD-10-CM

## 2017-12-25 ENCOUNTER — Ambulatory Visit
Admission: RE | Admit: 2017-12-25 | Discharge: 2017-12-25 | Disposition: A | Discharge status: Home / Self Care | Payer: Commercial Managed Care - HMO | Source: Ambulatory Visit | Attending: Family Medicine | Admitting: Family Medicine

## 2017-12-25 DIAGNOSIS — Z1231 Encounter for screening mammogram for malignant neoplasm of breast: Secondary | ICD-10-CM | POA: Insufficient documentation

## 2017-12-25 DIAGNOSIS — Z1239 Encounter for other screening for malignant neoplasm of breast: Secondary | ICD-10-CM

## 2018-03-10 ENCOUNTER — Ambulatory Visit (INDEPENDENT_AMBULATORY_CARE_PROVIDER_SITE_OTHER): Payer: Medicare HMO | Admitting: Family Medicine

## 2018-03-10 VITALS — BP 158/80 | HR 70 | Temp 97.6°F | Resp 16 | Wt 165.0 lb

## 2018-03-10 DIAGNOSIS — I10 Essential (primary) hypertension: Secondary | ICD-10-CM

## 2018-03-10 DIAGNOSIS — E039 Hypothyroidism, unspecified: Secondary | ICD-10-CM | POA: Diagnosis not present

## 2018-03-10 MED ORDER — LOSARTAN POTASSIUM 50 MG PO TABS: 50.0000 mg | ORAL_TABLET | Freq: Every day | ORAL | 3 refills | Status: DC

## 2018-03-10 NOTE — Progress Notes (Signed)
Lynn Williams  MRN: [id number] DOB: [birth year]  Subjective:  HPI   The patient is an 84 year old female who presents for follow up of chronic health.  She was last seen in May for her annual wellness.  Hypertension-The patient states that her readings outside of the office are often better than we get here.  However she has gotten some readings comperable to what we get. Her systolic lately has been in the 130'2 and 140's.     BP Readings from Last 3 Encounters:  03/10/18 (!) 158/80  12/05/17 (!) 152/80  12/03/17 (!) 146/78   Hypothyroidism.  The patient had elevated TSH last check and her Levothyroxine was increased from 50-75 mcg.  She is due for repeat TSH   Patient Active Problem List   Diagnosis Date Noted  . Abdominal pain, epigastric   . Gastritis   . Hiatal hernia   . Cervical stenosis (uterine cervix) 01/12/2016  . Abnormal ultrasound of endometrium 01/12/2016  . Abnormal collection of fluid in uterine cavity 01/12/2016  . Pelvic pain in female 01/12/2016  . Medicare annual wellness visit, subsequent 01/25/2015  . Allergic rhinitis 11/26/2014  . Basal cell carcinoma of nose 11/26/2014  . DD (diverticular disease) 11/26/2014  . Bloodgood disease 11/26/2014  . Essential (primary) hypertension 11/26/2014  . Hypercholesteremia 11/26/2014  . Adult hypothyroidism 11/26/2014  . Cannot sleep 11/26/2014  . Arthritis, degenerative 11/26/2014    Past Medical History:  Diagnosis Date  . Arthritis    hands and wrist and knees  . Cough    chronic/allergies  . GERD (gastroesophageal reflux disease)   . Hyperlipidemia   . Hypertension    controlled on meds  . Hypothyroidism   . Wears partial dentures    upper    Social History   Socioeconomic History  . Marital status: Married    Spouse name: Not on file  . Number of children: 0  . Years of education: Not on file  . Highest education level: GED or equivalent  Occupational History  . Occupation: retired   Scientific laboratory technician  . Financial resource strain: Not hard at all  . Food insecurity:    Worry: Never true    Inability: Never true  . Transportation needs:    Medical: No    Non-medical: No  Tobacco Use  . Smoking status: Never Smoker  . Smokeless tobacco: Never Used  Substance and Sexual Activity  . Alcohol use: No    Alcohol/week: 0.0 standard drinks  . Drug use: No  . Sexual activity: Not on file  Lifestyle  . Physical activity:    Days per week: Not on file    Minutes per session: Not on file  . Stress: Only a little  Relationships  . Social connections:    Talks on phone: Not on file    Gets together: Not on file    Attends religious service: Not on file    Active member of club or organization: Not on file    Attends meetings of clubs or organizations: Not on file    Relationship status: Not on file  . Intimate partner violence:    Fear of current or ex partner: Not on file    Emotionally abused: Not on file    Physically abused: Not on file    Forced sexual activity: Not on file  Other Topics Concern  . Not on file  Social History Narrative  . Not on file    Outpatient  Encounter Medications as of 03/10/2018  Medication Sig Note  . ALPRAZolam (XANAX) 0.5 MG tablet Take 1 tablet (0.5 mg total) by mouth at bedtime. (Patient taking differently: Take 0.5 mg by mouth as needed. )   . Calcium-Magnesium-Vitamin D (CALCIUM 500 PO) Take by mouth. am 11/26/2014: Received from: [facility]  . cetirizine (ZYRTEC) 10 MG tablet Take by mouth as needed.  11/26/2014: Received from: [facility]  . hydrochlorothiazide (HYDRODIURIL) 25 MG tablet Take 1 tablet (25 mg total) by mouth daily.   Marland Kitchen levothyroxine (SYNTHROID, LEVOTHROID) 75 MCG tablet Take 1 tablet (75 mcg total) by mouth daily.   . Multiple Vitamins-Minerals (MULTIVITAMIN ADULT PO) Take by mouth. am 11/26/2014: Received from: [facility]  . pantoprazole (PROTONIX) 40 MG tablet  TAKE 1 TABLET TWICE DAILY   . simvastatin (ZOCOR) 20 MG tablet TAKE 1 TABLET AT BEDTIME    No facility-administered encounter medications on file as of 03/10/2018.     Allergies  Allergen Reactions  . Sulfa Antibiotics Itching    Review of Systems  Constitutional: Negative for fever and malaise/fatigue.  HENT: Negative.   Eyes: Negative.   Respiratory: Positive for cough and shortness of breath (only after coughing spells). Negative for wheezing.   Cardiovascular: Positive for leg swelling (left foot). Negative for chest pain, palpitations, orthopnea and claudication.  Gastrointestinal: Negative.   Skin: Negative.   Neurological: Negative.   Endo/Heme/Allergies: Negative.   Psychiatric/Behavioral: Negative.     Objective:  BP (!) 158/80 (BP Location: Right Arm, Patient Position: Sitting, Cuff Size: Normal)   Pulse 70   Temp 97.6 F (36.4 C) (Oral)   Resp 16   Wt 165 lb (74.8 kg)   LMP  (LMP Unknown)   SpO2 96%   BMI 30.18 kg/m   Physical Exam  Constitutional: She is oriented to person, place, and time and well-developed, well-nourished, and in no distress.  HENT:  Head: Normocephalic and atraumatic.  Right Ear: External ear normal.  Left Ear: External ear normal.  Nose: Nose normal.  Eyes: Conjunctivae are normal. No scleral icterus.  Neck: No thyromegaly present.  Cardiovascular: Normal rate, regular rhythm, normal heart sounds and intact distal pulses.  Pulmonary/Chest: Effort normal and breath sounds normal.  Abdominal: Soft.  Musculoskeletal: She exhibits no edema.  Neurological: She is alert and oriented to person, place, and time. Gait normal. GCS score is 15.  Skin: Skin is warm and dry.  Psychiatric: Mood, memory, affect and judgment normal.    Assessment and Plan :  1. Adult hypothyroidism   2. Essential (primary) hypertension Add Losartan 50mg  daily--RTC 1 month.  I have done the exam and reviewed the chart and it is accurate to the best of my  knowledge. Development worker, community has been used and  any errors in dictation or transcription are unintentional. Miguel Aschoff M.D. [facility]

## 2018-04-02 DIAGNOSIS — H26492 Other secondary cataract, left eye: Secondary | ICD-10-CM | POA: Diagnosis not present

## 2018-04-03 ENCOUNTER — Ambulatory Visit (INDEPENDENT_AMBULATORY_CARE_PROVIDER_SITE_OTHER): Payer: Medicare HMO | Admitting: Family Medicine

## 2018-04-03 VITALS — BP 136/76 | HR 65 | Temp 98.0°F | Resp 16 | Wt 162.0 lb

## 2018-04-03 DIAGNOSIS — Z23 Encounter for immunization: Secondary | ICD-10-CM

## 2018-04-03 DIAGNOSIS — R109 Unspecified abdominal pain: Secondary | ICD-10-CM

## 2018-04-03 DIAGNOSIS — K5792 Diverticulitis of intestine, part unspecified, without perforation or abscess without bleeding: Secondary | ICD-10-CM | POA: Diagnosis not present

## 2018-04-03 LAB — POCT URINALYSIS DIPSTICK
Appearance: NORMAL
Bilirubin, UA: NEGATIVE
Blood, UA: NEGATIVE
Glucose, UA: NEGATIVE
KETONES UA: NEGATIVE
NITRITE UA: NEGATIVE
ODOR: NORMAL
PH UA: 8 (ref 5.0–8.0)
PROTEIN UA: NEGATIVE
Spec Grav, UA: <=1.005 — AB (ref 1.010–1.025)
UROBILINOGEN UA: 0.2 U/dL

## 2018-04-03 MED ORDER — DOXYCYCLINE HYCLATE 100 MG PO CAPS: 100.0000 mg | ORAL_CAPSULE | Freq: Two times a day (BID) | ORAL | 0 refills | Status: DC

## 2018-04-03 MED ORDER — ALPRAZOLAM 0.5 MG PO TABS: 0.5000 mg | ORAL_TABLET | Freq: Every evening | ORAL | 1 refills | Status: DC | PRN

## 2018-04-03 NOTE — Patient Instructions (Signed)
UTI POCT UA Urine Culture ordered Doxycycline 100 mg bid x7 days

## 2018-04-03 NOTE — Progress Notes (Signed)
Lynn Williams  MRN: [id number] DOB: Oct 20, 1931  Subjective:  HPI   The patient is an 84 year old female who presents for evaluation of abdominal pain.  She states that her symptoms started about 1 week ago.  She has also had diarrhea and bloating.  No blood in stool. She denies any fever, nausea or vomiting.   She states the pain is mostly in the left lower quadrant and goes through to her back.  She denies any urinary symptoms but does state that she has history of diverticulosis.  Patient has also asked to have a refill on Alprazolam.  Patient Active Problem List   Diagnosis Date Noted  . Abdominal pain, epigastric   . Gastritis   . Hiatal hernia   . Cervical stenosis (uterine cervix) 01/12/2016  . Abnormal ultrasound of endometrium 01/12/2016  . Abnormal collection of fluid in uterine cavity 01/12/2016  . Pelvic pain in female 01/12/2016  . Medicare annual wellness visit, subsequent 01/25/2015  . Allergic rhinitis 11/26/2014  . Basal cell carcinoma of nose 11/26/2014  . DD (diverticular disease) 11/26/2014  . Bloodgood disease 11/26/2014  . Essential (primary) hypertension 11/26/2014  . Hypercholesteremia 11/26/2014  . Adult hypothyroidism 11/26/2014  . Cannot sleep 11/26/2014  . Arthritis, degenerative 11/26/2014    Past Medical History:  Diagnosis Date  . Arthritis    hands and wrist and knees  . Cough    chronic/allergies  . GERD (gastroesophageal reflux disease)   . Hyperlipidemia   . Hypertension    controlled on meds  . Hypothyroidism   . Wears partial dentures    upper    Social History   Socioeconomic History  . Marital status: Married    Spouse name: Not on file  . Number of children: 0  . Years of education: Not on file  . Highest education level: GED or equivalent  Occupational History  . Occupation: retired  Scientific laboratory technician  . Financial resource strain: Not hard at all  . Food insecurity:    Worry: Never true    Inability: Never true    . Transportation needs:    Medical: No    Non-medical: No  Tobacco Use  . Smoking status: Never Smoker  . Smokeless tobacco: Never Used  Substance and Sexual Activity  . Alcohol use: No    Alcohol/week: 0.0 standard drinks  . Drug use: No  . Sexual activity: Not on file  Lifestyle  . Physical activity:    Days per week: Not on file    Minutes per session: Not on file  . Stress: Only a little  Relationships  . Social connections:    Talks on phone: Not on file    Gets together: Not on file    Attends religious service: Not on file    Active member of club or organization: Not on file    Attends meetings of clubs or organizations: Not on file    Relationship status: Not on file  . Intimate partner violence:    Fear of current or ex partner: Not on file    Emotionally abused: Not on file    Physically abused: Not on file    Forced sexual activity: Not on file  Other Topics Concern  . Not on file  Social History Narrative  . Not on file    Outpatient Encounter Medications as of 04/03/2018  Medication Sig Note  . ALPRAZolam (XANAX) 0.5 MG tablet Take 1 tablet (0.5 mg total) by mouth  at bedtime. (Patient taking differently: Take 0.5 mg by mouth as needed. )   . Calcium-Magnesium-Vitamin D (CALCIUM 500 PO) Take by mouth. am 11/26/2014: Received from: [facility]  . cetirizine (ZYRTEC) 10 MG tablet Take by mouth as needed.  11/26/2014: Received from: [facility]  . hydrochlorothiazide (HYDRODIURIL) 25 MG tablet Take 1 tablet (25 mg total) by mouth daily.   Marland Kitchen levothyroxine (SYNTHROID, LEVOTHROID) 75 MCG tablet Take 1 tablet (75 mcg total) by mouth daily.   Marland Kitchen losartan (COZAAR) 50 MG tablet Take 1 tablet (50 mg total) by mouth daily.   . Multiple Vitamins-Minerals (MULTIVITAMIN ADULT PO) Take by mouth. am 11/26/2014: Received from: [facility]  . pantoprazole (PROTONIX) 40 MG tablet TAKE 1 TABLET TWICE DAILY   . simvastatin (ZOCOR) 20  MG tablet TAKE 1 TABLET AT BEDTIME    No facility-administered encounter medications on file as of 04/03/2018.     Allergies  Allergen Reactions  . Sulfa Antibiotics Itching    Review of Systems  Constitutional: Positive for malaise/fatigue. Negative for fever.  Eyes: Negative.   Respiratory: Negative for cough, shortness of breath and wheezing.   Cardiovascular: Negative for chest pain, palpitations and orthopnea.  Gastrointestinal: Positive for abdominal pain, blood in stool (once but she has history of hemorrhoids) and diarrhea. Negative for constipation, heartburn, nausea and vomiting.  Genitourinary: Negative for dysuria, frequency and urgency.  Skin: Negative.   Neurological: Negative.   Endo/Heme/Allergies: Negative.   Psychiatric/Behavioral: Negative.     Objective:  BP 136/76 (BP Location: Right Arm, Patient Position: Sitting, Cuff Size: Normal)   Pulse 65   Temp 98 F (36.7 C) (Oral)   Resp 16   Wt 162 lb (73.5 kg)   LMP  (LMP Unknown)   BMI 29.63 kg/m   Physical Exam  Constitutional: She is oriented to person, place, and time and well-developed, well-nourished, and in no distress.  NAD.  HENT:  Head: Normocephalic and atraumatic.  Right Ear: External ear normal.  Left Ear: External ear normal.  Nose: Nose normal.  Eyes: Conjunctivae are normal.  Neck: No thyromegaly present.  Cardiovascular: Normal rate, regular rhythm and normal heart sounds.  Pulmonary/Chest: Effort normal and breath sounds normal.  Abdominal: Soft. There is tenderness.  Mild tenderness of LLQ without rebound.,  Musculoskeletal: She exhibits no edema.  Neurological: She is alert and oriented to person, place, and time. Gait normal. GCS score is 15.  Skin: Skin is warm and dry.  Psychiatric: Mood, memory, affect and judgment normal.    Assessment and Plan :   1. Need for influenza vaccination  - Flu vaccine HIGH DOSE PF (Fluzone High dose)  2. Flank pain  - POCT urinalysis  dipstick - CULTURE, URINE COMPREHENSIVE  3. Diverticulitis Doxycycline. RTC 1 week.  I have done the exam and reviewed the chart and it is accurate to the best of my knowledge. Development worker, community has been used and  any errors in dictation or transcription are unintentional. Miguel Aschoff M.D. [facility]

## 2018-04-07 ENCOUNTER — Telehealth: Payer: Self-pay | Admitting: Family Medicine

## 2018-04-07 NOTE — Telephone Encounter (Signed)
Pt stated she was advised to call back this week to let Dr. Rosanna Randy know how she is feeling since starting doxycycline (VIBRAMYCIN) 100 MG capsule. Pt stated that she is feeling much better. Thanks TNP

## 2018-04-07 NOTE — Telephone Encounter (Signed)
Please review. Thanks!  

## 2018-04-08 LAB — CULTURE, URINE COMPREHENSIVE

## 2018-04-18 DIAGNOSIS — H26491 Other secondary cataract, right eye: Secondary | ICD-10-CM | POA: Diagnosis not present

## 2018-06-09 ENCOUNTER — Encounter: Payer: Self-pay | Admitting: Family Medicine

## 2018-06-09 ENCOUNTER — Ambulatory Visit (INDEPENDENT_AMBULATORY_CARE_PROVIDER_SITE_OTHER): Payer: Medicare HMO | Admitting: Family Medicine

## 2018-06-09 VITALS — BP 132/78 | HR 70 | Temp 98.0°F | Resp 16 | Ht 62.0 in | Wt 162.0 lb

## 2018-06-09 DIAGNOSIS — E039 Hypothyroidism, unspecified: Secondary | ICD-10-CM

## 2018-06-09 DIAGNOSIS — I10 Essential (primary) hypertension: Secondary | ICD-10-CM

## 2018-06-09 MED ORDER — LOSARTAN POTASSIUM 50 MG PO TABS: 50.0000 mg | ORAL_TABLET | Freq: Every day | ORAL | 3 refills | Status: DC

## 2018-06-09 NOTE — Progress Notes (Signed)
Patient: Lynn Williams Female    DOB: 1932-03-26   84 y.o.   MRN: [id number] Visit Date: 06/09/2018  Today's Provider: [name], MD   Chief Complaint  Patient presents with  . Hypertension  . Hyperlipidemia  . Hypothyroidism   Subjective:    HPI  Hypertension, follow-up:  BP Readings from Last 3 Encounters:  06/09/18 132/78  04/03/18 136/76  03/10/18 (!) 158/80    She was last seen for hypertension 3 months ago.  BP at that visit was 158/80. Management since that visit includes adding losartan 50mg  daily. She reports good compliance with treatment. She is not having side effects.  She is exercising. She is adherent to low salt diet.   Outside blood pressures are checked occasionally. She is experiencing none.  Patient denies exertional chest pressure/discomfort, lower extremity edema and palpitations.   Cardiovascular risk factors include dyslipidemia.  Use of agents associated with hypertension: none.    Weight trend: stable Wt Readings from Last 3 Encounters:  06/09/18 162 lb (73.5 kg)  04/03/18 162 lb (73.5 kg)  03/10/18 165 lb (74.8 kg)    Current diet: well balanced    Hypothyroidism, follow up: Patient was last seen in the office 3 months ago. Since then synthroid was increased from 32mcg to 51mcg daily. Patient reports good compliance and good symptom control. Lab Results  Component Value Date   TSH 6.810 (H) 12/05/2017     Allergies  Allergen Reactions  . Sulfa Antibiotics Itching     Current Outpatient Medications:  .  ALPRAZolam (XANAX) 0.5 MG tablet, Take 1 tablet (0.5 mg total) by mouth at bedtime as needed for anxiety., Disp: 90 tablet, Rfl: 1 .  Calcium-Magnesium-Vitamin D (CALCIUM 500 PO), Take by mouth. am, Disp: , Rfl:  .  cetirizine (ZYRTEC) 10 MG tablet, Take by mouth as needed. , Disp: , Rfl:  .  hydrochlorothiazide (HYDRODIURIL) 25 MG tablet, Take 1 tablet (25 mg total) by mouth daily., Disp: 90 tablet, Rfl:  3 .  levothyroxine (SYNTHROID, LEVOTHROID) 75 MCG tablet, Take 1 tablet (75 mcg total) by mouth daily., Disp: 90 tablet, Rfl: 3 .  losartan (COZAAR) 50 MG tablet, Take 1 tablet (50 mg total) by mouth daily., Disp: 30 tablet, Rfl: 3 .  Multiple Vitamins-Minerals (MULTIVITAMIN ADULT PO), Take by mouth. am, Disp: , Rfl:  .  pantoprazole (PROTONIX) 40 MG tablet, TAKE 1 TABLET TWICE DAILY, Disp: 180 tablet, Rfl: 3 .  simvastatin (ZOCOR) 20 MG tablet, TAKE 1 TABLET AT BEDTIME, Disp: 90 tablet, Rfl: 3 .  doxycycline (VIBRAMYCIN) 100 MG capsule, Take 1 capsule (100 mg total) by mouth 2 (two) times daily. (Patient not taking: Reported on 06/09/2018), Disp: 14 capsule, Rfl: 0  Review of Systems  Constitutional: Negative.   Respiratory: Negative.   Cardiovascular: Negative.   Endocrine: Negative.   Musculoskeletal: Negative.   Neurological: Negative.   Psychiatric/Behavioral: Negative.     Social History   Tobacco Use  . Smoking status: Never Smoker  . Smokeless tobacco: Never Used  Substance Use Topics  . Alcohol use: No    Alcohol/week: 0.0 standard drinks   Objective:   BP 132/78 (BP Location: Left Arm, Patient Position: Sitting, Cuff Size: Normal)   Pulse 70   Temp 98 F (36.7 C)   Resp 16   Ht 5\' 2"  (1.575 m)   Wt 162 lb (73.5 kg)   LMP  (LMP Unknown)   SpO2 97%   BMI  29.63 kg/m  Vitals:   06/09/18 0928  BP: 132/78  Pulse: 70  Resp: 16  Temp: 98 F (36.7 C)  SpO2: 97%  Weight: 162 lb (73.5 kg)  Height: 5\' 2"  (1.575 m)     Physical Exam  Constitutional: She is oriented to person, place, and time. She appears well-developed and well-nourished.  HENT:  Head: Normocephalic and atraumatic.  Right Ear: External ear normal.  Left Ear: External ear normal.  Nose: Nose normal.  Mouth/Throat: Oropharynx is clear and moist.  Eyes: Conjunctivae are normal.  Cardiovascular: Normal rate, regular rhythm, normal heart sounds and intact distal pulses.  Pulmonary/Chest: Effort  normal and breath sounds normal.  Abdominal: Soft.  Musculoskeletal: She exhibits no edema.  Neurological: She is alert and oriented to person, place, and time.  Skin: Skin is warm and dry.  Psychiatric: She has a normal mood and affect. Her behavior is normal. Judgment and thought content normal.        Assessment & Plan:     1. Adult hypothyroidism  - TSH  2. Essential (primary) hypertension Stable.RTC 6 months. - losartan (COZAAR) 50 MG tablet; Take 1 tablet (50 mg total) by mouth daily.  Dispense: 90 tablet; Refill: 3      I have done the exam and reviewed the above chart and it is accurate to the best of my knowledge. Development worker, community has been used in this note in any air is in the dictation or transcription are unintentional.  [name], MD  [place]

## 2018-06-10 LAB — TSH: TSH: 0.166 u[IU]/mL — AB (ref 0.450–4.500)

## 2018-06-11 ENCOUNTER — Telehealth: Payer: Self-pay | Admitting: *Deleted

## 2018-06-11 MED ORDER — LEVOTHYROXINE SODIUM 50 MCG PO TABS: 50.0000 ug | ORAL_TABLET | Freq: Every day | ORAL | 1 refills | Status: DC

## 2018-06-11 NOTE — Telephone Encounter (Signed)
Patient was notified of results. Expressed understanding. Rx sent to pharmacy. 

## 2018-06-11 NOTE — Telephone Encounter (Signed)
-----   Message from Jerrol Banana., MD sent at 06/11/2018 12:58 PM EST ----- Thyroid overtreated.  Change from 75 to 50 mcg daily.  Will recheck on next visit.

## 2018-07-17 ENCOUNTER — Other Ambulatory Visit: Payer: Self-pay | Admitting: Family Medicine

## 2018-07-17 DIAGNOSIS — I10 Essential (primary) hypertension: Secondary | ICD-10-CM

## 2018-08-25 ENCOUNTER — Other Ambulatory Visit: Payer: Self-pay | Admitting: Family Medicine

## 2018-12-10 ENCOUNTER — Ambulatory Visit: Payer: Self-pay | Admitting: Family Medicine

## 2018-12-10 ENCOUNTER — Encounter: Payer: Self-pay | Admitting: Family Medicine

## 2018-12-10 ENCOUNTER — Ambulatory Visit (INDEPENDENT_AMBULATORY_CARE_PROVIDER_SITE_OTHER): Payer: Medicare HMO

## 2018-12-10 ENCOUNTER — Other Ambulatory Visit: Payer: Self-pay

## 2018-12-10 DIAGNOSIS — Z0000 Encounter for general adult medical examination without abnormal findings: Secondary | ICD-10-CM | POA: Diagnosis not present

## 2018-12-10 DIAGNOSIS — Z13820 Encounter for screening for osteoporosis: Secondary | ICD-10-CM | POA: Diagnosis not present

## 2018-12-10 NOTE — Patient Instructions (Signed)
Lynn Williams , Thank you for taking time to come for your Medicare Wellness Visit. I appreciate your ongoing commitment to your health goals. Please review the following plan we discussed and let me know if I can assist you in the future.   Screening recommendations/referrals: Colonoscopy: No longer required.  Mammogram: No longer required.  Bone Density: Ordered today. Pt provided with contact info and advised to call to schedule appt. Pt aware the office will call re: appt. Recommended yearly ophthalmology/optometry visit for glaucoma screening and checkup Recommended yearly dental visit for hygiene and checkup  Vaccinations: Influenza vaccine: Up to date Pneumococcal vaccine: Completed series Tdap vaccine: Up to date, due 12/2023 Shingles vaccine: Pt declines today.     Advanced directives: Currently on file.   Conditions/risks identified: Continue to increase water intake, decrease sugars in diet and start walking around the home.   Next appointment: 02/09/19 @ 8:20 AM with Dr Rosanna Randy. Declined scheduling an AWV for 2021 at this time.    Preventive Care 32 Years and Older, Female Preventive care refers to lifestyle choices and visits with your health care provider that can promote health and wellness. What does preventive care include?  A yearly physical exam. This is also called an annual well check.  Dental exams once or twice a year.  Routine eye exams. Ask your health care provider how often you should have your eyes checked.  Personal lifestyle choices, including:  Daily care of your teeth and gums.  Regular physical activity.  Eating a healthy diet.  Avoiding tobacco and drug use.  Limiting alcohol use.  Practicing safe sex.  Taking low-dose aspirin every day.  Taking vitamin and mineral supplements as recommended by your health care provider. What happens during an annual well check? The services and screenings done by your health care provider during  your annual well check will depend on your age, overall health, lifestyle risk factors, and family history of disease. Counseling  Your health care provider may ask you questions about your:  Alcohol use.  Tobacco use.  Drug use.  Emotional well-being.  Home and relationship well-being.  Sexual activity.  Eating habits.  History of falls.  Memory and ability to understand (cognition).  Work and work Statistician.  Reproductive health. Screening  You may have the following tests or measurements:  Height, weight, and BMI.  Blood pressure.  Lipid and cholesterol levels. These may be checked every 5 years, or more frequently if you are over 30 years old.  Skin check.  Lung cancer screening. You may have this screening every year starting at age 106 if you have a 30-pack-year history of smoking and currently smoke or have quit within the past 15 years.  Fecal occult blood test (FOBT) of the stool. You may have this test every year starting at age 47.  Flexible sigmoidoscopy or colonoscopy. You may have a sigmoidoscopy every 5 years or a colonoscopy every 10 years starting at age 57.  Hepatitis C blood test.  Hepatitis B blood test.  Sexually transmitted disease (STD) testing.  Diabetes screening. This is done by checking your blood sugar (glucose) after you have not eaten for a while (fasting). You may have this done every 1-3 years.  Bone density scan. This is done to screen for osteoporosis. You may have this done starting at age 54.  Mammogram. This may be done every 1-2 years. Talk to your health care provider about how often you should have regular mammograms. Talk with your health  care provider about your test results, treatment options, and if necessary, the need for more tests. Vaccines  Your health care provider may recommend certain vaccines, such as:  Influenza vaccine. This is recommended every year.  Tetanus, diphtheria, and acellular pertussis (Tdap,  Td) vaccine. You may need a Td booster every 10 years.  Zoster vaccine. You may need this after age 58.  Pneumococcal 13-valent conjugate (PCV13) vaccine. One dose is recommended after age 71.  Pneumococcal polysaccharide (PPSV23) vaccine. One dose is recommended after age 49. Talk to your health care provider about which screenings and vaccines you need and how often you need them. This information is not intended to replace advice given to you by your health care provider. Make sure you discuss any questions you have with your health care provider. Document Released: 08/05/2015 Document Revised: 03/28/2016 Document Reviewed: 05/10/2015 Elsevier Interactive Patient Education  2017 [place] Prevention in the Home Falls can cause injuries. They can happen to people of all ages. There are many things you can do to make your home safe and to help prevent falls. What can I do on the outside of my home?  Regularly fix the edges of walkways and driveways and fix any cracks.  Remove anything that might make you trip as you walk through a door, such as a raised step or threshold.  Trim any bushes or trees on the path to your home.  Use bright outdoor lighting.  Clear any walking paths of anything that might make someone trip, such as rocks or tools.  Regularly check to see if handrails are loose or broken. Make sure that both sides of any steps have handrails.  Any raised decks and porches should have guardrails on the edges.  Have any leaves, snow, or ice cleared regularly.  Use sand or salt on walking paths during winter.  Clean up any spills in your garage right away. This includes oil or grease spills. What can I do in the bathroom?  Use night lights.  Install grab bars by the toilet and in the tub and shower. Do not use towel bars as grab bars.  Use non-skid mats or decals in the tub or shower.  If you need to sit down in the shower, use a plastic, non-slip stool.   Keep the floor dry. Clean up any water that spills on the floor as soon as it happens.  Remove soap buildup in the tub or shower regularly.  Attach bath mats securely with double-sided non-slip rug tape.  Do not have throw rugs and other things on the floor that can make you trip. What can I do in the bedroom?  Use night lights.  Make sure that you have a light by your bed that is easy to reach.  Do not use any sheets or blankets that are too big for your bed. They should not hang down onto the floor.  Have a firm chair that has side arms. You can use this for support while you get dressed.  Do not have throw rugs and other things on the floor that can make you trip. What can I do in the kitchen?  Clean up any spills right away.  Avoid walking on wet floors.  Keep items that you use a lot in easy-to-reach places.  If you need to reach something above you, use a strong step stool that has a grab bar.  Keep electrical cords out of the way.  Do not use floor  polish or wax that makes floors slippery. If you must use wax, use non-skid floor wax.  Do not have throw rugs and other things on the floor that can make you trip. What can I do with my stairs?  Do not leave any items on the stairs.  Make sure that there are handrails on both sides of the stairs and use them. Fix handrails that are broken or loose. Make sure that handrails are as long as the stairways.  Check any carpeting to make sure that it is firmly attached to the stairs. Fix any carpet that is loose or worn.  Avoid having throw rugs at the top or bottom of the stairs. If you do have throw rugs, attach them to the floor with carpet tape.  Make sure that you have a light switch at the top of the stairs and the bottom of the stairs. If you do not have them, ask someone to add them for you. What else can I do to help prevent falls?  Wear shoes that:  Do not have high heels.  Have rubber bottoms.  Are  comfortable and fit you well.  Are closed at the toe. Do not wear sandals.  If you use a stepladder:  Make sure that it is fully opened. Do not climb a closed stepladder.  Make sure that both sides of the stepladder are locked into place.  Ask someone to hold it for you, if possible.  Clearly mark and make sure that you can see:  Any grab bars or handrails.  First and last steps.  Where the edge of each step is.  Use tools that help you move around (mobility aids) if they are needed. These include:  Canes.  Walkers.  Scooters.  Crutches.  Turn on the lights when you go into a dark area. Replace any light bulbs as soon as they burn out.  Set up your furniture so you have a clear path. Avoid moving your furniture around.  If any of your floors are uneven, fix them.  If there are any pets around you, be aware of where they are.  Review your medicines with your doctor. Some medicines can make you feel dizzy. This can increase your chance of falling. Ask your doctor what other things that you can do to help prevent falls. This information is not intended to replace advice given to you by your health care provider. Make sure you discuss any questions you have with your health care provider. Document Released: 05/05/2009 Document Revised: 12/15/2015 Document Reviewed: 08/13/2014 Elsevier Interactive Patient Education  2017 [employer].

## 2018-12-10 NOTE — Progress Notes (Signed)
Subjective:   Lynn Williams is a 85 y.o. female who presents for Medicare Annual (Subsequent) preventive examination.    This visit is being conducted through telemedicine due to the COVID-19 pandemic. This patient has given me verbal consent via doximity to conduct this visit, patient states they are participating from their home address. Some vital signs may be absent or patient reported.    Patient identification: identified by name, DOB, and current address  Review of Systems:  N/A  Cardiac Risk Factors include: advanced age (>58men, >51 women);dyslipidemia;hypertension     Objective:     Vitals: LMP  (LMP Unknown)   There is no height or weight on file to calculate BMI. Unable to obtain vitals due to visit being conducted via telephonically.    Advanced Directives 12/10/2018 12/03/2017 08/28/2016 01/16/2016 12/22/2015 11/28/2015 01/25/2015  Does Patient Have a Medical Advance Directive? Yes Yes Yes Yes Yes Yes Yes  Type of Paramedic of [place];Living will [place];Living will Living will;Healthcare Power of [place];Living will [place];Living will - Living will  Does patient want to make changes to medical advance directive? - - - No - Patient declined - - -  Copy of [place] in Chart? Yes - validated most recent copy scanned in chart (See row information) No - copy requested No - copy requested No - copy requested - - -    Tobacco Social History   Tobacco Use  Smoking Status Never Smoker  Smokeless Tobacco Never Used     Counseling given: Not Answered   Clinical Intake:  Pre-visit preparation completed: Yes  Pain : No/denies pain Pain Score: 0-No pain     Nutritional Status: BMI 25 -29 Overweight Nutritional Risks: None Diabetes: No  How often do you need to have someone help you when you read instructions, pamphlets, or other written materials from  your doctor or pharmacy?: 1 - Never  Interpreter Needed?: No  Information entered by :: MMarkoski, LPN  Past Medical History:  Diagnosis Date  . Arthritis    hands and wrist and knees  . Cough    chronic/allergies  . GERD (gastroesophageal reflux disease)   . Hyperlipidemia   . Hypertension    controlled on meds  . Hypothyroidism   . Wears partial dentures    upper   Past Surgical History:  Procedure Laterality Date  . BREAST BIOPSY Right 1978   benign  . BREAST CYST ASPIRATION    . BREAST LUMPECTOMY Right   . CATARACT EXTRACTION Bilateral   . ESOPHAGOGASTRODUODENOSCOPY (EGD) WITH PROPOFOL N/A 01/16/2016   Procedure: ESOPHAGOGASTRODUODENOSCOPY (EGD) WITH PROPOFOL;  Surgeon: Lucilla Lame, MD;  Location: [facility];  Service: Endoscopy;  Laterality: N/A;  . TUBAL LIGATION     Family History  Problem Relation Age of Onset  . Hypertension Mother   . Heart attack Mother   . Stroke Mother   . Prostate cancer Father   . Heart attack Brother   . Stroke Sister   . Heart attack Sister   . Coronary artery disease Sister   . Alzheimer's disease Sister   . Heart attack Brother   . COPD Brother   . Heart attack Sister   . Coronary artery disease Sister   . Diabetes Sister   . Hypertension Sister   . Heart disease Sister   . Heart disease Brother   . Heart disease Brother   . COPD Brother   .  Breast cancer Neg Hx    Social History   Socioeconomic History  . Marital status: Married    Spouse name: Not on file  . Number of children: 0  . Years of education: Not on file  . Highest education level: GED or equivalent  Occupational History  . Occupation: retired  Scientific laboratory technician  . Financial resource strain: Not hard at all  . Food insecurity:    Worry: Never true    Inability: Never true  . Transportation needs:    Medical: No    Non-medical: No  Tobacco Use  . Smoking status: Never Smoker  . Smokeless tobacco: Never Used  Substance and Sexual Activity  .  Alcohol use: No    Alcohol/week: 0.0 standard drinks  . Drug use: No  . Sexual activity: Not on file  Lifestyle  . Physical activity:    Days per week: 0 days    Minutes per session: 0 min  . Stress: Only a little  Relationships  . Social connections:    Talks on phone: Patient refused    Gets together: Patient refused    Attends religious service: Patient refused    Active member of club or organization: Patient refused    Attends meetings of clubs or organizations: Patient refused    Relationship status: Patient refused  Other Topics Concern  . Not on file  Social History Narrative  . Not on file    Outpatient Encounter Medications as of 12/10/2018  Medication Sig  . ALPRAZolam (XANAX) 0.5 MG tablet Take 1 tablet (0.5 mg total) by mouth at bedtime as needed for anxiety.  . Calcium-Magnesium-Vitamin D (CALCIUM 500 PO) Take by mouth. am  . cetirizine (ZYRTEC) 10 MG tablet Take by mouth as needed.   . hydrochlorothiazide (HYDRODIURIL) 25 MG tablet TAKE 1 TABLET (25 MG TOTAL) BY MOUTH DAILY.  Marland Kitchen levothyroxine (SYNTHROID, LEVOTHROID) 50 MCG tablet Take 1 tablet (50 mcg total) by mouth daily.  Marland Kitchen losartan (COZAAR) 50 MG tablet Take 1 tablet (50 mg total) by mouth daily.  . Multiple Vitamins-Minerals (MULTIVITAMIN ADULT PO) Take by mouth. am  . pantoprazole (PROTONIX) 40 MG tablet TAKE 1 TABLET TWICE DAILY (Patient taking differently: Take 40 mg by mouth daily. )  . simvastatin (ZOCOR) 20 MG tablet TAKE 1 TABLET AT BEDTIME  . doxycycline (VIBRAMYCIN) 100 MG capsule Take 1 capsule (100 mg total) by mouth 2 (two) times daily. (Patient not taking: Reported on 06/09/2018)   No facility-administered encounter medications on file as of 12/10/2018.     Activities of Daily Living In your present state of health, do you have any difficulty performing the following activities: 12/10/2018  Hearing? N  Vision? N  Comment Wears eye glasses daily.   Difficulty concentrating or making decisions? N   Walking or climbing stairs? N  Dressing or bathing? N  Doing errands, shopping? N  Preparing Food and eating ? N  Using the Toilet? N  In the past six months, have you accidently leaked urine? N  Do you have problems with loss of bowel control? Y  Comment Occasionally, wears protection at all times. Unsure of cause.   Managing your Medications? N  Managing your Finances? N  Housekeeping or managing your Housekeeping? N  Some recent data might be hidden    Patient Care Team: Jerrol Banana., MD as PCP - General (Family Medicine) Birder Robson, MD as Referring Physician (Ophthalmology)    Assessment:   This is a routine wellness  examination for Lynn Williams.  Exercise Activities and Dietary recommendations Current Exercise Habits: The patient does not participate in regular exercise at present, Exercise limited by: Other - see comments(Unable to get to exercise class due to closure from Covid-19.)  Goals    . DIET - REDUCE SUGAR INTAKE     Recommend cutting out all sugar in diet to help aid in weight loss and decrease BMI.    . Increase water intake     Starting 08/28/16, I will increase my water intake to 5 glasses a day.       Fall Risk: Fall Risk  12/10/2018 12/03/2017 08/28/2016 08/25/2015 01/25/2015  Falls in the past year? 0 No No No No    FALL RISK PREVENTION PERTAINING TO THE HOME:  Any stairs in or around the home? Yes  If so, are there any without handrails? Yes   Home free of loose throw rugs in walkways, pet beds, electrical cords, etc? Yes  Adequate lighting in your home to reduce risk of falls? Yes   ASSISTIVE DEVICES UTILIZED TO PREVENT FALLS:  Life alert? No  Use of a cane, walker or w/c? No  Grab bars in the bathroom? No  Shower chair or bench in shower? No  Elevated toilet seat or a handicapped toilet? No    TIMED UP AND GO:  Was the test performed? No .    Depression Screen PHQ 2/9 Scores 12/10/2018 12/10/2018 12/03/2017 08/28/2016  PHQ - 2 Score  0 0 0 0  PHQ- 9 Score 2 - - -     Cognitive Function     6CIT Screen 12/10/2018 08/28/2016  What Year? 0 points 0 points  What month? 0 points 0 points  What time? 0 points 0 points  Count back from 20 0 points 0 points  Months in reverse 0 points 2 points  Repeat phrase 0 points 0 points  Total Score 0 2    Immunization History  Administered Date(s) Administered  . Influenza, High Dose Seasonal PF 05/04/2015, 05/14/2016, 05/08/2017, 04/03/2018  . Pneumococcal Conjugate-13 12/23/2013  . Pneumococcal Polysaccharide-23 07/24/1996, 08/04/2003    Qualifies for Shingles Vaccine? Yes . Due for Shingrix. Education has been provided regarding the importance of this vaccine. Pt has been advised to call insurance company to determine out of pocket expense. Advised may also receive vaccine at local pharmacy or Health Dept. Verbalized acceptance and understanding.  Tdap: Up to date  Flu Vaccine: Up to date  Pneumococcal Vaccine: Up to date  Screening Tests Health Maintenance  Topic Date Due  . DEXA SCAN  12/24/2014  . INFLUENZA VACCINE  02/21/2019  . TETANUS/TDAP  12/24/2023  . PNA vac Low Risk Adult  Completed    Cancer Screenings:  Colorectal Screening: No longer required.   Mammogram: No longer required.   Bone Density: Completed 12/23/12. Results reflect OSTEOPOROSIS. Repeat every 2 years. Ordered today. Pt provided with contact info and advised to call to schedule appt. Pt aware the office will call re: appt.  Lung Cancer Screening: (Low Dose CT Chest recommended if Age 51-80 years, 30 pack-year currently smoking OR have quit w/in 15years.) does not qualify.   Additional Screening:  Vision Screening: Recommended annual ophthalmology exams for early detection of glaucoma and other disorders of the eye.  Dental Screening: Recommended annual dental exams for proper oral hygiene  Community Resource Referral:  CRR required this visit?  No       Plan:  I have personally  reviewed and addressed  the Medicare Annual Wellness questionnaire and have noted the following in the patient's chart:  A. Medical and social history B. Use of alcohol, tobacco or illicit drugs  C. Current medications and supplements D. Functional ability and status E.  Nutritional status F.  Physical activity G. Advance directives H. List of other physicians I.  Hospitalizations, surgeries, and ER visits in previous 12 months J.  [place] such as hearing and vision if needed, cognitive and depression L. Referrals and appointments   In addition, I have reviewed and discussed with patient certain preventive protocols, quality metrics, and best practice recommendations. A written personalized care plan for preventive services as well as general preventive health recommendations were provided to patient. Nurse Health Advisor  Signed,    Montreal Steidle [place], [place]  [id number] Nurse Health Advisor   Nurse Notes: None.

## 2019-01-02 ENCOUNTER — Other Ambulatory Visit: Payer: Self-pay | Admitting: Family Medicine

## 2019-01-02 NOTE — Telephone Encounter (Signed)
Please review

## 2019-01-22 ENCOUNTER — Other Ambulatory Visit: Payer: Commercial Managed Care - HMO

## 2019-02-09 ENCOUNTER — Ambulatory Visit (INDEPENDENT_AMBULATORY_CARE_PROVIDER_SITE_OTHER): Payer: Medicare HMO | Admitting: Family Medicine

## 2019-02-09 ENCOUNTER — Encounter: Payer: Self-pay | Admitting: Family Medicine

## 2019-02-09 ENCOUNTER — Other Ambulatory Visit: Payer: Self-pay

## 2019-02-09 VITALS — BP 126/74 | HR 72 | Temp 98.6°F | Wt 162.0 lb

## 2019-02-09 DIAGNOSIS — I10 Essential (primary) hypertension: Secondary | ICD-10-CM

## 2019-02-09 DIAGNOSIS — E039 Hypothyroidism, unspecified: Secondary | ICD-10-CM | POA: Diagnosis not present

## 2019-02-09 DIAGNOSIS — R0602 Shortness of breath: Secondary | ICD-10-CM

## 2019-02-09 DIAGNOSIS — R059 Cough, unspecified: Secondary | ICD-10-CM

## 2019-02-09 DIAGNOSIS — R05 Cough: Secondary | ICD-10-CM

## 2019-02-09 NOTE — Progress Notes (Signed)
Patient: Lynn Williams Female    DOB: [birth year]   85 y.o.   MRN: [id number] Visit Date: 02/09/2019  Today's Provider: [name], MD   Chief Complaint  Patient presents with  . Hyperthyroidism  . Cough  . Hypertension   Subjective:     HPI  Allergies  Allergen Reactions  . Sulfa Antibiotics Itching  She has had a partially productive cough with very mild SOB,no fever,no CP and no covid exposure.  NO PND/orthopnea.   Current Outpatient Medications:  .  ALPRAZolam (XANAX) 0.5 MG tablet, Take 1 tablet (0.5 mg total) by mouth at bedtime as needed for anxiety., Disp: 90 tablet, Rfl: 1 .  Calcium-Magnesium-Vitamin D (CALCIUM 500 PO), Take by mouth. am, Disp: , Rfl:  .  cetirizine (ZYRTEC) 10 MG tablet, Take by mouth as needed. , Disp: , Rfl:  .  hydrochlorothiazide (HYDRODIURIL) 25 MG tablet, TAKE 1 TABLET (25 MG TOTAL) BY MOUTH DAILY., Disp: 90 tablet, Rfl: 3 .  levothyroxine (SYNTHROID) 50 MCG tablet, TAKE 1 TABLET EVERY DAY, Disp: 90 tablet, Rfl: 1 .  losartan (COZAAR) 50 MG tablet, Take 1 tablet (50 mg total) by mouth daily., Disp: 90 tablet, Rfl: 3 .  Multiple Vitamins-Minerals (MULTIVITAMIN ADULT PO), Take by mouth. am, Disp: , Rfl:  .  pantoprazole (PROTONIX) 40 MG tablet, TAKE 1 TABLET TWICE DAILY (Patient taking differently: Take 40 mg by mouth daily. ), Disp: 180 tablet, Rfl: 3 .  simvastatin (ZOCOR) 20 MG tablet, TAKE 1 TABLET AT BEDTIME, Disp: 90 tablet, Rfl: 3 .  doxycycline (VIBRAMYCIN) 100 MG capsule, Take 1 capsule (100 mg total) by mouth 2 (two) times daily. (Patient not taking: Reported on 02/09/2019), Disp: 14 capsule, Rfl: 0  Review of Systems  Constitutional: Negative.   Eyes: Negative.   Respiratory: Positive for cough. Negative for chest tightness and shortness of breath.   Cardiovascular: Negative.   Gastrointestinal: Negative.   Endocrine: Negative.   Genitourinary: Negative.   Musculoskeletal: Negative for myalgias.   Allergic/Immunologic: Negative.   Neurological: Negative.   Psychiatric/Behavioral: Negative.     Social History   Tobacco Use  . Smoking status: Never Smoker  . Smokeless tobacco: Never Used  Substance Use Topics  . Alcohol use: No    Alcohol/week: 0.0 standard drinks      Objective:   BP 126/74   Pulse 72   Temp 98.6 F (37 C) (Oral)   Wt 162 lb (73.5 kg)   LMP  (LMP Unknown)   SpO2 98%   BMI 29.63 kg/m  Vitals:   02/09/19 0833  BP: 126/74  Pulse: 72  Temp: 98.6 F (37 C)  TempSrc: Oral  SpO2: 98%  Weight: 162 lb (73.5 kg)     Physical Exam Vitals signs reviewed.  Constitutional:      Appearance: She is well-developed.  HENT:     Head: Normocephalic and atraumatic.     Right Ear: External ear normal.     Left Ear: External ear normal.     Nose: Nose normal.  Eyes:     General: Scleral icterus present.     Conjunctiva/sclera: Conjunctivae normal.  Cardiovascular:     Rate and Rhythm: Normal rate and regular rhythm.     Heart sounds: Normal heart sounds.  Pulmonary:     Effort: Pulmonary effort is normal. No respiratory distress.     Breath sounds: Normal breath sounds. No stridor.  Abdominal:     Palpations: Abdomen  is soft.  Skin:    General: Skin is warm and dry.  Neurological:     Mental Status: She is alert and oriented to person, place, and time.  Psychiatric:        Behavior: Behavior normal.        Thought Content: Thought content normal.        Judgment: Judgment normal.      No results found for any visits on 02/09/19.     Assessment & Plan    1. Essential (primary) hypertension Controlled.  2. Adult hypothyroidism   3. Cough Treat as bronchitis with Doxycycline. - Novel Coronavirus, NAA ([facility])  [place] testing site only  4. Shortness of breath Low risk covid. No s/s of cardiac or PE - Novel Coronavirus, NAA ([facility])  [place] testing site only     [name], MD  [place]

## 2019-02-13 LAB — NOVEL CORONAVIRUS, NAA: SARS-CoV-2, NAA: NOT DETECTED

## 2019-02-23 ENCOUNTER — Other Ambulatory Visit: Payer: Self-pay | Admitting: Family Medicine

## 2019-02-23 ENCOUNTER — Other Ambulatory Visit: Payer: Self-pay

## 2019-02-23 DIAGNOSIS — Z1231 Encounter for screening mammogram for malignant neoplasm of breast: Secondary | ICD-10-CM

## 2019-04-01 ENCOUNTER — Ambulatory Visit
Admission: RE | Admit: 2019-04-01 | Discharge: 2019-04-01 | Disposition: A | Discharge status: Home / Self Care | Payer: Medicare HMO | Source: Ambulatory Visit | Attending: Family Medicine | Admitting: Family Medicine

## 2019-04-01 DIAGNOSIS — Z1231 Encounter for screening mammogram for malignant neoplasm of breast: Secondary | ICD-10-CM

## 2019-04-20 ENCOUNTER — Other Ambulatory Visit: Payer: Self-pay | Admitting: Family Medicine

## 2019-04-20 DIAGNOSIS — R1013 Epigastric pain: Secondary | ICD-10-CM

## 2019-05-07 ENCOUNTER — Ambulatory Visit (INDEPENDENT_AMBULATORY_CARE_PROVIDER_SITE_OTHER): Payer: Medicare HMO | Admitting: Family Medicine

## 2019-05-07 ENCOUNTER — Encounter: Payer: Self-pay | Admitting: Family Medicine

## 2019-05-07 ENCOUNTER — Other Ambulatory Visit: Payer: Self-pay

## 2019-05-07 VITALS — BP 124/70 | HR 64 | Temp 98.1°F | Resp 16 | Ht 62.0 in | Wt 160.0 lb

## 2019-05-07 DIAGNOSIS — Z23 Encounter for immunization: Secondary | ICD-10-CM | POA: Diagnosis not present

## 2019-05-07 DIAGNOSIS — Z0000 Encounter for general adult medical examination without abnormal findings: Secondary | ICD-10-CM

## 2019-05-07 DIAGNOSIS — R109 Unspecified abdominal pain: Secondary | ICD-10-CM

## 2019-05-07 DIAGNOSIS — E7800 Pure hypercholesterolemia, unspecified: Secondary | ICD-10-CM | POA: Diagnosis not present

## 2019-05-07 LAB — POCT URINALYSIS DIPSTICK
Bilirubin, UA: NEGATIVE
Blood, UA: NEGATIVE
Glucose, UA: NEGATIVE
Ketones, UA: NEGATIVE
Nitrite, UA: NEGATIVE
Protein, UA: NEGATIVE
Spec Grav, UA: 1.025 (ref 1.010–1.025)
Urobilinogen, UA: 0.2 E.U./dL
pH, UA: 6.5 (ref 5.0–8.0)

## 2019-05-07 MED ORDER — DOXYCYCLINE HYCLATE 100 MG PO TABS: 100.0000 mg | ORAL_TABLET | Freq: Two times a day (BID) | ORAL | 0 refills | Status: DC

## 2019-05-07 NOTE — Progress Notes (Signed)
Patient: Lynn Williams, Female    DOB: July 08, 1932, 85 y.o.   MRN: [id number] Visit Date: 05/07/2019  Today's Provider: [name], MD   Chief Complaint  Patient presents with  . Annual Exam   Subjective:   Patient had AWE on 12/10/2018.    Annual physical visit Lynn Williams is a 85 y.o. female. She feels well. She reports exercising not regularly, but she does stay active. She reports she is sleeping well.  BMD- 09/22/12 Colonoscopy- 12/17/06  Mammogram- 04/01/2019. Normal.  Cologuard 09/19/16 negative    Endometrial Biopsy by Dr. Enzo Bi Benign.   Immunization History  Administered Date(s) Administered  . Influenza, High Dose Seasonal PF 05/04/2015, 05/14/2016, 05/08/2017, 04/03/2018  . Pneumococcal Conjugate-13 12/23/2013  . Pneumococcal Polysaccharide-23 07/24/1996, 08/04/2003      Review of Systems  Constitutional: Negative.   HENT: Negative.   Eyes: Negative.   Respiratory: Negative.   Cardiovascular: Negative.   Gastrointestinal: Negative.   Endocrine: Negative.   Genitourinary: Negative.   Musculoskeletal: Negative.   Skin: Negative.   Allergic/Immunologic: Negative.   Neurological: Negative.   Hematological: Negative.   Psychiatric/Behavioral: Negative.     Social History   Socioeconomic History  . Marital status: Married    Spouse name: Not on file  . Number of children: 0  . Years of education: Not on file  . Highest education level: GED or equivalent  Occupational History  . Occupation: retired  Scientific laboratory technician  . Financial resource strain: Not hard at all  . Food insecurity    Worry: Never true    Inability: Never true  . Transportation needs    Medical: No    Non-medical: No  Tobacco Use  . Smoking status: Never Smoker  . Smokeless tobacco: Never Used  Substance and Sexual Activity  . Alcohol use: No    Alcohol/week: 0.0 standard drinks  . Drug use: No  . Sexual activity: Not on file  Lifestyle  .  Physical activity    Days per week: 0 days    Minutes per session: 0 min  . Stress: Only a little  Relationships  . Social Herbalist on phone: Patient refused    Gets together: Patient refused    Attends religious service: Patient refused    Active member of club or organization: Patient refused    Attends meetings of clubs or organizations: Patient refused    Relationship status: Patient refused  . Intimate partner violence    Fear of current or ex partner: Patient refused    Emotionally abused: Patient refused    Physically abused: Patient refused    Forced sexual activity: Patient refused  Other Topics Concern  . Not on file  Social History Narrative  . Not on file    Past Medical History:  Diagnosis Date  . Arthritis    hands and wrist and knees  . Cough    chronic/allergies  . GERD (gastroesophageal reflux disease)   . Hyperlipidemia   . Hypertension    controlled on meds  . Hypothyroidism   . Wears partial dentures    upper     Patient Active Problem List   Diagnosis Date Noted  . Abdominal pain, epigastric   . Gastritis   . Hiatal hernia   . Cervical stenosis (uterine cervix) 01/12/2016  . Abnormal ultrasound of endometrium 01/12/2016  . Abnormal collection of fluid in uterine cavity 01/12/2016  . Pelvic pain in female  01/12/2016  . Medicare annual wellness visit, subsequent 01/25/2015  . Allergic rhinitis 11/26/2014  . Basal cell carcinoma of nose 11/26/2014  . DD (diverticular disease) 11/26/2014  . Bloodgood disease 11/26/2014  . Essential (primary) hypertension 11/26/2014  . Hypercholesteremia 11/26/2014  . Adult hypothyroidism 11/26/2014  . Cannot sleep 11/26/2014  . Arthritis, degenerative 11/26/2014    Past Surgical History:  Procedure Laterality Date  . BREAST CYST ASPIRATION    . BREAST EXCISIONAL BIOPSY Right 1978   benign  . CATARACT EXTRACTION Bilateral   . ESOPHAGOGASTRODUODENOSCOPY (EGD) WITH PROPOFOL N/A 01/16/2016    Procedure: ESOPHAGOGASTRODUODENOSCOPY (EGD) WITH PROPOFOL;  Surgeon: Lucilla Lame, MD;  Location: [facility];  Service: Endoscopy;  Laterality: N/A;  . TUBAL LIGATION      Her family history includes Alzheimer's disease in her sister; COPD in her brother and brother; Coronary artery disease in her sister and sister; Diabetes in her sister; Heart attack in her brother, brother, mother, sister, and sister; Heart disease in her brother, brother, and sister; Hypertension in her mother and sister; Prostate cancer in her father; Stroke in her mother and sister. There is no history of Breast cancer.   Current Outpatient Medications:  .  ALPRAZolam (XANAX) 0.5 MG tablet, Take 1 tablet (0.5 mg total) by mouth at bedtime as needed for anxiety., Disp: 90 tablet, Rfl: 1 .  Calcium-Magnesium-Vitamin D (CALCIUM 500 PO), Take by mouth. am, Disp: , Rfl:  .  cetirizine (ZYRTEC) 10 MG tablet, Take by mouth as needed. , Disp: , Rfl:  .  hydrochlorothiazide (HYDRODIURIL) 25 MG tablet, TAKE 1 TABLET (25 MG TOTAL) BY MOUTH DAILY., Disp: 90 tablet, Rfl: 3 .  levothyroxine (SYNTHROID) 50 MCG tablet, TAKE 1 TABLET EVERY DAY, Disp: 90 tablet, Rfl: 1 .  losartan (COZAAR) 50 MG tablet, Take 1 tablet (50 mg total) by mouth daily., Disp: 90 tablet, Rfl: 3 .  Multiple Vitamins-Minerals (MULTIVITAMIN ADULT PO), Take by mouth. am, Disp: , Rfl:  .  pantoprazole (PROTONIX) 40 MG tablet, TAKE 1 TABLET TWICE DAILY, Disp: 180 tablet, Rfl: 3 .  simvastatin (ZOCOR) 20 MG tablet, TAKE 1 TABLET AT BEDTIME, Disp: 90 tablet, Rfl: 3 .  doxycycline (VIBRAMYCIN) 100 MG capsule, Take 1 capsule (100 mg total) by mouth 2 (two) times daily. (Patient not taking: Reported on 02/09/2019), Disp: 14 capsule, Rfl: 0  Patient Care Team: Jerrol Banana., MD as PCP - General (Family Medicine) Birder Robson, MD as Referring Physician (Ophthalmology)    Objective:    Vitals: BP 124/70   Pulse 64   Temp 98.1 F (36.7 C)   Resp 16    Ht 5\' 2"  (1.575 m)   Wt 160 lb (72.6 kg)   LMP  (LMP Unknown)   SpO2 98%   BMI 29.26 kg/m   Physical Exam Vitals signs reviewed.  Constitutional:      Appearance: She is well-developed.  HENT:     Head: Normocephalic and atraumatic.     Right Ear: External ear normal.     Left Ear: External ear normal.     Nose: Nose normal.  Eyes:     Conjunctiva/sclera: Conjunctivae normal.     Pupils: Pupils are equal, round, and reactive to light.  Neck:     Musculoskeletal: Normal range of motion and neck supple.  Cardiovascular:     Rate and Rhythm: Normal rate and regular rhythm.     Heart sounds: Normal heart sounds.  Pulmonary:     Effort: Pulmonary  effort is normal.     Breath sounds: Normal breath sounds.  Chest:     Breasts: Breasts are symmetrical.        Right: Normal. No swelling, bleeding, inverted nipple, mass, nipple discharge, skin change or tenderness.        Left: Normal. No swelling, bleeding, inverted nipple, mass, nipple discharge, skin change or tenderness.  Abdominal:     General: Bowel sounds are normal.     Palpations: Abdomen is soft.  Genitourinary:    Comments: Breast exam without mass. Musculoskeletal: Normal range of motion.  Skin:    General: Skin is warm and dry.  Neurological:     General: No focal deficit present.     Mental Status: She is alert and oriented to person, place, and time.  Psychiatric:        Mood and Affect: Mood normal.        Behavior: Behavior normal.        Thought Content: Thought content normal.        Judgment: Judgment normal.     Activities of Daily Living In your present state of health, do you have any difficulty performing the following activities: 12/10/2018  Hearing? N  Vision? N  Comment Wears eye glasses daily.   Difficulty concentrating or making decisions? N  Walking or climbing stairs? N  Dressing or bathing? N  Doing errands, shopping? N  Preparing Food and eating ? N  Using the Toilet? N  In the past  six months, have you accidently leaked urine? N  Do you have problems with loss of bowel control? Y  Comment Occasionally, wears protection at all times. Unsure of cause.   Managing your Medications? N  Managing your Finances? N  Housekeeping or managing your Housekeeping? N  Some recent data might be hidden    Fall Risk Assessment Fall Risk  12/10/2018 12/03/2017 08/28/2016 08/25/2015 01/25/2015  Falls in the past year? 0 No No No No     Depression Screen PHQ 2/9 Scores 12/10/2018 12/10/2018 12/03/2017 08/28/2016  PHQ - 2 Score 0 0 0 0  PHQ- 9 Score 2 - - -    6CIT Screen 12/10/2018  What Year? 0 points  What month? 0 points  What time? 0 points  Count back from 20 0 points  Months in reverse 0 points  Repeat phrase 0 points  Total Score 0      Assessment & Plan:     Annual Wellness Visit  Reviewed patient's Family Medical History Reviewed and updated list of patient's medical providers Assessment of cognitive impairment was done Assessed patient's functional ability Established a written schedule for health screening [place] Completed and Reviewed  Exercise Activities and Dietary recommendations Goals    . DIET - REDUCE SUGAR INTAKE     Recommend cutting out all sugar in diet to help aid in weight loss and decrease BMI.    . Increase water intake     Starting 08/28/16, I will increase my water intake to 5 glasses a day.        Health Maintenance  Topic Date Due  . DEXA SCAN  12/24/2014  . INFLUENZA VACCINE  02/21/2019  . TETANUS/TDAP  12/24/2023  . PNA vac Low Risk Adult  Completed     Discussed health benefits of physical activity, and encouraged her to engage in regular exercise appropriate for her age and condition.    1. Annual physical exam   2. Flank pain/UTI  -  POCT urinalysis dipstick - doxycycline (VIBRA-TABS) 100 MG tablet; Take 1 tablet (100 mg total) by mouth 2 (two) times daily.  Dispense: 20 tablet; Refill: 0 - Urine  Culture  3. Hypercholesteremia  - CBC with Differential/Platelet - Comprehensive metabolic panel - Lipid panel - TSH  4. Need for influenza vaccination  - Flu Vaccine QUAD High Dose(Fluad)   [name], MD  [facility]

## 2019-05-08 LAB — COMPREHENSIVE METABOLIC PANEL
ALT: 11 IU/L (ref 0–32)
AST: 23 IU/L (ref 0–40)
Albumin/Globulin Ratio: 1.8 (ref 1.2–2.2)
Albumin: 4.3 g/dL (ref 3.6–4.6)
Alkaline Phosphatase: 76 IU/L (ref 39–117)
BUN/Creatinine Ratio: 11 — ABNORMAL LOW (ref 12–28)
BUN: 12 mg/dL (ref 8–27)
Bilirubin Total: 0.5 mg/dL (ref 0.0–1.2)
CO2: 26 mmol/L (ref 20–29)
Calcium: 9.5 mg/dL (ref 8.7–10.3)
Chloride: 93 mmol/L — ABNORMAL LOW (ref 96–106)
Creatinine, Ser: 1.11 mg/dL — ABNORMAL HIGH (ref 0.57–1.00)
GFR calc Af Amer: 53 mL/min/{1.73_m2} — ABNORMAL LOW (ref >59)
GFR calc non Af Amer: 46 mL/min/{1.73_m2} — ABNORMAL LOW (ref >59)
Globulin, Total: 2.4 g/dL (ref 1.5–4.5)
Glucose: 87 mg/dL (ref 65–99)
Potassium: 4 mmol/L (ref 3.5–5.2)
Sodium: 133 mmol/L — ABNORMAL LOW (ref 134–144)
Total Protein: 6.7 g/dL (ref 6.0–8.5)

## 2019-05-08 LAB — CBC WITH DIFFERENTIAL/PLATELET
Basophils Absolute: 0 10*3/uL (ref 0.0–0.2)
Basos: 1 %
EOS (ABSOLUTE): 0.1 10*3/uL (ref 0.0–0.4)
Eos: 2 %
Hematocrit: 36.1 % (ref 34.0–46.6)
Hemoglobin: 12.1 g/dL (ref 11.1–15.9)
Immature Grans (Abs): 0 10*3/uL (ref 0.0–0.1)
Immature Granulocytes: 0 %
Lymphocytes Absolute: 1.7 10*3/uL (ref 0.7–3.1)
Lymphs: 31 %
MCH: 29.7 pg (ref 26.6–33.0)
MCHC: 33.5 g/dL (ref 31.5–35.7)
MCV: 89 fL (ref 79–97)
Monocytes Absolute: 0.5 10*3/uL (ref 0.1–0.9)
Monocytes: 9 %
Neutrophils Absolute: 3.1 10*3/uL (ref 1.4–7.0)
Neutrophils: 57 %
Platelets: 279 10*3/uL (ref 150–450)
RBC: 4.08 x10E6/uL (ref 3.77–5.28)
RDW: 12.5 % (ref 11.7–15.4)
WBC: 5.5 10*3/uL (ref 3.4–10.8)

## 2019-05-08 LAB — LIPID PANEL
Chol/HDL Ratio: 2.2 ratio (ref 0.0–4.4)
Cholesterol, Total: 143 mg/dL (ref 100–199)
HDL: 65 mg/dL (ref >39)
LDL Chol Calc (NIH): 64 mg/dL (ref 0–99)
Triglycerides: 72 mg/dL (ref 0–149)
VLDL Cholesterol Cal: 14 mg/dL (ref 5–40)

## 2019-05-08 LAB — TSH: TSH: 6 u[IU]/mL — ABNORMAL HIGH (ref 0.450–4.500)

## 2019-05-09 LAB — URINE CULTURE

## 2019-05-18 ENCOUNTER — Telehealth: Payer: Self-pay

## 2019-05-18 NOTE — Telephone Encounter (Signed)
-----   Message from Jerrol Banana., MD sent at 05/18/2019 11:22 AM EDT ----- Labs okay.  Is patient taking her Synthroid?

## 2019-05-18 NOTE — Telephone Encounter (Signed)
RTC 3-6 months to recheck.

## 2019-05-18 NOTE — Telephone Encounter (Signed)
Advised patient of results. Patient reports that she is taking levothyroxine 61mcg once daily. Please advise. Thanks!

## 2019-05-18 NOTE — Telephone Encounter (Signed)
Advised patient as below.  

## 2019-05-18 NOTE — Telephone Encounter (Signed)
Please advise results? 

## 2019-05-18 NOTE — Telephone Encounter (Signed)
Pt is requesting a call back to get her lab results from 05/07/2019. Please advise. Thanks TNP

## 2019-06-10 ENCOUNTER — Other Ambulatory Visit: Payer: Self-pay | Admitting: Family Medicine

## 2019-06-19 ENCOUNTER — Other Ambulatory Visit: Payer: Self-pay | Admitting: Family Medicine

## 2019-06-19 DIAGNOSIS — I10 Essential (primary) hypertension: Secondary | ICD-10-CM

## 2019-06-26 NOTE — Progress Notes (Signed)
Patient: Lynn Williams Female    DOB: 11-25-1931   85 y.o.   MRN: [id number] Visit Date: 07/01/2019  Today's Provider: [name], MD   Chief Complaint  Patient presents with  . Follow-up  . Flank Pain  . Neck Pain   Subjective:     Flank Pain The problem has been resolved since onset. The patient is experiencing no pain. Pertinent negatives include no abdominal pain, chest pain, fever or weakness.  Shoulder Pain  The pain is present in the right shoulder and neck. This is a new problem. The current episode started 1 to 4 weeks ago. The problem occurs intermittently. The pain is at a severity of 2/10. The pain is mild. Pertinent negatives include no fever. She has tried rest for the symptoms. The treatment provided mild relief.  She is now back to normal.  She is pain-free.  No complaints at all today.  Flank pain/UTI From 05/07/2019-given rx doxycycline (VIBRA-TABS) 100 MG tablet; Take 1 tablet (100 mg total) by mouth 2 (two) times daily.    Allergies  Allergen Reactions  . Sulfa Antibiotics Itching     Current Outpatient Medications:  .  ALPRAZolam (XANAX) 0.5 MG tablet, Take 1 tablet (0.5 mg total) by mouth at bedtime as needed for anxiety., Disp: 90 tablet, Rfl: 1 .  Calcium-Magnesium-Vitamin D (CALCIUM 500 PO), Take by mouth. am, Disp: , Rfl:  .  cetirizine (ZYRTEC) 10 MG tablet, Take by mouth as needed. , Disp: , Rfl:  .  doxycycline (VIBRA-TABS) 100 MG tablet, Take 1 tablet (100 mg total) by mouth 2 (two) times daily., Disp: 20 tablet, Rfl: 0 .  doxycycline (VIBRAMYCIN) 100 MG capsule, Take 1 capsule (100 mg total) by mouth 2 (two) times daily. (Patient not taking: Reported on 02/09/2019), Disp: 14 capsule, Rfl: 0 .  hydrochlorothiazide (HYDRODIURIL) 25 MG tablet, TAKE 1 TABLET EVERY DAY, Disp: 90 tablet, Rfl: 0 .  levothyroxine (SYNTHROID) 50 MCG tablet, TAKE 1 TABLET EVERY DAY, Disp: 90 tablet, Rfl: 1 .  losartan (COZAAR) 50 MG tablet, Take 1  tablet (50 mg total) by mouth daily., Disp: 90 tablet, Rfl: 3 .  Multiple Vitamins-Minerals (MULTIVITAMIN ADULT PO), Take by mouth. am, Disp: , Rfl:  .  pantoprazole (PROTONIX) 40 MG tablet, TAKE 1 TABLET TWICE DAILY, Disp: 180 tablet, Rfl: 3 .  simvastatin (ZOCOR) 20 MG tablet, TAKE 1 TABLET AT BEDTIME, Disp: 90 tablet, Rfl: 3  Review of Systems  Constitutional: Negative for appetite change, chills, fatigue and fever.  HENT: Negative.   Eyes: Negative.   Respiratory: Negative for chest tightness and shortness of breath.   Cardiovascular: Negative for chest pain and palpitations.  Gastrointestinal: Negative for abdominal pain, nausea and vomiting.  Endocrine: Negative.   Genitourinary: Negative.  Negative for flank pain.  Musculoskeletal: Positive for arthralgias and neck pain.  Skin: Negative.   Allergic/Immunologic: Negative.   Neurological: Negative for dizziness and weakness.  Hematological: Negative.   Psychiatric/Behavioral: Negative.     Social History   Tobacco Use  . Smoking status: Never Smoker  . Smokeless tobacco: Never Used  Substance Use Topics  . Alcohol use: No    Alcohol/week: 0.0 standard drinks      Objective:   BP (!) 144/80   Pulse 60   Temp (!) 97.1 F (36.2 C) (Temporal)   Resp 16   Ht 5\' 2"  (1.575 m)   Wt 160 lb 12.8 oz (72.9 kg)   LMP  (  LMP Unknown)   BMI 29.41 kg/m  Vitals:   07/01/19 1115  BP: (!) 144/80  Pulse: 60  Resp: 16  Temp: (!) 97.1 F (36.2 C)  TempSrc: Temporal  Weight: 160 lb 12.8 oz (72.9 kg)  Height: 5\' 2"  (1.575 m)  Body mass index is 29.41 kg/m.   Physical Exam Vitals reviewed.  Constitutional:      Appearance: She is well-developed.  HENT:     Head: Normocephalic and atraumatic.     Right Ear: External ear normal.     Left Ear: External ear normal.     Nose: Nose normal.  Eyes:     Conjunctiva/sclera: Conjunctivae normal.  Cardiovascular:     Rate and Rhythm: Normal rate and regular rhythm.     Heart  sounds: Normal heart sounds.  Pulmonary:     Effort: Pulmonary effort is normal.     Breath sounds: Normal breath sounds.  Abdominal:     Palpations: Abdomen is soft.  Skin:    General: Skin is warm and dry.  Neurological:     Mental Status: She is alert and oriented to person, place, and time.  Psychiatric:        Behavior: Behavior normal.        Thought Content: Thought content normal.        Judgment: Judgment normal.      No results found for any visits on 07/01/19.     Assessment & Plan    1. Flank pain Resolved.  She is finished her doxycycline  2. Essential (primary) hypertension Controlled on losartan and HCTZ  3. Adult hypothyroidism On Synthroid 50 mcg daily  4. Hypercholesteremia On Zocor 20 mg daily. Return to clinic 6 months     [name], MD  [facility]

## 2019-07-01 ENCOUNTER — Other Ambulatory Visit: Payer: Self-pay

## 2019-07-01 ENCOUNTER — Encounter: Payer: Self-pay | Admitting: Family Medicine

## 2019-07-01 ENCOUNTER — Ambulatory Visit (INDEPENDENT_AMBULATORY_CARE_PROVIDER_SITE_OTHER): Payer: Medicare HMO | Admitting: Family Medicine

## 2019-07-01 VITALS — BP 144/80 | HR 60 | Temp 97.1°F | Resp 16 | Ht 62.0 in | Wt 160.8 lb

## 2019-07-01 DIAGNOSIS — I10 Essential (primary) hypertension: Secondary | ICD-10-CM | POA: Diagnosis not present

## 2019-07-01 DIAGNOSIS — E039 Hypothyroidism, unspecified: Secondary | ICD-10-CM

## 2019-07-01 DIAGNOSIS — E7800 Pure hypercholesterolemia, unspecified: Secondary | ICD-10-CM

## 2019-07-01 DIAGNOSIS — R109 Unspecified abdominal pain: Secondary | ICD-10-CM

## 2019-07-06 ENCOUNTER — Other Ambulatory Visit: Payer: Self-pay | Admitting: Family Medicine

## 2019-07-06 DIAGNOSIS — I10 Essential (primary) hypertension: Secondary | ICD-10-CM

## 2019-07-27 ENCOUNTER — Other Ambulatory Visit: Payer: Self-pay | Admitting: Family Medicine

## 2019-08-03 ENCOUNTER — Other Ambulatory Visit: Payer: Self-pay | Admitting: Family Medicine

## 2019-08-03 DIAGNOSIS — I10 Essential (primary) hypertension: Secondary | ICD-10-CM

## 2019-08-18 NOTE — Telephone Encounter (Signed)
Patient called to check the status of her refill.  Please advise and call patient back to discuss at [phone]

## 2019-08-18 NOTE — Telephone Encounter (Signed)
Please advise 

## 2019-08-19 MED ORDER — HYDROCHLOROTHIAZIDE 25 MG PO TABS: 25.0000 mg | ORAL_TABLET | Freq: Every day | ORAL | 3 refills | Status: DC

## 2019-08-19 NOTE — Addendum Note (Signed)
Addended by: Julieta Bellini on: 08/19/2019 01:23 PM   Modules accepted: Orders

## 2019-08-19 NOTE — Telephone Encounter (Signed)
Rx was sent to pharmacy. 

## 2019-08-19 NOTE — Telephone Encounter (Signed)
She can have a year of refills.

## 2019-11-19 ENCOUNTER — Other Ambulatory Visit: Payer: Self-pay | Admitting: Family Medicine

## 2019-12-10 NOTE — Progress Notes (Signed)
Subjective:   Lynn Williams is a 86 y.o. female who presents for Medicare Annual (Subsequent) preventive examination.  I connected with Marcee Lallo today by telephone and verified that I am speaking with the correct person using two identifiers. Location patient: home Location provider: work Persons participating in the virtual visit: patient, provider.   I discussed the limitations, risks, security and privacy concerns of performing an evaluation and management service by telephone and the availability of in person appointments. I also discussed with the patient that there may be a patient responsible charge related to this service. The patient expressed understanding and verbally consented to this telephonic visit.    Interactive audio and video telecommunications were attempted between this provider and patient, however failed, due to patient having technical difficulties OR patient did not have access to video capability.  We continued and completed visit with audio only.  Review of Systems:  N/A  Cardiac Risk Factors include: advanced age (>41men, >70 women);dyslipidemia;hypertension     Objective:     Vitals: LMP  (LMP Unknown)   There is no height or weight on file to calculate BMI.  Advanced Directives 12/14/2019 12/10/2018 12/03/2017 08/28/2016 01/16/2016 12/22/2015 11/28/2015  Does Patient Have a Medical Advance Directive? Yes Yes Yes Yes Yes Yes Yes  Type of Paramedic of [place];Living will [place];Living will [place];Living will Living will;Healthcare Power of [place];Living will [place];Living will -  Does patient want to make changes to medical advance directive? - - - - No - Patient declined - -  Copy of [place] in Chart? Yes - validated most recent copy scanned in chart (See row information) Yes - validated most recent copy scanned  in chart (See row information) No - copy requested No - copy requested No - copy requested - -    Tobacco Social History   Tobacco Use  Smoking Status Never Smoker  Smokeless Tobacco Never Used     Counseling given: Not Answered   Clinical Intake:  Pre-visit preparation completed: Yes  Pain : No/denies pain Pain Score: 0-No pain     Nutritional Risks: None Diabetes: No  How often do you need to have someone help you when you read instructions, pamphlets, or other written materials from your doctor or pharmacy?: 1 - Never  Interpreter Needed?: No  Information entered by :: [facility], LPN  Past Medical History:  Diagnosis Date  . Arthritis    hands and wrist and knees  . Cough    chronic/allergies  . GERD (gastroesophageal reflux disease)   . Hyperlipidemia   . Hypertension    controlled on meds  . Hypothyroidism   . Wears partial dentures    upper   Past Surgical History:  Procedure Laterality Date  . BREAST CYST ASPIRATION    . BREAST EXCISIONAL BIOPSY Right 1978   benign  . CATARACT EXTRACTION Bilateral   . ESOPHAGOGASTRODUODENOSCOPY (EGD) WITH PROPOFOL N/A 01/16/2016   Procedure: ESOPHAGOGASTRODUODENOSCOPY (EGD) WITH PROPOFOL;  Surgeon: Lucilla Lame, MD;  Location: [facility];  Service: Endoscopy;  Laterality: N/A;  . TUBAL LIGATION     Family History  Problem Relation Age of Onset  . Hypertension Mother   . Heart attack Mother   . Stroke Mother   . Prostate cancer Father   . Heart attack Brother   . Stroke Sister   . Heart attack Sister   . Coronary artery  disease Sister   . Alzheimer's disease Sister   . Heart attack Brother   . COPD Brother   . Heart attack Sister   . Coronary artery disease Sister   . Diabetes Sister   . Hypertension Sister   . Heart disease Sister   . Heart disease Brother   . Heart disease Brother   . COPD Brother   . Breast cancer Neg Hx    Social History   Socioeconomic History  . Marital status:  Married    Spouse name: Not on file  . Number of children: 0  . Years of education: Not on file  . Highest education level: GED or equivalent  Occupational History  . Occupation: retired  Tobacco Use  . Smoking status: Never Smoker  . Smokeless tobacco: Never Used  Substance and Sexual Activity  . Alcohol use: No    Alcohol/week: 0.0 standard drinks  . Drug use: No  . Sexual activity: Not on file  Other Topics Concern  . Not on file  Social History Narrative  . Not on file   Social Determinants of Health   Financial Resource Strain: Low Risk   . Difficulty of Paying Living Expenses: Not hard at all  Food Insecurity: No Food Insecurity  . Worried About Charity fundraiser in the Last Year: Never true  . Ran Out of Food in the Last Year: Never true  Transportation Needs: No Transportation Needs  . Lack of Transportation (Medical): No  . Lack of Transportation (Non-Medical): No  Physical Activity: Sufficiently Active  . Days of Exercise per Week: 4 days  . Minutes of Exercise per Session: 60 min  Stress: No Stress Concern Present  . Feeling of Stress : Not at all  Social Connections: Slightly Isolated  . Frequency of Communication with Friends and Family: More than three times a week  . Frequency of Social Gatherings with Friends and Family: More than three times a week  . Attends Religious Services: More than 4 times per year  . Active Member of Clubs or Organizations: No  . Attends Archivist Meetings: Never  . Marital Status: Married    Outpatient Encounter Medications as of 12/14/2019  Medication Sig  . Calcium-Magnesium-Vitamin D (CALCIUM 500 PO) Take by mouth. am  . cetirizine (ZYRTEC) 10 MG tablet Take by mouth as needed.   . hydrochlorothiazide (HYDRODIURIL) 25 MG tablet Take 1 tablet (25 mg total) by mouth daily.  Marland Kitchen levothyroxine (SYNTHROID) 50 MCG tablet TAKE 1 TABLET EVERY DAY  . losartan (COZAAR) 50 MG tablet TAKE 1 TABLET EVERY DAY  . Multiple  Vitamins-Minerals (MULTIVITAMIN ADULT PO) Take by mouth. am  . pantoprazole (PROTONIX) 40 MG tablet TAKE 1 TABLET TWICE DAILY (Patient taking differently: Take 40 mg by mouth daily. )  . simvastatin (ZOCOR) 20 MG tablet TAKE 1 TABLET AT BEDTIME  . ALPRAZolam (XANAX) 0.5 MG tablet Take 1 tablet (0.5 mg total) by mouth at bedtime as needed for anxiety. (Patient not taking: Reported on 12/14/2019)  . doxycycline (VIBRA-TABS) 100 MG tablet Take 1 tablet (100 mg total) by mouth 2 (two) times daily. (Patient not taking: Reported on 12/14/2019)  . doxycycline (VIBRAMYCIN) 100 MG capsule Take 1 capsule (100 mg total) by mouth 2 (two) times daily. (Patient not taking: Reported on 02/09/2019)   No facility-administered encounter medications on file as of 12/14/2019.    Activities of Daily Living In your present state of health, do you have any difficulty performing the  following activities: 12/14/2019  Hearing? N  Vision? N  Difficulty concentrating or making decisions? N  Walking or climbing stairs? N  Dressing or bathing? N  Doing errands, shopping? N  Preparing Food and eating ? N  Using the Toilet? N  In the past six months, have you accidently leaked urine? N  Do you have problems with loss of bowel control? N  Managing your Medications? N  Managing your Finances? N  Housekeeping or managing your Housekeeping? N  Some recent data might be hidden    Patient Care Team: Jerrol Banana., MD as PCP - General (Family Medicine) Birder Robson, MD as Referring Physician (Ophthalmology)    Assessment:   This is a routine wellness examination for [place].  Exercise Activities and Dietary recommendations Current Exercise Habits: Structured exercise class, Type of exercise: strength training/weights;stretching, Time (Minutes): 60, Frequency (Times/Week): 4, Weekly Exercise (Minutes/Week): 240, Intensity: Mild, Exercise limited by: None identified  Goals    . DIET - REDUCE SUGAR INTAKE      Recommend cutting out all sugar in diet to help aid in weight loss and decrease BMI.    . Increase water intake     Starting 08/28/16, I will increase my water intake to 5 glasses a day.       Fall Risk: Fall Risk  12/14/2019 12/10/2018 12/03/2017 08/28/2016 08/25/2015  Falls in the past year? 0 0 No No No  Number falls in past yr: 0 - - - -  Injury with Fall? 0 - - - -    FALL RISK PREVENTION PERTAINING TO THE HOME:  Any stairs in or around the home? Yes  If so, are there any without handrails? No   Home free of loose throw rugs in walkways, pet beds, electrical cords, etc? Yes  Adequate lighting in your home to reduce risk of falls? Yes   ASSISTIVE DEVICES UTILIZED TO PREVENT FALLS:  Life alert? No  Use of a cane, walker or w/c? No  Grab bars in the bathroom? No  Shower chair or bench in shower? No  Elevated toilet seat or a handicapped toilet? Yes    TIMED UP AND GO:  Was the test performed? No .    Depression Screen PHQ 2/9 Scores 12/14/2019 12/10/2018 12/10/2018 12/03/2017  PHQ - 2 Score 0 0 0 0  PHQ- 9 Score - 2 - -     Cognitive Function: Declined today.     6CIT Screen 12/10/2018 08/28/2016  What Year? 0 points 0 points  What month? 0 points 0 points  What time? 0 points 0 points  Count back from 20 0 points 0 points  Months in reverse 0 points 2 points  Repeat phrase 0 points 0 points  Total Score 0 2    Immunization History  Administered Date(s) Administered  . Fluad Quad(high Dose 65+) 05/07/2019  . Influenza, High Dose Seasonal PF 05/04/2015, 05/14/2016, 05/08/2017, 04/03/2018  . Pneumococcal Conjugate-13 12/23/2013  . Pneumococcal Polysaccharide-23 07/24/1996, 08/04/2003    Qualifies for Shingles Vaccine? Yes . Due for Shingrix. Pt has been advised to call insurance company to determine out of pocket expense. Advised may also receive vaccine at local pharmacy or Health Dept. Verbalized acceptance and understanding.  Tdap: Up to date  Flu Vaccine: Up to  date  Pneumococcal Vaccine: Completed series  Screening Tests Health Maintenance  Topic Date Due  . COVID-19 Vaccine (1) Never done  . DEXA SCAN  12/24/2014  . INFLUENZA VACCINE  02/21/2020  .  TETANUS/TDAP  12/24/2023  . PNA vac Low Risk Adult  Completed    Cancer Screenings:  Colorectal Screening: No longer required.   Mammogram: No longer required.   Bone Density: Completed 12/23/12. Results reflect OSTEOPOROSIS. Repeat every 2 years. Ordered today. Pt aware the office will call re: appt.  Lung Cancer Screening: (Low Dose CT Chest recommended if Age 56-80 years, 30 pack-year currently smoking OR have quit w/in 15years.) does not qualify.   Additional Screening:  Vision Screening: Recommended annual ophthalmology exams for early detection of glaucoma and other disorders of the eye.  Dental Screening: Recommended annual dental exams for proper oral hygiene  Community Resource Referral:  CRR required this visit? No     Plan:  I have personally reviewed and addressed the Medicare Annual Wellness questionnaire and have noted the following in the patient's chart:  A. Medical and social history B. Use of alcohol, tobacco or illicit drugs  C. Current medications and supplements D. Functional ability and status E.  Nutritional status F.  Physical activity G. Advance directives H. List of other physicians I.  Hospitalizations, surgeries, and ER visits in previous 12 months J.  [place] such as hearing and vision if needed, cognitive and depression L. Referrals and appointments   In addition, I have reviewed and discussed with patient certain preventive protocols, quality metrics, and best practice recommendations. A written personalized care plan for preventive services as well as general preventive health recommendations were provided to patient. Nurse Health Advisor  Signed,    Kiwana Deblasi Man[place], [place]  [phone] Nurse Health Advisor   Nurse Notes:  None.

## 2019-12-14 ENCOUNTER — Other Ambulatory Visit: Payer: Self-pay

## 2019-12-14 ENCOUNTER — Ambulatory Visit (INDEPENDENT_AMBULATORY_CARE_PROVIDER_SITE_OTHER): Payer: Medicare HMO

## 2019-12-14 DIAGNOSIS — E2839 Other primary ovarian failure: Secondary | ICD-10-CM | POA: Diagnosis not present

## 2019-12-14 DIAGNOSIS — Z0000 Encounter for general adult medical examination without abnormal findings: Secondary | ICD-10-CM | POA: Diagnosis not present

## 2019-12-14 NOTE — Patient Instructions (Signed)
Lynn Williams , Thank you for taking time to come for your Medicare Wellness Visit. I appreciate your ongoing commitment to your health goals. Please review the following plan we discussed and let me know if I can assist you in the future.   Screening recommendations/referrals: Colonoscopy: No longer required.  Mammogram: No longer required.  Bone Density: Ordered today. Pt aware the office will call re: appt. Recommended yearly ophthalmology/optometry visit for glaucoma screening and checkup Recommended yearly dental visit for hygiene and checkup  Vaccinations: Influenza vaccine: Up to date Pneumococcal vaccine: Completed series Tdap vaccine: Up to date, due 12/2023 Shingles vaccine: Pt declines today.     Advanced directives: Currently on file.  Conditions/risks identified: Recommend to continue to decrease sugar consumption to help with weight loss. Also recommend to monitor water intake and increase to 6-8 8 oz glasses everyday.  Next appointment: 12/31/19 @ 8:40 AM with Dr Rosanna Randy. Declined scheduling an AWV for 2022 at this time.  Preventive Care 34 Years and Older, Female Preventive care refers to lifestyle choices and visits with your health care provider that can promote health and wellness. What does preventive care include?  A yearly physical exam. This is also called an annual well check.  Dental exams once or twice a year.  Routine eye exams. Ask your health care provider how often you should have your eyes checked.  Personal lifestyle choices, including:  Daily care of your teeth and gums.  Regular physical activity.  Eating a healthy diet.  Avoiding tobacco and drug use.  Limiting alcohol use.  Practicing safe sex.  Taking low-dose aspirin every day.  Taking vitamin and mineral supplements as recommended by your health care provider. What happens during an annual well check? The services and screenings done by your health care provider during your  annual well check will depend on your age, overall health, lifestyle risk factors, and family history of disease. Counseling  Your health care provider may ask you questions about your:  Alcohol use.  Tobacco use.  Drug use.  Emotional well-being.  Home and relationship well-being.  Sexual activity.  Eating habits.  History of falls.  Memory and ability to understand (cognition).  Work and work Statistician.  Reproductive health. Screening  You may have the following tests or measurements:  Height, weight, and BMI.  Blood pressure.  Lipid and cholesterol levels. These may be checked every 5 years, or more frequently if you are over 58 years old.  Skin check.  Lung cancer screening. You may have this screening every year starting at age 37 if you have a 30-pack-year history of smoking and currently smoke or have quit within the past 15 years.  Fecal occult blood test (FOBT) of the stool. You may have this test every year starting at age 70.  Flexible sigmoidoscopy or colonoscopy. You may have a sigmoidoscopy every 5 years or a colonoscopy every 10 years starting at age 68.  Hepatitis C blood test.  Hepatitis B blood test.  Sexually transmitted disease (STD) testing.  Diabetes screening. This is done by checking your blood sugar (glucose) after you have not eaten for a while (fasting). You may have this done every 1-3 years.  Bone density scan. This is done to screen for osteoporosis. You may have this done starting at age 68.  Mammogram. This may be done every 1-2 years. Talk to your health care provider about how often you should have regular mammograms. Talk with your health care provider about your test  results, treatment options, and if necessary, the need for more tests. Vaccines  Your health care provider may recommend certain vaccines, such as:  Influenza vaccine. This is recommended every year.  Tetanus, diphtheria, and acellular pertussis (Tdap, Td)  vaccine. You may need a Td booster every 10 years.  Zoster vaccine. You may need this after age 23.  Pneumococcal 13-valent conjugate (PCV13) vaccine. One dose is recommended after age 39.  Pneumococcal polysaccharide (PPSV23) vaccine. One dose is recommended after age 64. Talk to your health care provider about which screenings and vaccines you need and how often you need them. This information is not intended to replace advice given to you by your health care provider. Make sure you discuss any questions you have with your health care provider. Document Released: 08/05/2015 Document Revised: 03/28/2016 Document Reviewed: 05/10/2015 Elsevier Interactive Patient Education  2017 [place] Prevention in the Home Falls can cause injuries. They can happen to people of all ages. There are many things you can do to make your home safe and to help prevent falls. What can I do on the outside of my home?  Regularly fix the edges of walkways and driveways and fix any cracks.  Remove anything that might make you trip as you walk through a door, such as a raised step or threshold.  Trim any bushes or trees on the path to your home.  Use bright outdoor lighting.  Clear any walking paths of anything that might make someone trip, such as rocks or tools.  Regularly check to see if handrails are loose or broken. Make sure that both sides of any steps have handrails.  Any raised decks and porches should have guardrails on the edges.  Have any leaves, snow, or ice cleared regularly.  Use sand or salt on walking paths during winter.  Clean up any spills in your garage right away. This includes oil or grease spills. What can I do in the bathroom?  Use night lights.  Install grab bars by the toilet and in the tub and shower. Do not use towel bars as grab bars.  Use non-skid mats or decals in the tub or shower.  If you need to sit down in the shower, use a plastic, non-slip  stool.  Keep the floor dry. Clean up any water that spills on the floor as soon as it happens.  Remove soap buildup in the tub or shower regularly.  Attach bath mats securely with double-sided non-slip rug tape.  Do not have throw rugs and other things on the floor that can make you trip. What can I do in the bedroom?  Use night lights.  Make sure that you have a light by your bed that is easy to reach.  Do not use any sheets or blankets that are too big for your bed. They should not hang down onto the floor.  Have a firm chair that has side arms. You can use this for support while you get dressed.  Do not have throw rugs and other things on the floor that can make you trip. What can I do in the kitchen?  Clean up any spills right away.  Avoid walking on wet floors.  Keep items that you use a lot in easy-to-reach places.  If you need to reach something above you, use a strong step stool that has a grab bar.  Keep electrical cords out of the way.  Do not use floor polish or wax that makes  floors slippery. If you must use wax, use non-skid floor wax.  Do not have throw rugs and other things on the floor that can make you trip. What can I do with my stairs?  Do not leave any items on the stairs.  Make sure that there are handrails on both sides of the stairs and use them. Fix handrails that are broken or loose. Make sure that handrails are as long as the stairways.  Check any carpeting to make sure that it is firmly attached to the stairs. Fix any carpet that is loose or worn.  Avoid having throw rugs at the top or bottom of the stairs. If you do have throw rugs, attach them to the floor with carpet tape.  Make sure that you have a light switch at the top of the stairs and the bottom of the stairs. If you do not have them, ask someone to add them for you. What else can I do to help prevent falls?  Wear shoes that:  Do not have high heels.  Have rubber bottoms.  Are  comfortable and fit you well.  Are closed at the toe. Do not wear sandals.  If you use a stepladder:  Make sure that it is fully opened. Do not climb a closed stepladder.  Make sure that both sides of the stepladder are locked into place.  Ask someone to hold it for you, if possible.  Clearly mark and make sure that you can see:  Any grab bars or handrails.  First and last steps.  Where the edge of each step is.  Use tools that help you move around (mobility aids) if they are needed. These include:  Canes.  Walkers.  Scooters.  Crutches.  Turn on the lights when you go into a dark area. Replace any light bulbs as soon as they burn out.  Set up your furniture so you have a clear path. Avoid moving your furniture around.  If any of your floors are uneven, fix them.  If there are any pets around you, be aware of where they are.  Review your medicines with your doctor. Some medicines can make you feel dizzy. This can increase your chance of falling. Ask your doctor what other things that you can do to help prevent falls. This information is not intended to replace advice given to you by your health care provider. Make sure you discuss any questions you have with your health care provider. Document Released: 05/05/2009 Document Revised: 12/15/2015 Document Reviewed: 08/13/2014 Elsevier Interactive Patient Education  2017 [employer].

## 2019-12-31 ENCOUNTER — Ambulatory Visit: Payer: Self-pay | Admitting: Family Medicine

## 2020-01-04 ENCOUNTER — Encounter: Payer: Self-pay | Admitting: Family Medicine

## 2020-01-04 ENCOUNTER — Other Ambulatory Visit: Payer: Self-pay

## 2020-01-04 ENCOUNTER — Ambulatory Visit (INDEPENDENT_AMBULATORY_CARE_PROVIDER_SITE_OTHER): Payer: Medicare HMO | Admitting: Family Medicine

## 2020-01-04 VITALS — BP 129/77 | HR 78 | Temp 97.1°F | Wt 163.4 lb

## 2020-01-04 DIAGNOSIS — E039 Hypothyroidism, unspecified: Secondary | ICD-10-CM | POA: Diagnosis not present

## 2020-01-04 DIAGNOSIS — H6121 Impacted cerumen, right ear: Secondary | ICD-10-CM | POA: Diagnosis not present

## 2020-01-04 DIAGNOSIS — I10 Essential (primary) hypertension: Secondary | ICD-10-CM | POA: Diagnosis not present

## 2020-01-04 NOTE — Progress Notes (Signed)
Established patient visit   Patient: Lynn Williams   DOB: June 01, 1932   86 y.o. Female  MRN: [id number] Visit Date: 01/04/2020  Today's healthcare provider: [name], MD   Chief Complaint  Patient presents with   Ear Fullness   Subjective    Ear Fullness  There is pain in the right ear. This is a new problem. The current episode started 1 to 4 weeks ago. The problem has been unchanged. There has been no fever. The patient is experiencing no pain. Associated symptoms include hearing loss and rhinorrhea. Pertinent negatives include no ear discharge, headaches, neck pain or sore throat. She has tried ear drops for the symptoms. The treatment provided no relief.   She has had this before and has been cerumen blocking the canal.      Medications: Outpatient Medications Prior to Visit  Medication Sig   Calcium-Magnesium-Vitamin D (CALCIUM 500 PO) Take by mouth. am   cetirizine (ZYRTEC) 10 MG tablet Take by mouth as needed.    hydrochlorothiazide (HYDRODIURIL) 25 MG tablet Take 1 tablet (25 mg total) by mouth daily.   levothyroxine (SYNTHROID) 50 MCG tablet TAKE 1 TABLET EVERY DAY   losartan (COZAAR) 50 MG tablet TAKE 1 TABLET EVERY DAY   Multiple Vitamins-Minerals (MULTIVITAMIN ADULT PO) Take by mouth. am   pantoprazole (PROTONIX) 40 MG tablet TAKE 1 TABLET TWICE DAILY (Patient taking differently: Take 40 mg by mouth daily. )   simvastatin (ZOCOR) 20 MG tablet TAKE 1 TABLET AT BEDTIME   ALPRAZolam (XANAX) 0.5 MG tablet Take 1 tablet (0.5 mg total) by mouth at bedtime as needed for anxiety. (Patient not taking: Reported on 12/14/2019)   doxycycline (VIBRA-TABS) 100 MG tablet Take 1 tablet (100 mg total) by mouth 2 (two) times daily. (Patient not taking: Reported on 12/14/2019)   doxycycline (VIBRAMYCIN) 100 MG capsule Take 1 capsule (100 mg total) by mouth 2 (two) times daily. (Patient not taking: Reported on 02/09/2019)   No facility-administered  medications prior to visit.    Review of Systems  Constitutional: Negative.   HENT: Positive for hearing loss and rhinorrhea. Negative for ear discharge and sore throat.   Eyes: Negative.   Respiratory: Negative.   Musculoskeletal: Negative for neck pain.  Allergic/Immunologic: Negative.   Neurological: Negative for headaches.  Hematological: Negative.   Psychiatric/Behavioral: Negative.        Objective    BP 129/77 (BP Location: Right Arm, Patient Position: Sitting, Cuff Size: Normal)    Pulse 78    Temp (!) 97.1 F (36.2 C) (Temporal)    Wt 163 lb 6.4 oz (74.1 kg)    LMP  (LMP Unknown)    BMI 29.89 kg/m     Physical Exam Vitals reviewed.  HENT:     Head: Normocephalic and atraumatic.     Right Ear: Tympanic membrane and external ear normal.     Left Ear: Tympanic membrane, ear canal and external ear normal.     Ears:     Comments: Right EAC blocked with cerumen completely    Nose: Nose normal.  Eyes:     General: No scleral icterus.    Conjunctiva/sclera: Conjunctivae normal.  Cardiovascular:     Rate and Rhythm: Normal rate and regular rhythm.     Heart sounds: Normal heart sounds.  Pulmonary:     Effort: Pulmonary effort is normal.     Breath sounds: Normal breath sounds.  Musculoskeletal:     Right lower leg: No  edema.     Left lower leg: No edema.  Neurological:     General: No focal deficit present.     Mental Status: She is alert and oriented to person, place, and time.  Psychiatric:        Mood and Affect: Mood normal.        Behavior: Behavior normal.        Thought Content: Thought content normal.        Judgment: Judgment normal.       No results found for any visits on 01/04/20.  Assessment & Plan     1. Ceruminosis, right This  is cleared with irrigation and patient's hearing is back to normal for her.  2. Essential (primary) hypertension Good control.  3. Adult hypothyroidism Follow-up the next TSH when appropriate   No follow-ups  on file.      I, [name], MD, have reviewed all documentation for this visit. The documentation on 01/06/20 for the exam, diagnosis, procedures, and orders are all accurate and complete.    Richard Cranford Mon, MD  [facility] [phone] (phone) [phone] (fax)  [place]

## 2020-01-07 NOTE — Progress Notes (Signed)
Trena Platt Acxel Dingee,acting as a scribe for [name], MD.,have documented all relevant documentation on the behalf of [name], MD,as directed by  [name], MD while in the presence of [name], MD.  Established patient visit   Patient: Lynn Williams   DOB: [birth year]   86 y.o. Female  MRN: [id number] Visit Date: 01/11/2020  Today's healthcare provider: [name], MD   Chief Complaint  Patient presents with  . Hyperlipidemia  . Hypothyroidism   Subjective    HPI Patient is feeling well and has no complaints.  Her ears and her hearing are back to normal after having cerumen removed  last week.  She is taking her medication as has no issues with that Lipid/Cholesterol, follow-up  Last Lipid Panel: Lab Results  Component Value Date   CHOL 143 05/07/2019   LDLCALC 64 05/07/2019   HDL 65 05/07/2019   TRIG 72 05/07/2019    She was last seen for this 8 months ago.  Management since that visit includes; labs checked showing-okay. She reports excellent compliance with treatment. She is not having side effects.  She is following a Regular diet. Current exercise: goes to exercise class 4x a week   Last metabolic panel Lab Results  Component Value Date   GLUCOSE 87 05/07/2019   NA 133 (L) 05/07/2019   K 4.0 05/07/2019   BUN 12 05/07/2019   CREATININE 1.11 (H) 05/07/2019   GFRNONAA 46 (L) 05/07/2019   GFRAA 53 (L) 05/07/2019   CALCIUM 9.5 05/07/2019   AST 23 05/07/2019   ALT 11 05/07/2019   The ASCVD Risk score (Goff DC Jr., et al., 2013) failed to calculate for the following reasons:   The 2013 ASCVD risk score is only valid for ages 69 to 76  ---------------------------------------------------------------------------------------------------   Hypothyroid, follow-up  Lab Results  Component Value Date   TSH 6.000 (H) 05/07/2019   TSH 0.166 (L) 06/09/2018   TSH 6.810 (H) 12/05/2017   Wt Readings from Last 3  Encounters:  01/11/20 162 lb 12.8 oz (73.8 kg)  01/04/20 163 lb 6.4 oz (74.1 kg)  07/01/19 160 lb 12.8 oz (72.9 kg)    She was last seen for hypothyroid 8 months ago.  Management since that visit includes; labs checked showing-okay. She reports excellent compliance with treatment. She is not having side effects.   -----------------------------------------------------------------------------------------       Medications: Outpatient Medications Prior to Visit  Medication Sig  . Calcium-Magnesium-Vitamin D (CALCIUM 500 PO) Take by mouth. am  . cetirizine (ZYRTEC) 10 MG tablet Take by mouth as needed.   . hydrochlorothiazide (HYDRODIURIL) 25 MG tablet Take 1 tablet (25 mg total) by mouth daily.  Marland Kitchen levothyroxine (SYNTHROID) 50 MCG tablet TAKE 1 TABLET EVERY DAY  . losartan (COZAAR) 50 MG tablet TAKE 1 TABLET EVERY DAY  . Multiple Vitamins-Minerals (MULTIVITAMIN ADULT PO) Take by mouth. am  . pantoprazole (PROTONIX) 40 MG tablet TAKE 1 TABLET TWICE DAILY (Patient taking differently: Take 40 mg by mouth daily. )  . simvastatin (ZOCOR) 20 MG tablet TAKE 1 TABLET AT BEDTIME  . ALPRAZolam (XANAX) 0.5 MG tablet Take 1 tablet (0.5 mg total) by mouth at bedtime as needed for anxiety. (Patient not taking: Reported on 12/14/2019)  . doxycycline (VIBRA-TABS) 100 MG tablet Take 1 tablet (100 mg total) by mouth 2 (two) times daily. (Patient not taking: Reported on 12/14/2019)  . doxycycline (VIBRAMYCIN) 100 MG capsule Take 1 capsule (100 mg  total) by mouth 2 (two) times daily. (Patient not taking: Reported on 02/09/2019)   No facility-administered medications prior to visit.    Review of Systems  Constitutional: Negative for appetite change, chills, fatigue and fever.  HENT: Negative.   Eyes: Negative.   Respiratory: Negative for chest tightness and shortness of breath.   Cardiovascular: Negative for chest pain and palpitations.  Gastrointestinal: Negative for abdominal pain, nausea and vomiting.   Endocrine: Negative.   Allergic/Immunologic: Negative.   Neurological: Negative for dizziness and weakness.  Hematological: Negative.   Psychiatric/Behavioral: Negative.        Objective    BP (!) 144/77 (BP Location: Right Arm, Patient Position: Sitting, Cuff Size: Normal)   Pulse 69   Temp (!) 96.9 F (36.1 C) (Temporal)   Ht 5\' 2"  (1.575 m)   Wt 162 lb 12.8 oz (73.8 kg)   LMP  (LMP Unknown)   BMI 29.78 kg/m  BP Readings from Last 3 Encounters:  01/11/20 (!) 144/77  01/04/20 129/77  07/01/19 (!) 144/80   Wt Readings from Last 3 Encounters:  01/11/20 162 lb 12.8 oz (73.8 kg)  01/04/20 163 lb 6.4 oz (74.1 kg)  07/01/19 160 lb 12.8 oz (72.9 kg)      Physical Exam Vitals reviewed.  Constitutional:      Appearance: Normal appearance.  HENT:     Head: Normocephalic and atraumatic.     Right Ear: External ear normal.     Left Ear: External ear normal.  Eyes:     General: No scleral icterus.    Conjunctiva/sclera: Conjunctivae normal.  Cardiovascular:     Rate and Rhythm: Normal rate and regular rhythm.     Pulses: Normal pulses.     Heart sounds: Normal heart sounds.  Pulmonary:     Effort: Pulmonary effort is normal.     Breath sounds: Normal breath sounds.  Musculoskeletal:     Right lower leg: No edema.     Left lower leg: No edema.  Skin:    General: Skin is warm and dry.  Neurological:     General: No focal deficit present.     Mental Status: She is alert and oriented to person, place, and time.  Psychiatric:        Mood and Affect: Mood normal.        Behavior: Behavior normal.        Thought Content: Thought content normal.        Judgment: Judgment normal.       No results found for any visits on 01/11/20.  Assessment & Plan     1. Adult hypothyroidism Follow-up TSH and adjust dose .  Clinically patient is euthyroid. - CBC with Differential/Platelet - Comprehensive metabolic panel - TSH - Lipid panel  2. Hypercholesteremia  - CBC  with Differential/Platelet - Comprehensive metabolic panel - TSH - Lipid panel  3. Essential (primary) hypertension  - CBC with Differential/Platelet - Comprehensive metabolic panel - TSH - Lipid panel    No follow-ups on file.      I, [name], MD, have reviewed all documentation for this visit. The documentation on 01/12/20 for the exam, diagnosis, procedures, and orders are all accurate and complete.    Richard Cranford Mon, MD  [facility] [phone] (phone) [phone] (fax)  [place]

## 2020-01-11 ENCOUNTER — Ambulatory Visit (INDEPENDENT_AMBULATORY_CARE_PROVIDER_SITE_OTHER): Payer: Medicare HMO | Admitting: Family Medicine

## 2020-01-11 ENCOUNTER — Other Ambulatory Visit: Payer: Self-pay

## 2020-01-11 ENCOUNTER — Encounter: Payer: Self-pay | Admitting: Family Medicine

## 2020-01-11 VITALS — BP 144/77 | HR 69 | Temp 96.9°F | Ht 62.0 in | Wt 162.8 lb

## 2020-01-11 DIAGNOSIS — E7800 Pure hypercholesterolemia, unspecified: Secondary | ICD-10-CM | POA: Diagnosis not present

## 2020-01-11 DIAGNOSIS — I10 Essential (primary) hypertension: Secondary | ICD-10-CM

## 2020-01-11 DIAGNOSIS — E039 Hypothyroidism, unspecified: Secondary | ICD-10-CM

## 2020-01-12 LAB — COMPREHENSIVE METABOLIC PANEL
ALT: 17 IU/L (ref 0–32)
AST: 30 IU/L (ref 0–40)
Albumin/Globulin Ratio: 1.8 (ref 1.2–2.2)
Albumin: 4.2 g/dL (ref 3.6–4.6)
Alkaline Phosphatase: 58 IU/L (ref 48–121)
BUN/Creatinine Ratio: 20 (ref 12–28)
BUN: 19 mg/dL (ref 8–27)
Bilirubin Total: 0.3 mg/dL (ref 0.0–1.2)
CO2: 26 mmol/L (ref 20–29)
Calcium: 9.5 mg/dL (ref 8.7–10.3)
Chloride: 98 mmol/L (ref 96–106)
Creatinine, Ser: 0.96 mg/dL (ref 0.57–1.00)
GFR calc Af Amer: 62 mL/min/{1.73_m2} (ref >59)
GFR calc non Af Amer: 54 mL/min/{1.73_m2} — ABNORMAL LOW (ref >59)
Globulin, Total: 2.4 g/dL (ref 1.5–4.5)
Glucose: 91 mg/dL (ref 65–99)
Potassium: 4 mmol/L (ref 3.5–5.2)
Sodium: 136 mmol/L (ref 134–144)
Total Protein: 6.6 g/dL (ref 6.0–8.5)

## 2020-01-12 LAB — LIPID PANEL
Chol/HDL Ratio: 2.2 ratio (ref 0.0–4.4)
Cholesterol, Total: 132 mg/dL (ref 100–199)
HDL: 60 mg/dL (ref >39)
LDL Chol Calc (NIH): 61 mg/dL (ref 0–99)
Triglycerides: 50 mg/dL (ref 0–149)
VLDL Cholesterol Cal: 11 mg/dL (ref 5–40)

## 2020-01-12 LAB — CBC WITH DIFFERENTIAL/PLATELET
Basophils Absolute: 0.1 10*3/uL (ref 0.0–0.2)
Basos: 1 %
EOS (ABSOLUTE): 0.1 10*3/uL (ref 0.0–0.4)
Eos: 2 %
Hematocrit: 35.7 % (ref 34.0–46.6)
Hemoglobin: 11.7 g/dL (ref 11.1–15.9)
Immature Grans (Abs): 0 10*3/uL (ref 0.0–0.1)
Immature Granulocytes: 0 %
Lymphocytes Absolute: 1.4 10*3/uL (ref 0.7–3.1)
Lymphs: 27 %
MCH: 29.5 pg (ref 26.6–33.0)
MCHC: 32.8 g/dL (ref 31.5–35.7)
MCV: 90 fL (ref 79–97)
Monocytes Absolute: 0.5 10*3/uL (ref 0.1–0.9)
Monocytes: 9 %
Neutrophils Absolute: 3 10*3/uL (ref 1.4–7.0)
Neutrophils: 61 %
Platelets: 260 10*3/uL (ref 150–450)
RBC: 3.96 x10E6/uL (ref 3.77–5.28)
RDW: 12.6 % (ref 11.7–15.4)
WBC: 5 10*3/uL (ref 3.4–10.8)

## 2020-01-12 LAB — TSH: TSH: 5.51 u[IU]/mL — ABNORMAL HIGH (ref 0.450–4.500)

## 2020-01-13 ENCOUNTER — Telehealth: Payer: Self-pay

## 2020-01-13 ENCOUNTER — Other Ambulatory Visit: Payer: Self-pay | Admitting: Family Medicine

## 2020-01-13 DIAGNOSIS — R1013 Epigastric pain: Secondary | ICD-10-CM

## 2020-01-13 NOTE — Telephone Encounter (Signed)
Requested  medications are  due for refill today yes  Requested medications are on the active medication list yes  Last refill 4/28 (mail order)  Last visit 01/11/20  Future visit scheduled yes  Notes to clinic This Rx request is for current dose on med list but it states the patient is only taking daily. Did not know if you would want to change Rx to daily like she states she takes it or leave it as is?

## 2020-01-13 NOTE — Telephone Encounter (Signed)
-----   Message from Jerrol Banana., MD sent at 01/12/2020  9:05 AM EDT ----- Labs stable.

## 2020-01-13 NOTE — Telephone Encounter (Signed)
Patient advised of lab results

## 2020-02-22 ENCOUNTER — Other Ambulatory Visit: Payer: Self-pay

## 2020-02-22 ENCOUNTER — Other Ambulatory Visit: Payer: Self-pay | Admitting: Family Medicine

## 2020-02-22 DIAGNOSIS — Z1231 Encounter for screening mammogram for malignant neoplasm of breast: Secondary | ICD-10-CM

## 2020-02-22 DIAGNOSIS — E2839 Other primary ovarian failure: Secondary | ICD-10-CM

## 2020-03-26 ENCOUNTER — Other Ambulatory Visit: Payer: Self-pay | Admitting: Family Medicine

## 2020-04-04 ENCOUNTER — Ambulatory Visit
Admission: RE | Admit: 2020-04-04 | Discharge: 2020-04-04 | Disposition: A | Discharge status: Home / Self Care | Payer: Medicare HMO | Source: Ambulatory Visit | Attending: Family Medicine | Admitting: Family Medicine

## 2020-04-04 ENCOUNTER — Other Ambulatory Visit: Payer: Self-pay

## 2020-04-04 DIAGNOSIS — E2839 Other primary ovarian failure: Secondary | ICD-10-CM | POA: Insufficient documentation

## 2020-04-04 DIAGNOSIS — Z1231 Encounter for screening mammogram for malignant neoplasm of breast: Secondary | ICD-10-CM | POA: Insufficient documentation

## 2020-04-04 DIAGNOSIS — Z780 Asymptomatic menopausal state: Secondary | ICD-10-CM | POA: Diagnosis not present

## 2020-04-04 DIAGNOSIS — R29890 Loss of height: Secondary | ICD-10-CM | POA: Diagnosis not present

## 2020-04-08 DIAGNOSIS — H43813 Vitreous degeneration, bilateral: Secondary | ICD-10-CM | POA: Diagnosis not present

## 2020-04-12 ENCOUNTER — Telehealth: Payer: Self-pay

## 2020-04-12 NOTE — Telephone Encounter (Unsigned)
Copied from [place] [id number]. Topic: General - Inquiry >> Apr 12, 2020  2:10 PM Lennox Solders wrote: Reason for CRM: pt is calling and would like results of her mammogram and bone density test she had a [facility] on 04-04-2020

## 2020-04-12 NOTE — Telephone Encounter (Signed)
-----   Message from Jerrol Banana., MD sent at 04/12/2020  8:20 AM EDT ----- If patient has not taken Fosamax/alendronate in the last 5 years would use alendronate 70 mg every week. #4 with 12 refills.

## 2020-04-12 NOTE — Telephone Encounter (Signed)
Patient advised of bone density results, also advised patient that we will give her a call back when we receive mammogram results.

## 2020-04-12 NOTE — Telephone Encounter (Signed)
Patient advised and would like to continue taking calcium supplement and discuss Fosamax when she comes in November.

## 2020-05-04 ENCOUNTER — Other Ambulatory Visit: Payer: Self-pay | Admitting: Family Medicine

## 2020-05-04 DIAGNOSIS — I10 Essential (primary) hypertension: Secondary | ICD-10-CM

## 2020-05-30 ENCOUNTER — Other Ambulatory Visit: Payer: Self-pay | Admitting: Family Medicine

## 2020-05-30 DIAGNOSIS — I10 Essential (primary) hypertension: Secondary | ICD-10-CM

## 2020-05-30 NOTE — Progress Notes (Signed)
I,[name],acting as a scribe for [name], MD.,have documented all relevant documentation on the behalf of [name], MD,as directed by  [name], MD while in the presence of [name], MD.   Complete physical exam   Patient: Lynn Williams   DOB: 1932-06-22   86 y.o. Female  MRN: [id number] Visit Date: 05/31/2020  Today's healthcare provider: [name], MD   Chief Complaint  Patient presents with  . Annual Exam   Subjective    Lynn Williams is a 86 y.o. female who presents today for a complete physical exam.  She reports consuming a general diet. Home exercise routine includes walking. She generally feels well. She reports sleeping well. She does not have additional problems to discuss today.  HPI  Patient had AWV with [facility] 12/14/2019.  Past Medical History:  Diagnosis Date  . Arthritis    hands and wrist and knees  . Cough    chronic/allergies  . GERD (gastroesophageal reflux disease)   . Hyperlipidemia   . Hypertension    controlled on meds  . Hypothyroidism   . Wears partial dentures    upper   Past Surgical History:  Procedure Laterality Date  . BREAST CYST ASPIRATION    . BREAST EXCISIONAL BIOPSY Right 1978   benign  . CATARACT EXTRACTION Bilateral   . ESOPHAGOGASTRODUODENOSCOPY (EGD) WITH PROPOFOL N/A 01/16/2016   Procedure: ESOPHAGOGASTRODUODENOSCOPY (EGD) WITH PROPOFOL;  Surgeon: Lucilla Lame, MD;  Location: [place];  Service: Endoscopy;  Laterality: N/A;  . TUBAL LIGATION     Social History   Socioeconomic History  . Marital status: Married    Spouse name: Not on file  . Number of children: 0  . Years of education: Not on file  . Highest education level: GED or equivalent  Occupational History  . Occupation: retired  Tobacco Use  . Smoking status: Never Smoker  . Smokeless tobacco: Never Used  Vaping Use  . Vaping Use: Never used  Substance and Sexual Activity  . Alcohol  use: No    Alcohol/week: 0.0 standard drinks  . Drug use: No  . Sexual activity: Not on file  Other Topics Concern  . Not on file  Social History Narrative  . Not on file   Social Determinants of Health   Financial Resource Strain: Low Risk   . Difficulty of Paying Living Expenses: Not hard at all  Food Insecurity: No Food Insecurity  . Worried About Charity fundraiser in the Last Year: Never true  . Ran Out of Food in the Last Year: Never true  Transportation Needs: No Transportation Needs  . Lack of Transportation (Medical): No  . Lack of Transportation (Non-Medical): No  Physical Activity: Sufficiently Active  . Days of Exercise per Week: 4 days  . Minutes of Exercise per Session: 60 min  Stress: No Stress Concern Present  . Feeling of Stress : Not at all  Social Connections: Moderately Integrated  . Frequency of Communication with Friends and Family: More than three times a week  . Frequency of Social Gatherings with Friends and Family: More than three times a week  . Attends Religious Services: More than 4 times per year  . Active Member of Clubs or Organizations: No  . Attends Archivist Meetings: Never  . Marital Status: Married  Human resources officer Violence: Not At Risk  . Fear of Current or Ex-Partner: No  . Emotionally Abused: No  .  Physically Abused: No  . Sexually Abused: No   Family Status  Relation Name Status  . Mother  Deceased  . Father  Deceased  . Brother  Deceased  . Sister  Deceased  . Brother  Deceased  . Sister  Alive  . Sister  (Not Specified)  . Sister  (Not Specified)  . Brother  (Not Specified)  . Brother  (Not Specified)  . Brother  (Not Specified)  . Neg Hx  (Not Specified)   Family History  Problem Relation Age of Onset  . Hypertension Mother   . Heart attack Mother   . Stroke Mother   . Prostate cancer Father   . Heart attack Brother   . Stroke Sister   . Heart attack Sister   . Coronary artery disease Sister   .  Alzheimer's disease Sister   . Heart attack Brother   . COPD Brother   . Heart attack Sister   . Coronary artery disease Sister   . Diabetes Sister   . Hypertension Sister   . Heart disease Sister   . Heart disease Brother   . Heart disease Brother   . COPD Brother   . Breast cancer Neg Hx    Allergies  Allergen Reactions  . Sulfa Antibiotics Itching    Patient Care Team: Jerrol Banana., MD as PCP - General (Family Medicine) Birder Robson, MD as Referring Physician (Ophthalmology)   Medications: Outpatient Medications Prior to Visit  Medication Sig  . Calcium-Magnesium-Vitamin D (CALCIUM 500 PO) Take by mouth. am  . cetirizine (ZYRTEC) 10 MG tablet Take by mouth as needed.   . hydrochlorothiazide (HYDRODIURIL) 25 MG tablet TAKE 1 TABLET EVERY DAY  . levothyroxine (SYNTHROID) 50 MCG tablet TAKE 1 TABLET EVERY DAY  . losartan (COZAAR) 50 MG tablet TAKE 1 TABLET EVERY DAY  . Multiple Vitamins-Minerals (MULTIVITAMIN ADULT PO) Take by mouth. am  . pantoprazole (PROTONIX) 40 MG tablet TAKE 1 TABLET TWICE DAILY  . simvastatin (ZOCOR) 20 MG tablet TAKE 1 TABLET AT BEDTIME  . ALPRAZolam (XANAX) 0.5 MG tablet Take 1 tablet (0.5 mg total) by mouth at bedtime as needed for anxiety. (Patient not taking: Reported on 12/14/2019)  . doxycycline (VIBRA-TABS) 100 MG tablet Take 1 tablet (100 mg total) by mouth 2 (two) times daily. (Patient not taking: Reported on 12/14/2019)  . doxycycline (VIBRAMYCIN) 100 MG capsule Take 1 capsule (100 mg total) by mouth 2 (two) times daily. (Patient not taking: Reported on 02/09/2019)   No facility-administered medications prior to visit.    Review of Systems  Respiratory: Positive for cough.   Musculoskeletal: Positive for back pain.  All other systems reviewed and are negative.      Objective    BP (!) 151/64 (BP Location: Left Arm, Patient Position: Sitting, Cuff Size: Normal)   Pulse 60   Temp 98 F (36.7 C) (Oral)   Resp 16   Ht 5'  2" (1.575 m)   Wt 163 lb (73.9 kg)   LMP  (LMP Unknown)   SpO2 98%   BMI 29.81 kg/m  BP Readings from Last 3 Encounters:  05/31/20 (!) 151/64  01/11/20 (!) 144/77  01/04/20 129/77   Wt Readings from Last 3 Encounters:  05/31/20 163 lb (73.9 kg)  01/11/20 162 lb 12.8 oz (73.8 kg)  01/04/20 163 lb 6.4 oz (74.1 kg)      Physical Exam Vitals reviewed.  Constitutional:      Appearance: She is well-developed.  HENT:  Head: Normocephalic and atraumatic.     Right Ear: External ear normal.     Left Ear: External ear normal.     Nose: Nose normal.  Eyes:     Conjunctiva/sclera: Conjunctivae normal.     Pupils: Pupils are equal, round, and reactive to light.  Cardiovascular:     Rate and Rhythm: Normal rate and regular rhythm.     Heart sounds: Normal heart sounds.  Pulmonary:     Effort: Pulmonary effort is normal.     Breath sounds: Normal breath sounds.  Chest:     Breasts: Breasts are symmetrical.        Right: Normal. No swelling, bleeding, inverted nipple, mass, nipple discharge, skin change or tenderness.        Left: Normal. No swelling, bleeding, inverted nipple, mass, nipple discharge, skin change or tenderness.  Abdominal:     General: Bowel sounds are normal.     Palpations: Abdomen is soft.  Genitourinary:    Comments: Breast exam without mass. Musculoskeletal:        General: Normal range of motion.     Cervical back: Normal range of motion and neck supple.  Skin:    General: Skin is warm and dry.  Neurological:     General: No focal deficit present.     Mental Status: She is alert and oriented to person, place, and time.  Psychiatric:        Mood and Affect: Mood normal.        Behavior: Behavior normal.        Thought Content: Thought content normal.        Judgment: Judgment normal.       Last depression screening scores PHQ 2/9 Scores 05/31/2020 12/14/2019 12/10/2018  PHQ - 2 Score 0 0 0  PHQ- 9 Score 0 - 2   Last fall risk screening Fall  Risk  05/31/2020  Falls in the past year? 0  Number falls in past yr: 0  Injury with Fall? 0  Follow up Falls evaluation completed   Last Audit-C alcohol use screening Alcohol Use Disorder Test (AUDIT) 05/31/2020  1. How often do you have a drink containing alcohol? 0  2. How many drinks containing alcohol do you have on a typical day when you are drinking? 0  3. How often do you have six or more drinks on one occasion? 0  AUDIT-C Score 0  Alcohol Brief Interventions/Follow-up AUDIT Score <7 follow-up not indicated   A score of 3 or more in women, and 4 or more in men indicates increased risk for alcohol abuse, EXCEPT if all of the points are from question 1   No results found for any visits on 05/31/20.  Assessment & Plan    Routine Health Maintenance and Physical Exam  Exercise Activities and Dietary recommendations Goals    . DIET - REDUCE SUGAR INTAKE     Recommend cutting out all sugar in diet to help aid in weight loss and decrease BMI.    . Increase water intake     Starting 08/28/16, I will increase my water intake to 5 glasses a day.       Immunization History  Administered Date(s) Administered  . Fluad Quad(high Dose 65+) 05/07/2019  . Influenza, High Dose Seasonal PF 05/04/2015, 05/14/2016, 05/08/2017, 04/03/2018  . PFIZER SARS-COV-2 Vaccination 08/18/2019, 09/08/2019  . Pneumococcal Conjugate-13 12/23/2013  . Pneumococcal Polysaccharide-23 07/24/1996, 08/04/2003    Health Maintenance  Topic Date Due  . INFLUENZA  VACCINE  02/21/2020  . DEXA SCAN  04/04/2022  . TETANUS/TDAP  12/24/2023  . COVID-19 Vaccine  Completed  . PNA vac Low Risk Adult  Completed    Discussed health benefits of physical activity, and encouraged her to engage in regular exercise appropriate for her age and condition.  1. Annual physical exam   2. Osteoporosis, unspecified osteoporosis type, unspecified pathological fracture presence Started alendronate 70 mg once weekly. -  alendronate (FOSAMAX) 70 MG tablet; Take 1 tablet (70 mg total) by mouth once a week. Take with a full glass of water on an empty stomach.  Dispense: 12 tablet; Refill: 4   Return in about 6 months (around 11/28/2020).        Kendi Defalco Cranford Mon, MD  [facility] [phone] (phone) [phone] (fax)  [place]

## 2020-05-30 NOTE — Telephone Encounter (Signed)
Future visit tomorrow 

## 2020-05-31 ENCOUNTER — Ambulatory Visit (INDEPENDENT_AMBULATORY_CARE_PROVIDER_SITE_OTHER): Payer: Medicare HMO | Admitting: Family Medicine

## 2020-05-31 ENCOUNTER — Encounter: Payer: Self-pay | Admitting: Family Medicine

## 2020-05-31 ENCOUNTER — Other Ambulatory Visit: Payer: Self-pay

## 2020-05-31 VITALS — BP 151/64 | HR 60 | Temp 98.0°F | Resp 16 | Ht 62.0 in | Wt 163.0 lb

## 2020-05-31 DIAGNOSIS — Z0000 Encounter for general adult medical examination without abnormal findings: Secondary | ICD-10-CM

## 2020-05-31 DIAGNOSIS — M810 Age-related osteoporosis without current pathological fracture: Secondary | ICD-10-CM

## 2020-05-31 MED ORDER — ALENDRONATE SODIUM 70 MG PO TABS: 70.0000 mg | ORAL_TABLET | ORAL | 4 refills | Status: DC

## 2020-06-02 ENCOUNTER — Other Ambulatory Visit: Payer: Self-pay

## 2020-06-02 ENCOUNTER — Ambulatory Visit (INDEPENDENT_AMBULATORY_CARE_PROVIDER_SITE_OTHER): Payer: Medicare HMO

## 2020-06-02 DIAGNOSIS — Z23 Encounter for immunization: Secondary | ICD-10-CM

## 2020-06-14 ENCOUNTER — Encounter: Payer: Self-pay | Admitting: Family Medicine

## 2020-06-15 ENCOUNTER — Other Ambulatory Visit: Payer: Self-pay

## 2020-06-15 ENCOUNTER — Ambulatory Visit (INDEPENDENT_AMBULATORY_CARE_PROVIDER_SITE_OTHER): Payer: Medicare HMO | Admitting: Adult Health

## 2020-06-15 DIAGNOSIS — Z23 Encounter for immunization: Secondary | ICD-10-CM

## 2020-07-11 ENCOUNTER — Other Ambulatory Visit: Payer: Self-pay | Admitting: Family Medicine

## 2020-07-11 DIAGNOSIS — I10 Essential (primary) hypertension: Secondary | ICD-10-CM

## 2020-08-09 ENCOUNTER — Other Ambulatory Visit: Payer: Self-pay | Admitting: Family Medicine

## 2020-11-25 NOTE — Progress Notes (Signed)
I,[name],acting as a scribe for Lynn Durie, MD.,have documented all relevant documentation on the behalf of Lynn Durie, MD,as directed by  Lynn Durie, MD while in the presence of Lynn Durie, MD.   Established patient visit   Patient: Lynn Williams   DOB: 07-19-1932   87 y.o. Female  MRN: [id number] Visit Date: 11/28/2020  Today's healthcare provider: [name], MD   Chief Complaint  Patient presents with  . Follow-up  . Hypertension  . Hyperlipidemia  . Osteoporosis   Subjective    HPI  Patient feels well and has mild  complaints is more some mild bloating and left lower quadrant comfort and  constipation. Hypertension, follow-up  BP Readings from Last 3 Encounters:  11/28/20 (!) 163/84  05/31/20 (!) 151/64  01/11/20 (!) 144/77   Wt Readings from Last 3 Encounters:  11/28/20 166 lb (75.3 kg)  05/31/20 163 lb (73.9 kg)  01/11/20 162 lb 12.8 oz (73.8 kg)     She was last seen for hypertension 11 months ago.  BP at that visit was 144/77. Management since that visit includes; on losartan. She reports good compliance with treatment. She is not having side effects. none She is exercising. She is adherent to low salt diet.   Outside blood pressures are 130/70.  She does not smoke.  Use of agents associated with hypertension: none.   --------------------------------------------------------------------  Lipid/Cholesterol, follow-up  Last Lipid Panel: Lab Results  Component Value Date   CHOL 132 01/11/2020   LDLCALC 61 01/11/2020   HDL 60 01/11/2020   TRIG 50 01/11/2020    She was last seen for this 11 months ago.  Management since that visit includes; on simvastatin.  She reports good compliance with treatment. She is not having side effects. none She is following a Regular diet. Current exercise: exercise class 4x a week  Last metabolic panel Lab Results  Component Value Date   GLUCOSE 91  01/11/2020   NA 136 01/11/2020   K 4.0 01/11/2020   BUN 19 01/11/2020   CREATININE 0.96 01/11/2020   GFRNONAA 54 (L) 01/11/2020   GFRAA 62 01/11/2020   CALCIUM 9.5 01/11/2020   AST 30 01/11/2020   ALT 17 01/11/2020   The ASCVD Risk score Mikey Bussing DC Jr., et al., 2013) failed to calculate for the following reasons:   The 2013 ASCVD risk score is only valid for ages 33 to 91  --------------------------------------------------------------------  Follow up for Osteoporosis  The patient was last seen for this 6 months ago. Changes made at last visit include; Started alendronate 70 mg once weekly.  She reports good compliance with treatment. She feels that condition is Unchanged. She is not having side effects. none  --------------------------------------------------------------------  Patient states she is doing well on Fosamax.       Medications: Outpatient Medications Prior to Visit  Medication Sig  . alendronate (FOSAMAX) 70 MG tablet Take 1 tablet (70 mg total) by mouth once a week. Take with a full glass of water on an empty stomach.  . Calcium-Magnesium-Vitamin D (CALCIUM 500 PO) Take by mouth. am  . cetirizine (ZYRTEC) 10 MG tablet Take by mouth as needed.   . hydrochlorothiazide (HYDRODIURIL) 25 MG tablet TAKE 1 TABLET EVERY DAY  . levothyroxine (SYNTHROID) 50 MCG tablet TAKE 1 TABLET EVERY DAY  . Multiple Vitamins-Minerals (MULTIVITAMIN ADULT PO) Take by mouth. am  . simvastatin (ZOCOR) 20 MG tablet TAKE 1 TABLET AT BEDTIME  . [  DISCONTINUED] losartan (COZAAR) 50 MG tablet TAKE 1 TABLET EVERY DAY  . [DISCONTINUED] pantoprazole (PROTONIX) 40 MG tablet TAKE 1 TABLET TWICE DAILY  . ALPRAZolam (XANAX) 0.5 MG tablet Take 1 tablet (0.5 mg total) by mouth at bedtime as needed for anxiety. (Patient not taking: No sig reported)  . doxycycline (VIBRA-TABS) 100 MG tablet Take 1 tablet (100 mg total) by mouth 2 (two) times daily. (Patient not taking: No sig reported)  . doxycycline  (VIBRAMYCIN) 100 MG capsule Take 1 capsule (100 mg total) by mouth 2 (two) times daily. (Patient not taking: No sig reported)   No facility-administered medications prior to visit.    Review of Systems  Constitutional: Negative for appetite change, chills, fatigue and fever.  Respiratory: Negative for chest tightness and shortness of breath.   Cardiovascular: Negative for chest pain and palpitations.  Gastrointestinal: Negative for abdominal pain, nausea and vomiting.  Neurological: Negative for dizziness and weakness.        Objective    BP (!) 163/84 (BP Location: Right Arm, Patient Position: Sitting, Cuff Size: Normal)   Pulse 65   Resp 16   Ht 5\' 2"  (1.575 m)   Wt 166 lb (75.3 kg)   LMP  (LMP Unknown)   SpO2 99%   BMI 30.36 kg/m  BP Readings from Last 3 Encounters:  11/28/20 (!) 163/84  05/31/20 (!) 151/64  01/11/20 (!) 144/77   Wt Readings from Last 3 Encounters:  11/28/20 166 lb (75.3 kg)  05/31/20 163 lb (73.9 kg)  01/11/20 162 lb 12.8 oz (73.8 kg)       Physical Exam Vitals reviewed.  Constitutional:      Appearance: Normal appearance.  HENT:     Head: Normocephalic and atraumatic.     Right Ear: External ear normal.     Left Ear: External ear normal.  Eyes:     General: No scleral icterus.    Conjunctiva/sclera: Conjunctivae normal.  Cardiovascular:     Rate and Rhythm: Normal rate and regular rhythm.     Pulses: Normal pulses.     Heart sounds: Normal heart sounds.  Pulmonary:     Effort: Pulmonary effort is normal.     Breath sounds: Normal breath sounds.  Musculoskeletal:     Right lower leg: No edema.     Left lower leg: No edema.  Skin:    General: Skin is warm and dry.  Neurological:     General: No focal deficit present.     Mental Status: She is alert and oriented to person, place, and time.  Psychiatric:        Mood and Affect: Mood normal.        Behavior: Behavior normal.        Thought Content: Thought content normal.         Judgment: Judgment normal.       Results for orders placed or performed in visit on 11/28/20  POCT urinalysis dipstick  Result Value Ref Range   Color, UA Yellow    Clarity, UA Clear    Glucose, UA Negative Negative   Bilirubin, UA Negative    Ketones, UA Negative    Spec Grav, UA 1.010 1.010 - 1.025   Blood, UA Negative    pH, UA 7.5 5.0 - 8.0   Protein, UA Negative Negative   Urobilinogen, UA 0.2 0.2 or 1.0 E.U./dL   Nitrite, UA Negative    Leukocytes, UA Negative Negative    Assessment & Plan  1. Essential (primary) hypertension Increase losartan from 50 to 100 mg daily.  Follow-up in a couple of months. - losartan (COZAAR) 100 MG tablet; Take 1 tablet (100 mg total) by mouth daily.  Dispense: 90 tablet; Refill: 0  2. LLQ pain Tried Metamucil and probiotic daily.  Follow-up in a couple of months. - POCT urinalysis dipstick - CULTURE, URINE COMPREHENSIVE  3. Epigastric pain  - pantoprazole (PROTONIX) 40 MG tablet; Take 1 tablet (40 mg total) by mouth 2 (two) times daily.  Dispense: 90 tablet; Refill: 3 4.  Hypothyroidism   5.  Hyperlipidemia Simvastatin Return in about 2 months (around 01/28/2021).         Alaijah Gibler Cranford Mon, MD  [facility] [phone] (phone) [phone] (fax)  [place]

## 2020-11-28 ENCOUNTER — Ambulatory Visit (INDEPENDENT_AMBULATORY_CARE_PROVIDER_SITE_OTHER): Payer: Medicare HMO | Admitting: Family Medicine

## 2020-11-28 ENCOUNTER — Encounter: Payer: Self-pay | Admitting: Family Medicine

## 2020-11-28 ENCOUNTER — Other Ambulatory Visit: Payer: Self-pay

## 2020-11-28 VITALS — BP 163/84 | HR 65 | Resp 16 | Ht 62.0 in | Wt 166.0 lb

## 2020-11-28 DIAGNOSIS — R1032 Left lower quadrant pain: Secondary | ICD-10-CM | POA: Diagnosis not present

## 2020-11-28 DIAGNOSIS — I10 Essential (primary) hypertension: Secondary | ICD-10-CM | POA: Diagnosis not present

## 2020-11-28 DIAGNOSIS — R1013 Epigastric pain: Secondary | ICD-10-CM

## 2020-11-28 DIAGNOSIS — E7800 Pure hypercholesterolemia, unspecified: Secondary | ICD-10-CM

## 2020-11-28 DIAGNOSIS — E039 Hypothyroidism, unspecified: Secondary | ICD-10-CM

## 2020-11-28 LAB — POCT URINALYSIS DIPSTICK
Bilirubin, UA: NEGATIVE
Blood, UA: NEGATIVE
Glucose, UA: NEGATIVE
Ketones, UA: NEGATIVE
Leukocytes, UA: NEGATIVE
Nitrite, UA: NEGATIVE
Protein, UA: NEGATIVE
Spec Grav, UA: 1.01 (ref 1.010–1.025)
Urobilinogen, UA: 0.2 E.U./dL
pH, UA: 7.5 (ref 5.0–8.0)

## 2020-11-28 MED ORDER — LOSARTAN POTASSIUM 100 MG PO TABS: 100.0000 mg | ORAL_TABLET | Freq: Every day | ORAL | 0 refills | Status: DC

## 2020-11-28 MED ORDER — PANTOPRAZOLE SODIUM 40 MG PO TBEC: 40.0000 mg | DELAYED_RELEASE_TABLET | Freq: Two times a day (BID) | ORAL | 3 refills | Status: DC

## 2020-11-28 NOTE — Patient Instructions (Signed)
Try over-the-counter Metamucil and a Probiotic daily.

## 2020-12-02 LAB — CULTURE, URINE COMPREHENSIVE

## 2021-01-12 ENCOUNTER — Other Ambulatory Visit: Payer: Self-pay | Admitting: Family Medicine

## 2021-01-12 DIAGNOSIS — I10 Essential (primary) hypertension: Secondary | ICD-10-CM

## 2021-01-12 NOTE — Telephone Encounter (Signed)
Notes to clinic: Patient has upcoming appt on 02/01/2021 Patient is due for labs    Requested Prescriptions  Pending Prescriptions Disp Refills   hydrochlorothiazide (HYDRODIURIL) 25 MG tablet [Pharmacy Med Name: HYDROCHLOROTHIAZIDE 25 MG Tablet] 90 tablet 0    Sig: TAKE 1 TABLET EVERY DAY      Cardiovascular: Diuretics - Thiazide Failed - 01/12/2021 11:22 AM      Failed - Ca in normal range and within 360 days    Calcium  Date Value Ref Range Status  01/11/2020 9.5 8.7 - 10.3 mg/dL Final          Failed - Cr in normal range and within 360 days    Creatinine, Ser  Date Value Ref Range Status  01/11/2020 0.96 0.57 - 1.00 mg/dL Final          Failed - K in normal range and within 360 days    Potassium  Date Value Ref Range Status  01/11/2020 4.0 3.5 - 5.2 mmol/L Final  08/17/2014 3.2 (L) 3.5 - 5.1 mmol/L Final          Failed - Na in normal range and within 360 days    Sodium  Date Value Ref Range Status  01/11/2020 136 134 - 144 mmol/L Final          Failed - Last BP in normal range    BP Readings from Last 1 Encounters:  11/28/20 (!) 163/84          Passed - Valid encounter within last 6 months    Recent Outpatient Visits           1 month ago Essential (primary) hypertension   [place] Jerrol Banana., MD   7 months ago Annual physical exam   [facility] Jerrol Banana., MD   1 year ago Adult hypothyroidism   [facility] Jerrol Banana., MD   1 year ago Ceruminosis, right   [facility] Jerrol Banana., MD   1 year ago Flank pain   [facility] Jerrol Banana., MD       Future Appointments             In 2 weeks Jerrol Banana., MD [facility]               levothyroxine (SYNTHROID) 50 MCG tablet [Pharmacy Med Name: LEVOTHYROXINE SODIUM 50 MCG Tablet] 90 tablet 2    Sig: TAKE 1 TABLET EVERY DAY       Endocrinology:  Hypothyroid Agents Failed - 01/12/2021 11:22 AM      Failed - TSH needs to be rechecked within 3 months after an abnormal result. Refill until TSH is due.      Failed - TSH in normal range and within 360 days    TSH  Date Value Ref Range Status  01/11/2020 5.510 (H) 0.450 - 4.500 uIU/mL Final          Passed - Valid encounter within last 12 months    Recent Outpatient Visits           1 month ago Essential (primary) hypertension   [facility] Jerrol Banana., MD   7 months ago Annual physical exam   [facility] Jerrol Banana., MD   1 year ago Adult hypothyroidism   [facility] Jerrol Banana., MD   1 year ago Ceruminosis, right  [facility] Jerrol Banana., MD   1 year ago Flank pain   [facility] Jerrol Banana., MD       Future Appointments             In 2 weeks Jerrol Banana., MD [facility], [place]

## 2021-01-31 ENCOUNTER — Other Ambulatory Visit: Payer: Self-pay | Admitting: Family Medicine

## 2021-01-31 DIAGNOSIS — I10 Essential (primary) hypertension: Secondary | ICD-10-CM

## 2021-02-01 ENCOUNTER — Other Ambulatory Visit: Payer: Self-pay

## 2021-02-01 ENCOUNTER — Ambulatory Visit (INDEPENDENT_AMBULATORY_CARE_PROVIDER_SITE_OTHER): Payer: Medicare HMO | Admitting: Family Medicine

## 2021-02-01 ENCOUNTER — Encounter: Payer: Self-pay | Admitting: Family Medicine

## 2021-02-01 VITALS — BP 163/69 | HR 70 | Temp 98.1°F | Resp 16 | Wt 165.6 lb

## 2021-02-01 DIAGNOSIS — E039 Hypothyroidism, unspecified: Secondary | ICD-10-CM | POA: Diagnosis not present

## 2021-02-01 DIAGNOSIS — E7800 Pure hypercholesterolemia, unspecified: Secondary | ICD-10-CM | POA: Diagnosis not present

## 2021-02-01 DIAGNOSIS — I10 Essential (primary) hypertension: Secondary | ICD-10-CM | POA: Diagnosis not present

## 2021-02-01 NOTE — Telephone Encounter (Signed)
   Notes to clinic:  Patient has appt today    Requested Prescriptions  Pending Prescriptions Disp Refills   losartan (COZAAR) 100 MG tablet [Pharmacy Med Name: LOSARTAN POTASSIUM 100 MG Tablet] 90 tablet 0    Sig: TAKE 1 TABLET EVERY DAY      Cardiovascular:  Angiotensin Receptor Blockers Failed - 01/31/2021 10:03 PM      Failed - Cr in normal range and within 180 days    Creatinine, Ser  Date Value Ref Range Status  01/11/2020 0.96 0.57 - 1.00 mg/dL Final          Failed - K in normal range and within 180 days    Potassium  Date Value Ref Range Status  01/11/2020 4.0 3.5 - 5.2 mmol/L Final  08/17/2014 3.2 (L) 3.5 - 5.1 mmol/L Final          Failed - Last BP in normal range    BP Readings from Last 1 Encounters:  11/28/20 (!) 163/84          Passed - Patient is not pregnant      Passed - Valid encounter within last 6 months    Recent Outpatient Visits           2 months ago Essential (primary) hypertension   [place] Jerrol Banana., MD   8 months ago Annual physical exam   [facility] Jerrol Banana., MD   1 year ago Adult hypothyroidism   [facility] Jerrol Banana., MD   1 year ago Ceruminosis, right   [facility] Jerrol Banana., MD   1 year ago Flank pain   [facility] Jerrol Banana., MD       Future Appointments             Today Jerrol Banana., MD [facility], [place]

## 2021-02-01 NOTE — Progress Notes (Signed)
Established patient visit   Patient: Lynn Williams   DOB: 1932/06/07   87 y.o. Female  MRN: [id number] Visit Date: 02/01/2021  Today's healthcare provider: [name], MD   Chief Complaint  Patient presents with   Hypertension   Hyperlipidemia   Subjective    HPI  Patient comes in today for follow-up.  She feels well. She has h/o white coat HTN.  Hypertension, follow-up  BP Readings from Last 3 Encounters:  02/01/21 (!) 163/69  11/28/20 (!) 163/84  05/31/20 (!) 151/64   Wt Readings from Last 3 Encounters:  02/01/21 165 lb 9.6 oz (75.1 kg)  11/28/20 166 lb (75.3 kg)  05/31/20 163 lb (73.9 kg)     She was last seen for hypertension 2 months ago.  BP at that visit was 163/84. Management since that visit includes; Increased losartan from 50 to 100 mg daily. She reports excellent compliance with treatment. She is not having side effects.  She is exercising. She is not adherent to low salt diet.   Outside blood pressures are not being checked.  She does not smoke.  Use of agents associated with hypertension: none.   --------------------------------------------------------------------------------------------------- Lipid/Cholesterol, follow-up  Last Lipid Panel: Lab Results  Component Value Date   CHOL 132 01/11/2020   LDLCALC 61 01/11/2020   HDL 60 01/11/2020   TRIG 50 01/11/2020    She was last seen for this 01/11/2020.  Management since that visit includes; on simvastatin.  She reports excellent compliance with treatment. She is not having side effects.   She is following a Regular diet. Current exercise: walking  Last metabolic panel Lab Results  Component Value Date   GLUCOSE 91 01/11/2020   NA 136 01/11/2020   K 4.0 01/11/2020   BUN 19 01/11/2020   CREATININE 0.96 01/11/2020   GFRNONAA 54 (L) 01/11/2020   GFRAA 62 01/11/2020   CALCIUM 9.5 01/11/2020   AST 30 01/11/2020   ALT 17 01/11/2020   The ASCVD Risk score Mikey Bussing DC  Jr., et al., 2013) failed to calculate for the following reasons:   The 2013 ASCVD risk score is only valid for ages 61 to 65  --------------------------------------------------------------------------------------------------- Follow up for LLQ pain  The patient was last seen for this 2 months ago. Changes made at last visit include; Try Metamucil and probiotic daily.  She reports excellent compliance with treatment. She feels that condition is Improved. She is not having side effects.   -----------------------------------------------------------------------------------------   Patient Active Problem List   Diagnosis Date Noted   Abdominal pain, epigastric    Gastritis    Hiatal hernia    Cervical stenosis (uterine cervix) 01/12/2016   Abnormal ultrasound of endometrium 01/12/2016   Abnormal collection of fluid in uterine cavity 01/12/2016   Pelvic pain in female 01/12/2016   Medicare annual wellness visit, subsequent 01/25/2015   Allergic rhinitis 11/26/2014   Basal cell carcinoma of nose 11/26/2014   DD (diverticular disease) 11/26/2014   Bloodgood disease 11/26/2014   Essential (primary) hypertension 11/26/2014   Hypercholesteremia 11/26/2014   Adult hypothyroidism 11/26/2014   Cannot sleep 11/26/2014   Arthritis, degenerative 11/26/2014   Past Medical History:Diagnosis Date   Arthritis    hands and wrist and knees   Cough    chronic/allergies   GERD (gastroesophageal reflux disease)    Hyperlipidemia    Hypertension    controlled on meds   Hypothyroidism    Wears partial dentures    upper  AllergiesAllergen Reactions   Sulfa Antibiotics Itching       Medications: Outpatient Medications Prior to Visit  Medication Sig   alendronate (FOSAMAX) 70 MG tablet Take 1 tablet (70 mg total) by mouth once a week. Take with a full glass of water on an empty stomach.   Calcium-Magnesium-Vitamin D (CALCIUM 500 PO) Take by mouth. am   cetirizine (ZYRTEC) 10 MG tablet  Take by mouth as needed.    hydrochlorothiazide (HYDRODIURIL) 25 MG tablet TAKE 1 TABLET EVERY DAY   levothyroxine (SYNTHROID) 50 MCG tablet TAKE 1 TABLET EVERY DAY   losartan (COZAAR) 100 MG tablet Take 1 tablet (100 mg total) by mouth daily.   Multiple Vitamins-Minerals (MULTIVITAMIN ADULT PO) Take by mouth. am   pantoprazole (PROTONIX) 40 MG tablet Take 1 tablet (40 mg total) by mouth 2 (two) times daily.   simvastatin (ZOCOR) 20 MG tablet TAKE 1 TABLET AT BEDTIME   ALPRAZolam (XANAX) 0.5 MG tablet Take 1 tablet (0.5 mg total) by mouth at bedtime as needed for anxiety. (Patient not taking: No sig reported)   [DISCONTINUED] doxycycline (VIBRA-TABS) 100 MG tablet Take 1 tablet (100 mg total) by mouth 2 (two) times daily. (Patient not taking: No sig reported)   [DISCONTINUED] doxycycline (VIBRAMYCIN) 100 MG capsule Take 1 capsule (100 mg total) by mouth 2 (two) times daily. (Patient not taking: No sig reported)   No facility-administered medications prior to visit.    Review of Systems  Constitutional:  Negative for appetite change, chills, fatigue and fever.  Respiratory:  Negative for chest tightness and shortness of breath.   Cardiovascular:  Negative for chest pain and palpitations.  Gastrointestinal:  Negative for abdominal pain, nausea and vomiting.  Neurological:  Negative for dizziness and weakness.       Objective    BP (!) 163/69   Pulse 70   Temp 98.1 F (36.7 C) (Oral)   Resp 16   Wt 165 lb 9.6 oz (75.1 kg)   LMP  (LMP Unknown)   SpO2 98%   BMI 30.29 kg/m  BP Readings from Last 3 Encounters:  02/01/21 (!) 163/69  11/28/20 (!) 163/84  05/31/20 (!) 151/64   Wt Readings from Last 3 Encounters:  02/01/21 165 lb 9.6 oz (75.1 kg)  11/28/20 166 lb (75.3 kg)  05/31/20 163 lb (73.9 kg)       Physical Exam Vitals reviewed.  Constitutional:      Appearance: Normal appearance.  HENT:     Head: Normocephalic and atraumatic.     Right Ear: External ear normal.      Left Ear: External ear normal.  Eyes:     General: No scleral icterus.    Conjunctiva/sclera: Conjunctivae normal.  Cardiovascular:     Rate and Rhythm: Normal rate and regular rhythm.     Pulses: Normal pulses.     Heart sounds: Normal heart sounds.  Pulmonary:     Effort: Pulmonary effort is normal.     Breath sounds: Normal breath sounds.  Musculoskeletal:     Right lower leg: No edema.     Left lower leg: No edema.  Skin:    General: Skin is warm and dry.  Neurological:     General: No focal deficit present.     Mental Status: She is alert and oriented to person, place, and time.  Psychiatric:        Mood and Affect: Mood normal.        Behavior: Behavior normal.  Thought Content: Thought content normal.        Judgment: Judgment normal.      No results found for any visits on 02/01/21.  Assessment & Plan     1. Adult hypothyroidism  2. Essential (primary) hypertension Coat hypertension.  Blood pressure at home runs 115/80 on average  3. Hypercholesteremia On simvastatin 20   Return in about 4 months (around 06/04/2021).      I, [name], MD, have reviewed all documentation for this visit. The documentation on 02/05/21 for the exam, diagnosis, procedures, and orders are all accurate and complete.    Lukasz Rogus Cranford Mon, MD  [facility] [phone] (phone) [phone] (fax)  [place]

## 2021-02-01 NOTE — Patient Instructions (Signed)
BRING IN YOUR BLOOD PRESSURE CUFF AT NEXT OFFICE VISIT.

## 2021-03-13 ENCOUNTER — Other Ambulatory Visit: Payer: Self-pay | Admitting: Family Medicine

## 2021-03-13 DIAGNOSIS — Z1231 Encounter for screening mammogram for malignant neoplasm of breast: Secondary | ICD-10-CM

## 2021-03-29 ENCOUNTER — Telehealth: Payer: Self-pay | Admitting: Family Medicine

## 2021-03-29 DIAGNOSIS — R1013 Epigastric pain: Secondary | ICD-10-CM

## 2021-03-29 MED ORDER — PANTOPRAZOLE SODIUM 40 MG PO TBEC: 40.0000 mg | DELAYED_RELEASE_TABLET | Freq: Two times a day (BID) | ORAL | 3 refills | Status: DC

## 2021-03-29 NOTE — Telephone Encounter (Signed)
[facility] faxed refill request for the following medications:   pantoprazole (PROTONIX) 40 MG tablet   Please advise.

## 2021-04-05 ENCOUNTER — Ambulatory Visit
Admission: RE | Admit: 2021-04-05 | Discharge: 2021-04-05 | Disposition: A | Discharge status: Home / Self Care | Payer: Medicare HMO | Source: Ambulatory Visit | Attending: Family Medicine | Admitting: Family Medicine

## 2021-04-05 ENCOUNTER — Other Ambulatory Visit: Payer: Self-pay

## 2021-04-05 DIAGNOSIS — Z1231 Encounter for screening mammogram for malignant neoplasm of breast: Secondary | ICD-10-CM | POA: Diagnosis not present

## 2021-04-10 DIAGNOSIS — H43813 Vitreous degeneration, bilateral: Secondary | ICD-10-CM | POA: Diagnosis not present

## 2021-04-10 DIAGNOSIS — Z0100 Encounter for examination of eyes and vision without abnormal findings: Secondary | ICD-10-CM | POA: Diagnosis not present

## 2021-05-02 ENCOUNTER — Ambulatory Visit (INDEPENDENT_AMBULATORY_CARE_PROVIDER_SITE_OTHER): Payer: Medicare HMO | Admitting: Family Medicine

## 2021-05-02 ENCOUNTER — Other Ambulatory Visit: Payer: Self-pay

## 2021-05-02 ENCOUNTER — Encounter: Payer: Self-pay | Admitting: Family Medicine

## 2021-05-02 VITALS — BP 152/70 | HR 77 | Temp 98.4°F | Wt 164.0 lb

## 2021-05-02 DIAGNOSIS — E7800 Pure hypercholesterolemia, unspecified: Secondary | ICD-10-CM | POA: Diagnosis not present

## 2021-05-02 DIAGNOSIS — Z23 Encounter for immunization: Secondary | ICD-10-CM

## 2021-05-02 DIAGNOSIS — I10 Essential (primary) hypertension: Secondary | ICD-10-CM

## 2021-05-02 DIAGNOSIS — E039 Hypothyroidism, unspecified: Secondary | ICD-10-CM | POA: Diagnosis not present

## 2021-05-02 DIAGNOSIS — M67432 Ganglion, left wrist: Secondary | ICD-10-CM | POA: Diagnosis not present

## 2021-05-02 DIAGNOSIS — H6121 Impacted cerumen, right ear: Secondary | ICD-10-CM | POA: Diagnosis not present

## 2021-05-02 MED ORDER — PANTOPRAZOLE SODIUM 40 MG PO TBEC: 40.0000 mg | DELAYED_RELEASE_TABLET | Freq: Every day | ORAL | 1 refills | Status: DC

## 2021-05-02 NOTE — Progress Notes (Signed)
Established patient visit   Patient: Lynn Williams   DOB: 03-Oct-1931   87 y.o. Female  MRN: [id number] Visit Date: 05/02/2021  Today's healthcare provider: [name], MD   Chief Complaint  Patient presents with   Ear Fullness    Subjective    Ear Fullness  There is pain in the right ear. This is a new problem. The current episode started 1 to 4 weeks ago. The problem occurs constantly. The problem has been unchanged. There has been no fever. Associated symptoms include coughing (Chronic Issue) and hearing loss. Pertinent negatives include no ear discharge, headaches, rhinorrhea or sore throat.   She has decreased hearing in that ear.  No trauma and no drainage  Also has a growth on her left wrist that is nontender. Overall patient feels well. Is taking her medications and home blood pressure readings are good.    Medications: Outpatient Medications Prior to Visit  Medication Sig   alendronate (FOSAMAX) 70 MG tablet Take 1 tablet (70 mg total) by mouth once a week. Take with a full glass of water on an empty stomach.   Calcium-Magnesium-Vitamin D (CALCIUM 500 PO) Take by mouth. am   cetirizine (ZYRTEC) 10 MG tablet Take by mouth as needed.    hydrochlorothiazide (HYDRODIURIL) 25 MG tablet TAKE 1 TABLET EVERY DAY   levothyroxine (SYNTHROID) 50 MCG tablet TAKE 1 TABLET EVERY DAY   losartan (COZAAR) 100 MG tablet TAKE 1 TABLET EVERY DAY   Multiple Vitamins-Minerals (MULTIVITAMIN ADULT PO) Take by mouth. am   simvastatin (ZOCOR) 20 MG tablet TAKE 1 TABLET AT BEDTIME   [DISCONTINUED] pantoprazole (PROTONIX) 40 MG tablet Take 1 tablet (40 mg total) by mouth 2 (two) times daily.   ALPRAZolam (XANAX) 0.5 MG tablet Take 1 tablet (0.5 mg total) by mouth at bedtime as needed for anxiety. (Patient not taking: No sig reported)   No facility-administered medications prior to visit.    Review of Systems  Constitutional: Negative.   HENT:  Positive for hearing loss,  sinus pressure and sinus pain. Negative for congestion, ear discharge, ear pain, postnasal drip, rhinorrhea, sore throat and tinnitus.   Eyes: Negative.   Respiratory:  Positive for cough (Chronic Issue). Negative for apnea, choking, chest tightness, shortness of breath, wheezing and stridor.   Allergic/Immunologic: Positive for environmental allergies.  Neurological:  Negative for dizziness, light-headedness and headaches.      Objective    BP (!) 152/70 (BP Location: Right Arm, Patient Position: Sitting, Cuff Size: Large)   Pulse 77   Temp 98.4 F (36.9 C) (Oral)   Wt 164 lb (74.4 kg)   LMP  (LMP Unknown)   SpO2 99%   BMI 30.00 kg/m    Physical Exam Vitals reviewed.  Constitutional:      General: She is not in acute distress.    Appearance: She is well-developed.  HENT:     Head: Normocephalic and atraumatic.     Right Ear: Hearing and external ear normal.     Left Ear: Hearing, tympanic membrane, ear canal and external ear normal.     Ears:     Comments: Right EAC blocked with cerumen.    Nose: Nose normal.  Eyes:     General: Lids are normal. No scleral icterus.       Right eye: No discharge.        Left eye: No discharge.     Conjunctiva/sclera: Conjunctivae normal.  Cardiovascular:  Rate and Rhythm: Normal rate and regular rhythm.     Heart sounds: Normal heart sounds.  Pulmonary:     Effort: Pulmonary effort is normal. No respiratory distress.  Musculoskeletal:     Comments: She has an apparent ganglion cyst over the left wrist on the ulnar side on the palmar side of the wrist  Skin:    General: Skin is warm and dry.     Findings: No lesion or rash.  Neurological:     General: No focal deficit present.     Mental Status: She is alert and oriented to person, place, and time.  Psychiatric:        Mood and Affect: Mood normal.        Speech: Speech normal.        Behavior: Behavior normal.        Thought Content: Thought content normal.        Judgment:  Judgment normal.      No results found for any visits on 05/02/21.  Assessment & Plan     1. Ceruminosis, right With irrigation the patient feels much better leaving the office  2. Need for influenza vaccination  - Flu Vaccine QUAD High Dose(Fluad)  3. Ganglion of left wrist Will refer to hand surgeon for starts bothering the patient  4. Essential (primary) hypertension Fair control  5. Adult hypothyroidism On Synthroid  6. Hypercholesteremia On simvastatin   No follow-ups on file.      .`prov    [name], MD  [facility] [phone] (phone) [phone] (fax)  [place]

## 2021-05-29 ENCOUNTER — Other Ambulatory Visit: Payer: Self-pay | Admitting: Family Medicine

## 2021-05-29 DIAGNOSIS — I10 Essential (primary) hypertension: Secondary | ICD-10-CM

## 2021-05-29 NOTE — Telephone Encounter (Signed)
Last labs 01/11/20 Requested Prescriptions  Pending Prescriptions Disp Refills  . simvastatin (ZOCOR) 20 MG tablet [Pharmacy Med Name: SIMVASTATIN 20 MG Tablet] 90 tablet 0    Sig: TAKE 1 TABLET AT BEDTIME     Cardiovascular:  Antilipid - Statins Failed - 05/29/2021  1:13 PM      Failed - Total Cholesterol in normal range and within 360 days    Cholesterol, Total  Date Value Ref Range Status  01/11/2020 132 100 - 199 mg/dL Final         Failed - LDL in normal range and within 360 days    LDL Chol Calc (NIH)  Date Value Ref Range Status  01/11/2020 61 0 - 99 mg/dL Final         Failed - HDL in normal range and within 360 days    HDL  Date Value Ref Range Status  01/11/2020 60 >39 mg/dL Final         Failed - Triglycerides in normal range and within 360 days    Triglycerides  Date Value Ref Range Status  01/11/2020 50 0 - 149 mg/dL Final         Passed - Patient is not pregnant      Passed - Valid encounter within last 12 months    Recent Outpatient Visits          3 weeks ago Ceruminosis, right   [facility] Jerrol Banana., MD   3 months ago Adult hypothyroidism   [facility] Jerrol Banana., MD   6 months ago Essential (primary) hypertension   [facility] Jerrol Banana., MD   12 months ago Annual physical exam   [facility] Jerrol Banana., MD   1 year ago Adult hypothyroidism   [facility] Jerrol Banana., MD             . hydrochlorothiazide (HYDRODIURIL) 25 MG tablet [Pharmacy Med Name: HYDROCHLOROTHIAZIDE 25 MG Tablet] 90 tablet 0    Sig: TAKE 1 TABLET EVERY DAY     Cardiovascular: Diuretics - Thiazide Failed - 05/29/2021  1:13 PM      Failed - Ca in normal range and within 360 days    Calcium  Date Value Ref Range Status  01/11/2020 9.5 8.7 - 10.3 mg/dL Final         Failed - Cr in normal range and within 360 days    Creatinine, Ser  Date  Value Ref Range Status  01/11/2020 0.96 0.57 - 1.00 mg/dL Final         Failed - K in normal range and within 360 days    Potassium  Date Value Ref Range Status  01/11/2020 4.0 3.5 - 5.2 mmol/L Final  08/17/2014 3.2 (L) 3.5 - 5.1 mmol/L Final         Failed - Na in normal range and within 360 days    Sodium  Date Value Ref Range Status  01/11/2020 136 134 - 144 mmol/L Final         Failed - Last BP in normal range    BP Readings from Last 1 Encounters:  05/02/21 (!) 152/70         Passed - Valid encounter within last 6 months    Recent Outpatient Visits          3 weeks ago Ceruminosis, right   [facility] Jerrol Banana., MD  3 months ago Adult hypothyroidism   [facility] Jerrol Banana., MD   6 months ago Essential (primary) hypertension   [facility] Jerrol Banana., MD   12 months ago Annual physical exam   [facility] Jerrol Banana., MD   1 year ago Adult hypothyroidism   [facility] Jerrol Banana., MD

## 2021-06-12 ENCOUNTER — Other Ambulatory Visit: Payer: Self-pay | Admitting: Family Medicine

## 2021-06-12 DIAGNOSIS — M810 Age-related osteoporosis without current pathological fracture: Secondary | ICD-10-CM

## 2021-06-12 NOTE — Telephone Encounter (Signed)
Requested medications are due for refill today.  yes  Requested medications are on the active medications list.  yes  Last refill. 06/10/2020  Future visit scheduled.   yes  Notes to clinic. Missing and expired labs.

## 2021-06-21 ENCOUNTER — Other Ambulatory Visit: Payer: Self-pay

## 2021-06-21 ENCOUNTER — Ambulatory Visit (INDEPENDENT_AMBULATORY_CARE_PROVIDER_SITE_OTHER): Payer: Medicare HMO | Admitting: Family Medicine

## 2021-06-21 VITALS — BP 145/86 | HR 77 | Temp 98.4°F | Ht 62.0 in | Wt 162.4 lb

## 2021-06-21 DIAGNOSIS — M810 Age-related osteoporosis without current pathological fracture: Secondary | ICD-10-CM | POA: Diagnosis not present

## 2021-06-21 DIAGNOSIS — I10 Essential (primary) hypertension: Secondary | ICD-10-CM | POA: Diagnosis not present

## 2021-06-21 DIAGNOSIS — E7800 Pure hypercholesterolemia, unspecified: Secondary | ICD-10-CM | POA: Diagnosis not present

## 2021-06-21 DIAGNOSIS — E2839 Other primary ovarian failure: Secondary | ICD-10-CM | POA: Diagnosis not present

## 2021-06-21 DIAGNOSIS — Z0000 Encounter for general adult medical examination without abnormal findings: Secondary | ICD-10-CM | POA: Diagnosis not present

## 2021-06-21 DIAGNOSIS — E039 Hypothyroidism, unspecified: Secondary | ICD-10-CM

## 2021-06-21 NOTE — Progress Notes (Signed)
Annual Wellness Visit     Patient: Lynn Williams, Female    DOB: Aug 17, 1931, 87 y.o.   MRN: [id number] Visit Date: 06/21/2021  Today's Provider: [name], MD   Chief Complaint  Patient presents with   Medicare Wellness   Subjective    Lynn Williams is a 87 y.o. female who presents today for her Annual Wellness Visit.Annual Physical. She reports consuming a general diet. Home exercise routine includes gym. She generally feels well. She reports sleeping well. She does not have additional problems to discuss today.  Overall she feels very well. She has no complaints and has had no falls. HPI    Medications: Outpatient Medications Prior to Visit  Medication Sig   alendronate (FOSAMAX) 70 MG tablet TAKE 1 TABLET (70 MG TOTAL) BY MOUTH ONCE A WEEK. TAKE WITH A FULL GLASS OF WATER ON AN EMPTY STOMACH.   Calcium-Magnesium-Vitamin D (CALCIUM 500 PO) Take by mouth. am   cetirizine (ZYRTEC) 10 MG tablet Take by mouth as needed.    hydrochlorothiazide (HYDRODIURIL) 25 MG tablet TAKE 1 TABLET EVERY DAY   levothyroxine (SYNTHROID) 50 MCG tablet TAKE 1 TABLET EVERY DAY   losartan (COZAAR) 100 MG tablet TAKE 1 TABLET EVERY DAY   Multiple Vitamins-Minerals (MULTIVITAMIN ADULT PO) Take by mouth. am   pantoprazole (PROTONIX) 40 MG tablet Take 1 tablet (40 mg total) by mouth daily.   simvastatin (ZOCOR) 20 MG tablet TAKE 1 TABLET AT BEDTIME   ALPRAZolam (XANAX) 0.5 MG tablet Take 1 tablet (0.5 mg total) by mouth at bedtime as needed for anxiety. (Patient not taking: Reported on 12/14/2019)   No facility-administered medications prior to visit.    Allergies  Allergen Reactions   Sulfa Antibiotics Itching    Patient Care Team: Jerrol Banana., MD as PCP - General (Family Medicine) Birder Robson, MD as Referring Physician (Ophthalmology)  Review of Systems  All other systems reviewed and are negative.       Objective    Vitals: BP (!) 145/86 (BP  Location: Right Arm, Patient Position: Sitting, Cuff Size: Normal)   Pulse 77   Temp 98.4 F (36.9 C) (Temporal)   Ht 5\' 2"  (1.575 m)   Wt 162 lb 6.4 oz (73.7 kg)   LMP  (LMP Unknown)   SpO2 99%   BMI 29.70 kg/m  BP Readings from Last 3 Encounters:  06/21/21 (!) 145/86  05/02/21 (!) 152/70  02/01/21 (!) 163/69   Wt Readings from Last 3 Encounters:  06/21/21 162 lb 6.4 oz (73.7 kg)  05/02/21 164 lb (74.4 kg)  02/01/21 165 lb 9.6 oz (75.1 kg)      Physical Exam Vitals reviewed.  Constitutional:      Appearance: She is well-developed.  HENT:     Head: Normocephalic and atraumatic.     Right Ear: External ear normal.     Left Ear: External ear normal.     Nose: Nose normal.  Eyes:     Conjunctiva/sclera: Conjunctivae normal.     Pupils: Pupils are equal, round, and reactive to light.  Cardiovascular:     Rate and Rhythm: Normal rate and regular rhythm.     Heart sounds: Normal heart sounds.  Pulmonary:     Effort: Pulmonary effort is normal.     Breath sounds: Normal breath sounds.  Chest:  Breasts:    Breasts are symmetrical.     Right: Normal. No swelling, bleeding, inverted nipple, mass, nipple discharge, skin  change or tenderness.     Left: Normal. No swelling, bleeding, inverted nipple, mass, nipple discharge, skin change or tenderness.  Abdominal:     General: Bowel sounds are normal.     Palpations: Abdomen is soft.  Musculoskeletal:        General: Normal range of motion.     Cervical back: Normal range of motion and neck supple.  Skin:    General: Skin is warm and dry.  Neurological:     General: No focal deficit present.     Mental Status: She is alert and oriented to person, place, and time.  Psychiatric:        Mood and Affect: Mood normal.        Behavior: Behavior normal.        Thought Content: Thought content normal.        Judgment: Judgment normal.     Most recent functional status assessment: In your present state of health, do you have  any difficulty performing the following activities: 11/28/2020  Hearing? N  Vision? N  Difficulty concentrating or making decisions? N  Walking or climbing stairs? N  Dressing or bathing? N  Doing errands, shopping? N  Some recent data might be hidden   Most recent fall risk assessment: Fall Risk  11/28/2020  Falls in the past year? 0  Number falls in past yr: 0  Injury with Fall? 0  Follow up Falls evaluation completed    Most recent depression screenings: PHQ 2/9 Scores 11/28/2020 05/31/2020  PHQ - 2 Score 0 0  PHQ- 9 Score 2 0   Most recent cognitive screening: 6CIT Screen 12/10/2018  What Year? 0 points  What month? 0 points  What time? 0 points  Count back from 20 0 points  Months in reverse 0 points  Repeat phrase 0 points  Total Score 0   Most recent Audit-C alcohol use screening Alcohol Use Disorder Test (AUDIT) 11/28/2020  1. How often do you have a drink containing alcohol? 0  2. How many drinks containing alcohol do you have on a typical day when you are drinking? 0  3. How often do you have six or more drinks on one occasion? 0  AUDIT-C Score 0  Alcohol Brief Interventions/Follow-up -   A score of 3 or more in women, and 4 or more in men indicates increased risk for alcohol abuse, EXCEPT if all of the points are from question 1   No results found for any visits on 06/21/21.  Assessment & Plan     Annual wellness visit done today including the all of the following: Reviewed patient's Family Medical History Reviewed and updated list of patient's medical providers Assessment of cognitive impairment was done Assessed patient's functional ability Established a written schedule for health screening [place] Completed and Reviewed  Exercise Activities and Dietary recommendations  Goals      DIET - REDUCE SUGAR INTAKE     Recommend cutting out all sugar in diet to help aid in weight loss and decrease BMI.     Increase water intake      Starting 08/28/16, I will increase my water intake to 5 glasses a day.        Immunization History  Administered Date(s) Administered   Fluad Quad(high Dose 65+) 05/07/2019, 06/15/2020, 05/02/2021   Influenza, High Dose Seasonal PF 05/04/2015, 05/14/2016, 05/08/2017, 04/03/2018   PFIZER(Purple Top)SARS-COV-2 Vaccination 08/18/2019, 09/08/2019, 06/02/2020   Pneumococcal Conjugate-13 12/23/2013   Pneumococcal Polysaccharide-23  07/24/1996, 08/04/2003    Health Maintenance  Topic Date Due   Zoster Vaccines- Shingrix (1 of 2) Never done   COVID-19 Vaccine (4 - Booster for Pfizer series) 07/28/2020   DEXA SCAN  04/04/2022   TETANUS/TDAP  12/24/2023   Pneumonia Vaccine 34+ Years old  Completed   INFLUENZA VACCINE  Completed   HPV VACCINES  Aged Out     Discussed health benefits of physical activity, and encouraged her to engage in regular exercise appropriate for her age and condition.     1. Encounter for Medicare annual wellness exam Bring MOST forms on next visit - Lipid panel - TSH - CBC w/Diff/Platelet - Comprehensive Metabolic Panel (CMET)  2. Annual physical exam Up-to-date at 89  3. Essential (primary) hypertension  - Lipid panel - TSH - CBC w/Diff/Platelet - Comprehensive Metabolic Panel (CMET)  4. Hypercholesteremia  - Lipid panel - TSH - CBC w/Diff/Platelet - Comprehensive Metabolic Panel (CMET)  5. Adult hypothyroidism  - Lipid panel - TSH - CBC w/Diff/Platelet - Comprehensive Metabolic Panel (CMET)  6. Osteoporosis, unspecified osteoporosis type, unspecified pathological fracture presence  - Lipid panel - TSH - CBC w/Diff/Platelet - Comprehensive Metabolic Panel (CMET)  7. Estrogen deficiency  - Lipid panel - TSH - CBC w/Diff/Platelet - Comprehensive Metabolic Panel (CMET)    Return in about 6 months (around 12/19/2021).     I, [name], MD, have reviewed all documentation for this visit. The documentation on 06/21/21 for  the exam, diagnosis, procedures, and orders are all accurate and complete.    Corda Shutt Cranford Mon, MD  [facility] [phone] (phone) [phone] (fax)  [place]

## 2021-06-22 LAB — CBC WITH DIFFERENTIAL/PLATELET
Basophils Absolute: 0 10*3/uL (ref 0.0–0.2)
Basos: 1 %
EOS (ABSOLUTE): 0.1 10*3/uL (ref 0.0–0.4)
Eos: 2 %
Hematocrit: 35.3 % (ref 34.0–46.6)
Hemoglobin: 11.7 g/dL (ref 11.1–15.9)
Immature Grans (Abs): 0 10*3/uL (ref 0.0–0.1)
Immature Granulocytes: 0 %
Lymphocytes Absolute: 1.5 10*3/uL (ref 0.7–3.1)
Lymphs: 30 %
MCH: 28.8 pg (ref 26.6–33.0)
MCHC: 33.1 g/dL (ref 31.5–35.7)
MCV: 87 fL (ref 79–97)
Monocytes Absolute: 0.5 10*3/uL (ref 0.1–0.9)
Monocytes: 9 %
Neutrophils Absolute: 2.9 10*3/uL (ref 1.4–7.0)
Neutrophils: 58 %
Platelets: 263 10*3/uL (ref 150–450)
RBC: 4.06 x10E6/uL (ref 3.77–5.28)
RDW: 13.2 % (ref 11.7–15.4)
WBC: 5 10*3/uL (ref 3.4–10.8)

## 2021-06-22 LAB — LIPID PANEL
Chol/HDL Ratio: 2.4 ratio (ref 0.0–4.4)
Cholesterol, Total: 150 mg/dL (ref 100–199)
HDL: 62 mg/dL (ref >39)
LDL Chol Calc (NIH): 72 mg/dL (ref 0–99)
Triglycerides: 88 mg/dL (ref 0–149)
VLDL Cholesterol Cal: 16 mg/dL (ref 5–40)

## 2021-06-22 LAB — COMPREHENSIVE METABOLIC PANEL
ALT: 12 IU/L (ref 0–32)
AST: 22 IU/L (ref 0–40)
Albumin/Globulin Ratio: 1.7 (ref 1.2–2.2)
Albumin: 4.3 g/dL (ref 3.6–4.6)
Alkaline Phosphatase: 63 IU/L (ref 44–121)
BUN/Creatinine Ratio: 13 (ref 12–28)
BUN: 12 mg/dL (ref 8–27)
Bilirubin Total: 0.5 mg/dL (ref 0.0–1.2)
CO2: 29 mmol/L (ref 20–29)
Calcium: 9.7 mg/dL (ref 8.7–10.3)
Chloride: 95 mmol/L — ABNORMAL LOW (ref 96–106)
Creatinine, Ser: 0.96 mg/dL (ref 0.57–1.00)
Globulin, Total: 2.5 g/dL (ref 1.5–4.5)
Glucose: 87 mg/dL (ref 70–99)
Potassium: 4.4 mmol/L (ref 3.5–5.2)
Sodium: 135 mmol/L (ref 134–144)
Total Protein: 6.8 g/dL (ref 6.0–8.5)
eGFR: 57 mL/min/{1.73_m2} — ABNORMAL LOW (ref >59)

## 2021-06-22 LAB — TSH: TSH: 7.06 u[IU]/mL — ABNORMAL HIGH (ref 0.450–4.500)

## 2021-06-27 ENCOUNTER — Telehealth: Payer: Self-pay

## 2021-06-27 DIAGNOSIS — E039 Hypothyroidism, unspecified: Secondary | ICD-10-CM

## 2021-06-27 MED ORDER — LEVOTHYROXINE SODIUM 75 MCG PO TABS: 75.0000 ug | ORAL_TABLET | Freq: Every day | ORAL | 1 refills | Status: DC

## 2021-06-27 NOTE — Telephone Encounter (Signed)
-----   Message from Virginia Crews, MD sent at 06/26/2021 11:08 AM EST ----- Normal/stable labs, except for high TSH.  This means that Synthroid dose is too low if she has been taking it without missed doses.  Increase to 54mcg daily. Set up f/u with PCP in 2 months to recheck.

## 2021-06-27 NOTE — Telephone Encounter (Signed)
Patient advised.  She reports taking medication daily. Agreed to increasing synthroid to 58mcg daily. Patient to call in two months to recheck lab.

## 2021-07-10 ENCOUNTER — Other Ambulatory Visit: Payer: Self-pay

## 2021-07-10 ENCOUNTER — Telehealth: Payer: Self-pay | Admitting: Family Medicine

## 2021-07-10 MED ORDER — PANTOPRAZOLE SODIUM 40 MG PO TBEC
40.0000 mg | DELAYED_RELEASE_TABLET | Freq: Every day | ORAL | 1 refills | Status: DC
Start: 2021-07-10 — End: 2021-12-25

## 2021-07-10 NOTE — Telephone Encounter (Signed)
[facility] faxed refill request for the following medications:   pantoprazole (PROTONIX) 40 MG tablet   Please advise.  

## 2021-08-29 ENCOUNTER — Telehealth: Payer: Self-pay | Admitting: Family Medicine

## 2021-08-29 NOTE — Telephone Encounter (Signed)
Please advise 

## 2021-08-29 NOTE — Telephone Encounter (Signed)
Pts Thyroid medication was changed and she was advised to call Dr. Rosanna Randy to see if he needed to repeat blood work or if she needs to come in for an appt / pleases advise

## 2021-08-30 NOTE — Telephone Encounter (Signed)
Later this month get TSH then see me or can do both on same visit if that is more convenient

## 2021-08-31 NOTE — Telephone Encounter (Signed)
Patient advised. Patient stated she will wait until her appointment.

## 2021-09-25 ENCOUNTER — Other Ambulatory Visit: Payer: Self-pay | Admitting: Family Medicine

## 2021-09-25 DIAGNOSIS — I10 Essential (primary) hypertension: Secondary | ICD-10-CM

## 2021-11-07 ENCOUNTER — Other Ambulatory Visit: Payer: Self-pay | Admitting: Family Medicine

## 2021-11-07 DIAGNOSIS — I10 Essential (primary) hypertension: Secondary | ICD-10-CM

## 2021-11-07 NOTE — Telephone Encounter (Signed)
Requested Prescriptions  ?Pending Prescriptions Disp Refills  ?? hydrochlorothiazide (HYDRODIURIL) 25 MG tablet [Pharmacy Med Name: HYDROCHLOROTHIAZIDE 25 MG Tablet] 90 tablet 0  ?  Sig: TAKE 1 TABLET EVERY DAY  ?  ? Cardiovascular: Diuretics - Thiazide Failed - 11/07/2021  2:22 PM  ?  ?  Failed - Last BP in normal range  ?  BP Readings from Last 1 Encounters:  ?06/21/21 (!) 145/86  ?   ?  ?  Passed - Cr in normal range and within 180 days  ?  Creatinine, Ser  ?Date Value Ref Range Status  ?06/21/2021 0.96 0.57 - 1.00 mg/dL Final  ?   ?  ?  Passed - K in normal range and within 180 days  ?  Potassium  ?Date Value Ref Range Status  ?06/21/2021 4.4 3.5 - 5.2 mmol/L Final  ?08/17/2014 3.2 (L) 3.5 - 5.1 mmol/L Final  ?   ?  ?  Passed - Na in normal range and within 180 days  ?  Sodium  ?Date Value Ref Range Status  ?06/21/2021 135 134 - 144 mmol/L Final  ?   ?  ?  Passed - Valid encounter within last 6 months  ?  Recent Outpatient Visits   ?      ? 4 months ago Encounter for [employer] annual wellness exam  ? [facility] Jerrol Banana., MD  ? 6 months ago Ceruminosis, right  ? [facility] Jerrol Banana., MD  ? 9 months ago Adult hypothyroidism  ? [facility] Jerrol Banana., MD  ? 11 months ago Essential (primary) hypertension  ? [facility] Jerrol Banana., MD  ? 1 year ago Annual physical exam  ? [facility] Jerrol Banana., MD  ?  ?  ?Future Appointments   ?        ? In 1 month Jerrol Banana., MD [facility]  ?  ? ?  ?  ?  ?? simvastatin (ZOCOR) 20 MG tablet [Pharmacy Med Name: SIMVASTATIN 20 MG Tablet] 90 tablet 2  ?  Sig: TAKE 1 TABLET AT BEDTIME  ?  ? Cardiovascular:  Antilipid - Statins Failed - 11/07/2021  2:22 PM  ?  ?  Failed - Lipid Panel in normal range within the last 12 months  ?  Cholesterol, Total  ?Date Value Ref Range Status  ?06/21/2021 150 100 - 199 mg/dL Final   ? ?LDL Chol Calc (NIH)  ?Date Value Ref Range Status  ?06/21/2021 72 0 - 99 mg/dL Final  ? ?HDL  ?Date Value Ref Range Status  ?06/21/2021 62 >39 mg/dL Final  ? ?Triglycerides  ?Date Value Ref Range Status  ?06/21/2021 88 0 - 149 mg/dL Final  ? ?  ?  ?  Passed - Patient is not pregnant  ?  ?  Passed - Valid encounter within last 12 months  ?  Recent Outpatient Visits   ?      ? 4 months ago Encounter for [employer] annual wellness exam  ? [facility] Jerrol Banana., MD  ? 6 months ago Ceruminosis, right  ? [facility] Jerrol Banana., MD  ? 9 months ago Adult hypothyroidism  ? [facility] Jerrol Banana., MD  ? 11 months ago Essential (primary) hypertension  ? [facility] Jerrol Banana., MD  ? 1 year ago Annual physical exam  ?  [facility] Jerrol Banana., MD  ?  ?  ?Future Appointments   ?        ? In 1 month Jerrol Banana., MD [facility]  ?  ? ?  ?  ?  ? ?

## 2021-12-05 ENCOUNTER — Telehealth: Payer: Self-pay | Admitting: Family Medicine

## 2021-12-05 DIAGNOSIS — E039 Hypothyroidism, unspecified: Secondary | ICD-10-CM

## 2021-12-05 MED ORDER — LEVOTHYROXINE SODIUM 75 MCG PO TABS: 75.0000 ug | ORAL_TABLET | Freq: Every day | ORAL | 0 refills | Status: DC

## 2021-12-05 NOTE — Telephone Encounter (Signed)
[facility] faxed refill request for the following medications: ? ?levothyroxine (SYNTHROID) 75 MCG tablet  ? ?Please advise. ? ?

## 2021-12-05 NOTE — Telephone Encounter (Signed)
Rx sent to pharmacy   

## 2021-12-21 NOTE — Progress Notes (Unsigned)
Established patient visit  I,[name],acting as a scribe for [name], MD.,have documented all relevant documentation on the behalf of [name], MD,as directed by  [name], MD while in the presence of [name], MD.   Patient: Lynn Williams   DOB: Dec 31, 1931   88 y.o. Female  MRN: [id number] Visit Date: 12/25/2021  Today's healthcare provider: [name], MD   Chief Complaint  Patient presents with   Follow-up   Hypertension   Hypothyroidism   Subjective    HPI  Patient comes in today for routine follow-up.  Home blood pressures are 120/80. Her husband has had some growth in his lung cancer and is having a PET scan right now. She has had some low back pain that particularly bothers her in her right lower back with no radiation of the pain, no sciatica. She has had some loose bowels in recent months. Hypertension, follow-up  BP Readings from Last 3 Encounters:  12/25/21 (!) 145/73  06/21/21 (!) 145/86  05/02/21 (!) 152/70   Wt Readings from Last 3 Encounters:  12/25/21 163 lb (73.9 kg)  06/21/21 162 lb 6.4 oz (73.7 kg)  05/02/21 164 lb (74.4 kg)     She was last seen for hypertension 6 months ago.  Management since that visit includes; on HCTZ and losartan 100 mg.  Outside blood pressures are 120/80.  Pertinent labs Lab Results  Component Value Date   CHOL 150 06/21/2021   HDL 62 06/21/2021   LDLCALC 72 06/21/2021   TRIG 88 06/21/2021   CHOLHDL 2.4 06/21/2021   Lab Results  Component Value Date   NA 135 06/21/2021   K 4.4 06/21/2021   CREATININE 0.96 06/21/2021   EGFR 57 (L) 06/21/2021   GLUCOSE 87 06/21/2021   TSH 7.060 (H) 06/21/2021     The ASCVD Risk score (Arnett DK, et al., 2019) failed to calculate for the following reasons:   The 2019 ASCVD risk score is only valid for ages 56 to  39  ---------------------------------------------------------------------------------------------------  Hypothyroidism Lynn Williams is a 88 y.o. female who presents for follow up of hypothyroidism.    Medications: Outpatient Medications Prior to Visit  Medication Sig   alendronate (FOSAMAX) 70 MG tablet TAKE 1 TABLET (70 MG TOTAL) BY MOUTH ONCE A WEEK. TAKE WITH A FULL GLASS OF WATER ON AN EMPTY STOMACH.   Calcium-Magnesium-Vitamin D (CALCIUM 500 PO) Take by mouth. am   cetirizine (ZYRTEC) 10 MG tablet Take by mouth as needed.    hydrochlorothiazide (HYDRODIURIL) 25 MG tablet TAKE 1 TABLET EVERY DAY   levothyroxine (SYNTHROID) 75 MCG tablet Take 1 tablet (75 mcg total) by mouth daily before breakfast.   losartan (COZAAR) 100 MG tablet TAKE 1 TABLET EVERY DAY   Multiple Vitamins-Minerals (MULTIVITAMIN ADULT PO) Take by mouth. am   simvastatin (ZOCOR) 20 MG tablet TAKE 1 TABLET AT BEDTIME   [DISCONTINUED] pantoprazole (PROTONIX) 40 MG tablet Take 1 tablet (40 mg total) by mouth daily.   [DISCONTINUED] ALPRAZolam (XANAX) 0.5 MG tablet Take 1 tablet (0.5 mg total) by mouth at bedtime as needed for anxiety. (Patient not taking: Reported on 12/14/2019)   [DISCONTINUED] pantoprazole (PROTONIX) 40 MG tablet Take 1 tablet (40 mg total) by mouth 2 (two) times daily.   No facility-administered medications prior to visit.    Review of Systems  Constitutional:  Negative for appetite change, chills, fatigue and fever.  Respiratory:  Negative for chest tightness  and shortness of breath.   Cardiovascular:  Negative for chest pain and palpitations.  Gastrointestinal:  Negative for abdominal pain, nausea and vomiting.  Neurological:  Negative for dizziness and weakness.   Last thyroid functions Lab Results  Component Value Date   TSH 0.040 (L) 12/25/2021       Objective    BP (!) 145/73 (BP Location: Left Arm, Patient Position: Sitting, Cuff Size: Normal)   Pulse 70   Resp 16   Wt 163  lb (73.9 kg)   LMP  (LMP Unknown)   SpO2 99%   BMI 29.81 kg/m  BP Readings from Last 3 Encounters:  12/26/21 120/80  06/21/21 (!) 145/86  05/02/21 (!) 152/70   Wt Readings from Last 3 Encounters:  12/25/21 163 lb (73.9 kg)  06/21/21 162 lb 6.4 oz (73.7 kg)  05/02/21 164 lb (74.4 kg)      Physical Exam Vitals reviewed.  Constitutional:      General: She is not in acute distress.    Appearance: She is well-developed.  HENT:     Head: Normocephalic and atraumatic.     Right Ear: Hearing normal.     Left Ear: Hearing normal.     Nose: Nose normal.  Eyes:     General: Lids are normal. No scleral icterus.       Right eye: No discharge.        Left eye: No discharge.     Conjunctiva/sclera: Conjunctivae normal.  Cardiovascular:     Rate and Rhythm: Normal rate and regular rhythm.     Heart sounds: Normal heart sounds.  Pulmonary:     Effort: Pulmonary effort is normal. No respiratory distress.  Abdominal:     Palpations: Abdomen is soft.  Musculoskeletal:        General: No swelling, tenderness or deformity.  Skin:    General: Skin is warm and dry.     Findings: No lesion or rash.  Neurological:     General: No focal deficit present.     Mental Status: She is alert and oriented to person, place, and time.  Psychiatric:        Mood and Affect: Mood normal.        Speech: Speech normal.        Behavior: Behavior normal.        Thought Content: Thought content normal.        Judgment: Judgment normal.      No results found for any visits on 12/25/21.  Assessment & Plan     1. Essential (primary) hypertension Good home blood pressure readings.  Continue present regimen.  2. Adult hypothyroidism Treat for euthyroid TSH. - TSH  3. Low back pain, unspecified back pain laterality, unspecified chronicity, unspecified whether sciatica present Sounds arthritic and degenerative in nature.  Sitter using topicals and consider physical therapy referral.  Will x-ray for  baseline today - DG Lumbar Spine Complete  4. DD (diverticular disease) Possible diverticulitis but doubtful.  5. Loose stools Add Metamucil to daily regimen.   Return in about 4 months (around 04/26/2022).      I, [name], MD, have reviewed all documentation for this visit. The documentation on 12/26/21 for the exam, diagnosis, procedures, and orders are all accurate and complete.    Cherri Yera Cranford Mon, MD  [facility] [phone] (phone) [phone] (fax)  [place]

## 2021-12-25 ENCOUNTER — Ambulatory Visit
Admission: RE | Admit: 2021-12-25 | Discharge: 2021-12-25 | Disposition: A | Discharge status: Home / Self Care | Payer: Medicare HMO | Source: Ambulatory Visit | Attending: Family Medicine | Admitting: Family Medicine

## 2021-12-25 ENCOUNTER — Encounter: Payer: Self-pay | Admitting: Family Medicine

## 2021-12-25 ENCOUNTER — Ambulatory Visit
Admission: RE | Admit: 2021-12-25 | Discharge: 2021-12-25 | Disposition: A | Discharge status: Home / Self Care | Payer: Medicare HMO | Attending: Family Medicine | Admitting: Family Medicine

## 2021-12-25 ENCOUNTER — Ambulatory Visit (INDEPENDENT_AMBULATORY_CARE_PROVIDER_SITE_OTHER): Payer: Medicare HMO | Admitting: Family Medicine

## 2021-12-25 VITALS — BP 120/80 | HR 70 | Resp 16 | Wt 163.0 lb

## 2021-12-25 DIAGNOSIS — I10 Essential (primary) hypertension: Secondary | ICD-10-CM | POA: Diagnosis not present

## 2021-12-25 DIAGNOSIS — E039 Hypothyroidism, unspecified: Secondary | ICD-10-CM

## 2021-12-25 DIAGNOSIS — R195 Other fecal abnormalities: Secondary | ICD-10-CM

## 2021-12-25 DIAGNOSIS — K5790 Diverticulosis of intestine, part unspecified, without perforation or abscess without bleeding: Secondary | ICD-10-CM | POA: Diagnosis not present

## 2021-12-25 DIAGNOSIS — M545 Low back pain, unspecified: Secondary | ICD-10-CM | POA: Diagnosis not present

## 2021-12-25 NOTE — Patient Instructions (Addendum)
FOR LOOSE BOWELS, TRY OVER-THE-COUNTER METAMUCIL DAILY. BRING YOUR BLOOD PRESSURE CUFF TO NEXT OFFICE VISIT.

## 2021-12-26 LAB — TSH: TSH: 0.04 u[IU]/mL — ABNORMAL LOW (ref 0.450–4.500)

## 2022-01-02 ENCOUNTER — Other Ambulatory Visit: Payer: Self-pay

## 2022-01-02 DIAGNOSIS — E039 Hypothyroidism, unspecified: Secondary | ICD-10-CM

## 2022-01-02 NOTE — Progress Notes (Signed)
Follow up lab ordered

## 2022-02-20 ENCOUNTER — Other Ambulatory Visit: Payer: Self-pay | Admitting: Family Medicine

## 2022-02-20 DIAGNOSIS — E039 Hypothyroidism, unspecified: Secondary | ICD-10-CM

## 2022-03-05 ENCOUNTER — Other Ambulatory Visit: Payer: Self-pay | Admitting: Family Medicine

## 2022-03-05 DIAGNOSIS — Z1231 Encounter for screening mammogram for malignant neoplasm of breast: Secondary | ICD-10-CM

## 2022-04-11 ENCOUNTER — Ambulatory Visit
Admission: RE | Admit: 2022-04-11 | Discharge: 2022-04-11 | Disposition: A | Discharge status: Home / Self Care | Payer: Medicare HMO | Source: Ambulatory Visit | Attending: Family Medicine | Admitting: Family Medicine

## 2022-04-11 DIAGNOSIS — Z1231 Encounter for screening mammogram for malignant neoplasm of breast: Secondary | ICD-10-CM | POA: Insufficient documentation

## 2022-04-18 DIAGNOSIS — Z0100 Encounter for examination of eyes and vision without abnormal findings: Secondary | ICD-10-CM | POA: Diagnosis not present

## 2022-04-18 DIAGNOSIS — H43813 Vitreous degeneration, bilateral: Secondary | ICD-10-CM | POA: Diagnosis not present

## 2022-04-25 DIAGNOSIS — Z0100 Encounter for examination of eyes and vision without abnormal findings: Secondary | ICD-10-CM | POA: Diagnosis not present

## 2022-05-08 ENCOUNTER — Other Ambulatory Visit: Payer: Self-pay | Admitting: Family Medicine

## 2022-05-08 DIAGNOSIS — I10 Essential (primary) hypertension: Secondary | ICD-10-CM

## 2022-07-01 NOTE — Progress Notes (Signed)
I,Lynn Williams,acting as a scribe for [name], Williams.,have documented all relevant documentation on the behalf of Lynn Williams,as directed by  Lynn Williams while in the presence of Lynn Williams.   Annual Wellness Visit     Patient: Lynn Williams, Female    DOB: 1931-11-06, 88 y.o.   MRN: [id number] Visit Date: 07/02/2022  Today's Provider: [name], Williams   Chief Complaint  Patient presents with   Annual Exam   Subjective    Lynn Williams is a 88 y.o. female who presents today for her Annual Wellness Visit. She reports consuming a general diet. Gym/ health club routine includes exercise class 4 times a week. She generally feels well. She reports sleeping well. She does have additional problems to discuss today.    Lower back pain Patient is reports history of osteoporosis.  She states that she has intermittent flares of bilateral lower back pain.  She reports that pain is worse after a long day.  She alleviated his pain by stretching to touch her toes or stretching into the air.  She also reports that her pain is improved with Tylenol.  She avoids medications with aspirin.  She denies any bowel or bladder incontinence.  She denies weakness or numbness in her legs.  She denies night sweats.  She denies unintentional weight loss.  Patient denies any trauma related to her back.  She states that she has had x-rays of her back recently.  Left facial nodule She reports noticing a marble like area on the left side of her face just under her jawline.  She reports that it has been present for about 6 months.  She states that it is not tender.  She has not noticed any changes to the overlying skin.  She denies any dysphagia or sore throat.  She denies fevers, night sweats, unintentional weight loss.  She states that she has not had an issue like this before.  She states that the area enlarges and then shrinks  to normal size intermittently.  She denies any gum pain or gum bleeding.  Left wrist nodule Patient states that over the last few weeks she noticed an enlargement on the Medial side of her left wrist.  She denies any known trauma to the area.  The area is nontender.  It does not limit her ability to move her wrist.  HPI  Medications: Outpatient Medications Prior to Visit  Medication Sig   alendronate (FOSAMAX) 70 MG tablet TAKE 1 TABLET (70 MG TOTAL) BY MOUTH ONCE A WEEK. TAKE WITH A FULL GLASS OF WATER ON AN EMPTY STOMACH.   Calcium-Magnesium-Vitamin D (CALCIUM 500 PO) Take by mouth. am   cetirizine (ZYRTEC) 10 MG tablet Take by mouth as needed.    hydrochlorothiazide (HYDRODIURIL) 25 MG tablet TAKE 1 TABLET EVERY DAY   levothyroxine (SYNTHROID) 75 MCG tablet TAKE 1 TABLET EVERY DAY BEFORE BREAKFAST   losartan (COZAAR) 100 MG tablet TAKE 1 TABLET EVERY DAY   Multiple Vitamins-Minerals (MULTIVITAMIN ADULT PO) Take by mouth. am   simvastatin (ZOCOR) 20 MG tablet TAKE 1 TABLET AT BEDTIME   No facility-administered medications prior to visit.    Allergies  Allergen Reactions   Sulfa Antibiotics Itching    Patient Care Team: Lynn Williams as PCP - General (Family Medicine) Lynn Robson, Williams as Referring Physician (Ophthalmology)  Review of Systems      Objective    Vitals: BP (!) 140/88 (BP Location: Left  Arm, Patient Position: Sitting, Cuff Size: Normal)   Pulse 70   Resp 16   Ht 5' (1.524 m)   Wt 157 lb 3.2 oz (71.3 kg)   LMP  (LMP Unknown)   BMI 30.70 kg/m     Physical Exam Vitals reviewed.  Constitutional:      General: She is not in acute distress.    Appearance: Normal appearance. She is not ill-appearing, toxic-appearing or diaphoretic.  HENT:     Head: Normocephalic and atraumatic.     Right Ear: External ear normal. There is impacted cerumen.     Left Ear: External ear normal. There is impacted cerumen.     Nose: Nose normal.      Mouth/Throat:     Pharynx: Oropharynx is clear.  Eyes:     General: No scleral icterus.    Extraocular Movements: Extraocular movements intact.     Conjunctiva/sclera: Conjunctivae normal.     Pupils: Pupils are equal, round, and reactive to light.  Neck:     Comments: Nontender palpable left enlarged LN  No overlying erythema, no swelling Cardiovascular:     Rate and Rhythm: Normal rate and regular rhythm.     Pulses:          Radial pulses are 2+ on the right side and 1+ on the left side.     Heart sounds: Normal heart sounds. No murmur heard.    No friction rub. No gallop.  Pulmonary:     Effort: Pulmonary effort is normal. No respiratory distress.     Breath sounds: Normal breath sounds. No wheezing, rhonchi or rales.  Abdominal:     General: Bowel sounds are normal. There is no distension.     Palpations: Abdomen is soft. There is no mass.     Tenderness: There is no abdominal tenderness. There is no guarding.  Musculoskeletal:        General: No deformity.     Right wrist: No swelling, tenderness or bony tenderness. Decreased range of motion. Normal pulse.     Left wrist: Swelling present. No tenderness or bony tenderness. Decreased range of motion.     Right hand: Normal. No tenderness or bony tenderness. Normal pulse.     Left hand: Normal. No tenderness or bony tenderness. Normal pulse.     Cervical back: Normal range of motion and neck supple. No rigidity.     Lumbar back: Normal. No spasms, tenderness or bony tenderness. Negative right straight leg raise test and negative left straight leg raise test.     Right lower leg: No edema.     Left lower leg: No edema.     Comments: 5/5 grip strength  5/5 strength in bilateral upper and lower extremities   Lymphadenopathy:     Cervical: Cervical adenopathy present.     Left cervical: Superficial cervical adenopathy present.  Skin:    General: Skin is warm.     Capillary Refill: Capillary refill takes less than 2 seconds.      Findings: No erythema or rash.  Neurological:     General: No focal deficit present.     Mental Status: She is alert and oriented to person, place, and time.     Motor: No weakness.     Gait: Gait normal.  Psychiatric:        Mood and Affect: Mood normal.        Behavior: Behavior normal.     Most recent functional status assessment:  07/02/2022    8:20 AM  In your present state of health, do you have any difficulty performing the following activities:  Hearing? 0  Vision? 0  Difficulty concentrating or making decisions? 0  Walking or climbing stairs? 0  Dressing or bathing? 0  Doing errands, shopping? 0   Most recent fall risk assessment:    07/02/2022    8:20 AM  Fall Risk   Falls in the past year? 0  Number falls in past yr: 0  Injury with Fall? 0  Risk for fall due to : No Fall Risks    Most recent depression screenings:    07/02/2022    8:20 AM 12/25/2021    8:22 AM  PHQ 2/9 Scores  PHQ - 2 Score 0 0  PHQ- 9 Score 2 3   Most recent cognitive screening:    07/02/2022    8:24 AM  6CIT Screen  What Year? 0 points  What month? 0 points  What time? 0 points  Count back from 20 0 points  Months in reverse 0 points  Repeat phrase 0 points  Total Score 0 points   Most recent Audit-C alcohol use screening    07/02/2022    8:21 AM  Alcohol Use Disorder Test (AUDIT)  1. How often do you have a drink containing alcohol? 0  2. How many drinks containing alcohol do you have on a typical day when you are drinking? 0  3. How often do you have six or more drinks on one occasion? 0  AUDIT-C Score 0   A score of 3 or more in women, and 4 or more in men indicates increased risk for alcohol abuse, EXCEPT if all of the points are from question 1   No results found for any visits on 07/02/22.  Assessment & Plan     Annual wellness visit done today including the all of the following: Reviewed patient's Family Medical History Reviewed and updated list of  patient's medical providers Assessment of cognitive impairment was done Assessed patient's functional ability Established a written schedule for health screening [place] Completed and Reviewed  Exercise Activities and Dietary recommendations  Goals      DIET - REDUCE SUGAR INTAKE     Recommend cutting out all sugar in diet to help aid in weight loss and decrease BMI.     Increase water intake     Starting 08/28/16, I will increase my water intake to 5 glasses a day.        Immunization History  Administered Date(s) Administered   Fluad Quad(high Dose 65+) 05/07/2019, 06/15/2020, 05/02/2021, 07/02/2022   Influenza, High Dose Seasonal PF 05/04/2015, 05/14/2016, 05/08/2017, 04/03/2018   PFIZER(Purple Top)SARS-COV-2 Vaccination 08/18/2019, 09/08/2019, 06/02/2020   Pneumococcal Conjugate-13 12/23/2013   Pneumococcal Polysaccharide-23 07/24/1996, 08/04/2003    Health Maintenance  Topic Date Due   DEXA SCAN  04/04/2022   COVID-19 Vaccine (4 - 2023-24 season) 07/18/2022 (Originally 03/23/2022)   Zoster Vaccines- Shingrix (1 of 2) 10/01/2022 (Originally 05/07/1951)   Medicare Annual Wellness (AWV)  07/03/2023   Pneumonia Vaccine 55+ Years old  Completed   INFLUENZA VACCINE  Completed   HPV VACCINES  Aged Out   DTaP/Tdap/Td  Discontinued     Discussed health benefits of physical activity, and encouraged her to engage in regular exercise appropriate for her age and condition.    Problem List Items Addressed This Visit       Cardiovascular and Mediastinum   Essential (primary)  hypertension    Blood pressure initially 148/80. Patient reports adherence to hydrochlorothiazide and losartan prescriptions States that she has not had her blood pressure medications for today We will check CMP today       Relevant Orders   Comprehensive metabolic panel     Endocrine   Adult hypothyroidism    Stable  Will check TSH and free T4       Relevant Orders   TSH  + free T4     Immune and Lymphatic   Enlarged lymph node    Intermittently enlarged, nontender  Negative report of B symptoms  Will check CBC today  Schedule follow up in 4 weeks to see if LN still enlarged and determine next steps        Relevant Orders   CBC     Other   Hypercholesteremia    Stable on simvastatin  Fasting lipid panel collected today       Relevant Orders   Lipid panel   Medicare annual wellness visit, subsequent - Primary    DEXA scan ordered  Lipid panel ordered  Influenza vaccine given        Relevant Orders   DG Bone Density   Ganglion cyst of volar aspect of left wrist    Recommended monitoring  Discussed expected resolution with time otherwise can refer to surgery but reoccurrence is common  Patient declines surgery referral due to advanced age        Need for influenza vaccination    Influenza vaccine was administered today,Patient tolerated well        Relevant Orders   Flu Vaccine QUAD High Dose(Fluad) (Completed)   Chronic bilateral low back pain without sciatica    Intermittently lower back pain  Worse at the end of day, likely related to OA Reviewed prior lumbar XR showing lumbar spondylosis with interval progression & mild retrolisthesis at L1-L2 and L2-L3 levels (June 2023) Recommended continued lidocaine patches in addition to heat therapy in 20 mins intervals and monitoring for development of red flag symptoms,see AVS  Recommended continued use of Tylenol PRN          Return in about 1 month (around 08/02/2022) for enlarged lymph node .     I, Lynn Williams, have reviewed all documentation for this visit.  Portions of this information were initially documented by the CMA and reviewed by me for thoroughness and accuracy.    Lynn Williams  [facility] [phone] (phone) [phone] (fax)  [place]

## 2022-07-02 ENCOUNTER — Ambulatory Visit (INDEPENDENT_AMBULATORY_CARE_PROVIDER_SITE_OTHER): Payer: Medicare HMO | Admitting: Family Medicine

## 2022-07-02 ENCOUNTER — Encounter: Payer: Self-pay | Admitting: Family Medicine

## 2022-07-02 ENCOUNTER — Encounter: Payer: Medicare HMO | Admitting: Family Medicine

## 2022-07-02 VITALS — BP 140/88 | HR 70 | Resp 16 | Ht 60.0 in | Wt 157.2 lb

## 2022-07-02 DIAGNOSIS — E039 Hypothyroidism, unspecified: Secondary | ICD-10-CM

## 2022-07-02 DIAGNOSIS — M545 Low back pain, unspecified: Secondary | ICD-10-CM | POA: Diagnosis not present

## 2022-07-02 DIAGNOSIS — Z0000 Encounter for general adult medical examination without abnormal findings: Secondary | ICD-10-CM | POA: Diagnosis not present

## 2022-07-02 DIAGNOSIS — M67432 Ganglion, left wrist: Secondary | ICD-10-CM

## 2022-07-02 DIAGNOSIS — E7800 Pure hypercholesterolemia, unspecified: Secondary | ICD-10-CM

## 2022-07-02 DIAGNOSIS — I10 Essential (primary) hypertension: Secondary | ICD-10-CM

## 2022-07-02 DIAGNOSIS — G8929 Other chronic pain: Secondary | ICD-10-CM | POA: Insufficient documentation

## 2022-07-02 DIAGNOSIS — R599 Enlarged lymph nodes, unspecified: Secondary | ICD-10-CM | POA: Insufficient documentation

## 2022-07-02 DIAGNOSIS — Z23 Encounter for immunization: Secondary | ICD-10-CM | POA: Insufficient documentation

## 2022-07-02 NOTE — Assessment & Plan Note (Signed)
Intermittently enlarged, nontender  Negative report of B symptoms  Will check CBC today  Schedule follow up in 4 weeks to see if LN still enlarged and determine next steps

## 2022-07-02 NOTE — Assessment & Plan Note (Signed)
Intermittently lower back pain  Worse at the end of day, likely related to OA Reviewed prior lumbar XR showing lumbar spondylosis with interval progression & mild retrolisthesis at L1-L2 and L2-L3 levels (June 2023) Recommended continued lidocaine patches in addition to heat therapy in 20 mins intervals and monitoring for development of red flag symptoms,see AVS  Recommended continued use of Tylenol PRN

## 2022-07-02 NOTE — Assessment & Plan Note (Signed)
Recommended monitoring  Discussed expected resolution with time otherwise can refer to surgery but reoccurrence is common  Patient declines surgery referral due to advanced age

## 2022-07-02 NOTE — Assessment & Plan Note (Signed)
Influenza vaccine was administered today,Patient tolerated well

## 2022-07-02 NOTE — Assessment & Plan Note (Signed)
Blood pressure initially 148/80. Patient reports adherence to hydrochlorothiazide and losartan prescriptions States that she has not had her blood pressure medications for today We will check CMP today

## 2022-07-02 NOTE — Assessment & Plan Note (Signed)
DEXA scan ordered  Lipid panel ordered  Influenza vaccine given

## 2022-07-02 NOTE — Patient Instructions (Addendum)
For your back, please apply a heating pad for 20 min intervals on evenings where pain is increased.   You can continue Tylenol as needed for pain.   Notify our office if you develop weakness or numbness in your legs, inability to control your bowels or bladder, night sweats or begin to lose weight without trying.  We will monitor your lab work to see if there are any signs that could be related to the enlarged lymph node.  I believe the area on your wrist is due to a ganglion cyst.  As long as this does not cause you any pain or limited range of motion as compared to normal, I will recommend monitoring this over the next few weeks to months for resolution.     Health Maintenance for Postmenopausal Women Menopause is a normal process in which your ability to get pregnant comes to an end. This process happens slowly over many months or years, usually between the ages of 95 and 8. Menopause is complete when you have missed your menstrual period for 12 months. It is important to talk with your health care provider about some of the most common conditions that affect women after menopause (postmenopausal women). These include heart disease, cancer, and bone loss (osteoporosis). Adopting a healthy lifestyle and getting preventive care can help to promote your health and wellness. The actions you take can also lower your chances of developing some of these common conditions. What are the signs and symptoms of menopause? During menopause, you may have the following symptoms: Hot flashes. These can be moderate or severe. Night sweats. Decrease in sex drive. Mood swings. Headaches. Tiredness (fatigue). Irritability. Memory problems. Problems falling asleep or staying asleep. Talk with your health care provider about treatment options for your symptoms. Do I need hormone replacement therapy? Hormone replacement therapy is effective in treating symptoms that are caused by menopause, such as hot  flashes and night sweats. Hormone replacement carries certain risks, especially as you become older. If you are thinking about using estrogen or estrogen with progestin, discuss the benefits and risks with your health care provider. How can I reduce my risk for heart disease and stroke? The risk of heart disease, heart attack, and stroke increases as you age. One of the causes may be a change in the body's hormones during menopause. This can affect how your body uses dietary fats, triglycerides, and cholesterol. Heart attack and stroke are medical emergencies. There are many things that you can do to help prevent heart disease and stroke. Watch your blood pressure High blood pressure causes heart disease and increases the risk of stroke. This is more likely to develop in people who have high blood pressure readings or are overweight. Have your blood pressure checked: Every 3-5 years if you are 63-42 years of age. Every year if you are 84 years old or older. Eat a healthy diet  Eat a diet that includes plenty of vegetables, fruits, low-fat dairy products, and lean protein. Do not eat a lot of foods that are high in solid fats, added sugars, or sodium. Get regular exercise Get regular exercise. This is one of the most important things you can do for your health. Most adults should: Try to exercise for at least 150 minutes each week. The exercise should increase your heart rate and make you sweat (moderate-intensity exercise). Try to do strengthening exercises at least twice each week. Do these in addition to the moderate-intensity exercise. Spend less time sitting. Even light  physical activity can be beneficial. Other tips Work with your health care provider to achieve or maintain a healthy weight. Do not use any products that contain nicotine or tobacco. These products include cigarettes, chewing tobacco, and vaping devices, such as e-cigarettes. If you need help quitting, ask your health care  provider. Know your numbers. Ask your health care provider to check your cholesterol and your blood sugar (glucose). Continue to have your blood tested as directed by your health care provider. Do I need screening for cancer? Depending on your health history and family history, you may need to have cancer screenings at different stages of your life. This may include screening for: Breast cancer. Cervical cancer. Lung cancer. Colorectal cancer. What is my risk for osteoporosis? After menopause, you may be at increased risk for osteoporosis. Osteoporosis is a condition in which bone destruction happens more quickly than new bone creation. To help prevent osteoporosis or the bone fractures that can happen because of osteoporosis, you may take the following actions: If you are 25-37 years old, get at least 1,000 mg of calcium and at least 600 international units (IU) of vitamin D per day. If you are older than age 13 but younger than age 57, get at least 1,200 mg of calcium and at least 600 international units (IU) of vitamin D per day. If you are older than age 62, get at least 1,200 mg of calcium and at least 800 international units (IU) of vitamin D per day. Smoking and drinking excessive alcohol increase the risk of osteoporosis. Eat foods that are rich in calcium and vitamin D, and do weight-bearing exercises several times each week as directed by your health care provider. How does menopause affect my mental health? Depression may occur at any age, but it is more common as you become older. Common symptoms of depression include: Feeling depressed. Changes in sleep patterns. Changes in appetite or eating patterns. Feeling an overall lack of motivation or enjoyment of activities that you previously enjoyed. Frequent crying spells. Talk with your health care provider if you think that you are experiencing any of these symptoms. General instructions See your health care provider for regular  wellness exams and vaccines. This may include: Scheduling regular health, dental, and eye exams. Getting and maintaining your vaccines. These include: Influenza vaccine. Get this vaccine each year before the flu season begins. Pneumonia vaccine. Shingles vaccine. Tetanus, diphtheria, and pertussis (Tdap) booster vaccine. Your health care provider may also recommend other immunizations. Tell your health care provider if you have ever been abused or do not feel safe at home. Summary Menopause is a normal process in which your ability to get pregnant comes to an end. This condition causes hot flashes, night sweats, decreased interest in sex, mood swings, headaches, or lack of sleep. Treatment for this condition may include hormone replacement therapy. Take actions to keep yourself healthy, including exercising regularly, eating a healthy diet, watching your weight, and checking your blood pressure and blood sugar levels. Get screened for cancer and depression. Make sure that you are up to date with all your vaccines. This information is not intended to replace advice given to you by your health care provider. Make sure you discuss any questions you have with your health care provider. Document Revised: 11/28/2020 Document Reviewed: 11/28/2020 Elsevier Patient Education  [place].

## 2022-07-02 NOTE — Assessment & Plan Note (Signed)
Stable on simvastatin  Fasting lipid panel collected today

## 2022-07-02 NOTE — Assessment & Plan Note (Signed)
Stable  Will check TSH and free T4

## 2022-07-03 LAB — LIPID PANEL
Chol/HDL Ratio: 2.2 ratio (ref 0.0–4.4)
Cholesterol, Total: 138 mg/dL (ref 100–199)
HDL: 63 mg/dL (ref >39)
LDL Chol Calc (NIH): 62 mg/dL (ref 0–99)
Triglycerides: 64 mg/dL (ref 0–149)
VLDL Cholesterol Cal: 13 mg/dL (ref 5–40)

## 2022-07-03 LAB — TSH+FREE T4
Free T4: 1.71 ng/dL (ref 0.82–1.77)
TSH: 0.394 u[IU]/mL — ABNORMAL LOW (ref 0.450–4.500)

## 2022-07-03 LAB — COMPREHENSIVE METABOLIC PANEL
ALT: 13 IU/L (ref 0–32)
AST: 24 IU/L (ref 0–40)
Albumin/Globulin Ratio: 1.8 (ref 1.2–2.2)
Albumin: 4.3 g/dL (ref 3.7–4.7)
Alkaline Phosphatase: 67 IU/L (ref 44–121)
BUN/Creatinine Ratio: 12 (ref 12–28)
BUN: 12 mg/dL (ref 8–27)
Bilirubin Total: 0.4 mg/dL (ref 0.0–1.2)
CO2: 29 mmol/L (ref 20–29)
Calcium: 10.1 mg/dL (ref 8.7–10.3)
Chloride: 96 mmol/L (ref 96–106)
Creatinine, Ser: 0.97 mg/dL (ref 0.57–1.00)
Globulin, Total: 2.4 g/dL (ref 1.5–4.5)
Glucose: 90 mg/dL (ref 70–99)
Potassium: 3.7 mmol/L (ref 3.5–5.2)
Sodium: 136 mmol/L (ref 134–144)
Total Protein: 6.7 g/dL (ref 6.0–8.5)
eGFR: 56 mL/min/{1.73_m2} — ABNORMAL LOW (ref >59)

## 2022-07-03 LAB — CBC
Hematocrit: 34.1 % (ref 34.0–46.6)
Hemoglobin: 11.7 g/dL (ref 11.1–15.9)
MCH: 29.5 pg (ref 26.6–33.0)
MCHC: 34.3 g/dL (ref 31.5–35.7)
MCV: 86 fL (ref 79–97)
Platelets: 307 10*3/uL (ref 150–450)
RBC: 3.97 x10E6/uL (ref 3.77–5.28)
RDW: 11.9 % (ref 11.7–15.4)
WBC: 5.5 10*3/uL (ref 3.4–10.8)

## 2022-07-30 ENCOUNTER — Other Ambulatory Visit: Payer: Self-pay | Admitting: Family Medicine

## 2022-07-30 DIAGNOSIS — M810 Age-related osteoporosis without current pathological fracture: Secondary | ICD-10-CM

## 2022-07-30 MED ORDER — ALENDRONATE SODIUM 70 MG PO TABS: 70.0000 mg | ORAL_TABLET | ORAL | 1 refills | Status: DC

## 2022-07-30 NOTE — Telephone Encounter (Signed)
[facility] faxed refill request for the following medications:   alendronate (FOSAMAX) 70 MG tablet    Please advise

## 2022-08-08 ENCOUNTER — Telehealth: Payer: Self-pay | Admitting: Family Medicine

## 2022-08-08 NOTE — Telephone Encounter (Signed)
[facility] faxed refill request for the following medications:  pantoprazole (PROTONIX) 40 MG tablet   Please advise.

## 2022-08-08 NOTE — Telephone Encounter (Signed)
Do not see the Pantoprazole on patient's med list

## 2022-08-09 MED ORDER — PANTOPRAZOLE SODIUM 40 MG PO TBEC: 40.0000 mg | DELAYED_RELEASE_TABLET | Freq: Every day | ORAL | 5 refills | Status: DC

## 2022-08-09 NOTE — Telephone Encounter (Signed)
Please call patient to determine if she is still taking the pantoprazole and if yes, how often.   [name], MD  [facility]

## 2022-08-09 NOTE — Addendum Note (Signed)
Addended by: Adolph Pollack on: 08/09/2022 04:35 PM   Modules accepted: Orders

## 2022-08-09 NOTE — Telephone Encounter (Signed)
Pantoprazole submitted to patient's pharmacy for '40mg'$  by mouth once daily   [name], MD  [facility]

## 2022-08-14 NOTE — Progress Notes (Unsigned)
I,[name],acting as a scribe for [name], MD.,have documented all relevant documentation on the behalf of [name], MD,as directed by  [name], MD while in the presence of [name], MD.   Established patient visit   Patient: Lynn Williams   DOB: 1932/03/22   89 y.o. Female  MRN: [id number] Visit Date: 08/15/2022  Today's healthcare provider: [name], MD   Chief Complaint  Patient presents with   Follow-up   Subjective    HPI   Enlarged LN  Patient coming in to follow up on enlarged lymph node. She states that the LN feels smaller. She continues to deny fever, chills, dysphagia, facial swelling, tenderness of the LN.   She has not noticed any other areas of enlargement   HTN  Patient states that she has taken one of her BP meds this am but is not sure which  She places her medications in an organizer for AM, lunch time and evening meds  She does not check BP at home  She denies HA, Chest pain, dyspnea vision changes  She denies symptoms of hypotension after taking her medications    Medications: Outpatient Medications Prior to Visit  Medication Sig   alendronate (FOSAMAX) 70 MG tablet Take 1 tablet (70 mg total) by mouth once a week. Take with a full glass of water on an empty stomach.   Calcium-Magnesium-Vitamin D (CALCIUM 500 PO) Take by mouth. am   cetirizine (ZYRTEC) 10 MG tablet Take by mouth as needed.    hydrochlorothiazide (HYDRODIURIL) 25 MG tablet TAKE 1 TABLET EVERY DAY   levothyroxine (SYNTHROID) 75 MCG tablet TAKE 1 TABLET EVERY DAY BEFORE BREAKFAST   losartan (COZAAR) 100 MG tablet TAKE 1 TABLET EVERY DAY   Multiple Vitamins-Minerals (MULTIVITAMIN ADULT PO) Take by mouth. am   pantoprazole (PROTONIX) 40 MG tablet Take 1 tablet (40 mg total) by mouth daily.   simvastatin (ZOCOR) 20 MG tablet TAKE 1 TABLET AT BEDTIME   No facility-administered medications prior  to visit.    Review of Systems     Objective    BP (!) 144/94   Pulse 68   Temp 98.4 F (36.9 C) (Oral)   Resp 16   Wt 159 lb 9.6 oz (72.4 kg)   LMP  (LMP Unknown)   BMI 31.17 kg/m    Physical Exam Vitals reviewed.  Constitutional:      General: She is not in acute distress.    Appearance: Normal appearance. She is not ill-appearing, toxic-appearing or diaphoretic.  Eyes:     Conjunctiva/sclera: Conjunctivae normal.  Neck:     Comments: LN has decreased in size and is soft, nontender  Cardiovascular:     Rate and Rhythm: Normal rate and regular rhythm.     Pulses: Normal pulses.     Heart sounds: Normal heart sounds. No murmur heard.    No friction rub. No gallop.  Pulmonary:     Effort: Pulmonary effort is normal. No respiratory distress.     Breath sounds: Normal breath sounds. No stridor. No wheezing, rhonchi or rales.  Abdominal:     General: Bowel sounds are normal. There is no distension.     Palpations: Abdomen is soft.     Tenderness: There is no abdominal tenderness.  Musculoskeletal:     Right lower leg: No edema.     Left lower leg: No edema.  Skin:    Findings: No erythema or rash.  Neurological:  Mental Status: She is alert and oriented to person, place, and time.      No results found for any visits on 08/15/22.  Assessment & Plan     Problem List Items Addressed This Visit       Cardiovascular and Mediastinum   Essential (primary) hypertension - Primary    BP elevated on multiple measurements  Goal for this patient would be less than 150/90 given advanced age  Recommended checking BP 1-2 hours after BP medications for next few weeks, aiming for 3-4 checks per week  She will follow up in 3 months  She will continue losartan '100mg'$  and HCTZ '25mg'$  daily          Immune and Lymphatic   Enlarged lymph node    Resolved  She continues to be asymptomatic  LN has decreased in size and is nontender on exam  Weight is increased 2 lbs since  last visit  Will monitor as needed  Discussed symptoms to be on the lookout for including unintentional weight loss, fevers, chills, dysphagia or facial swelling or if the LN enlarges and becomes painful, patient voiced understanding        Return in about 3 months (around 11/14/2022) for HTN .        The entirety of the information documented in the History of Present Illness, Review of Systems and Physical Exam were personally obtained by me. Portions of this information were initially documented by Lyndel Pleasure, CMA and reviewed by me for thoroughness and accuracy.[name], MD     [name], MD  [facility] [phone] (phone) [phone] (fax)  [place]

## 2022-08-15 ENCOUNTER — Ambulatory Visit (INDEPENDENT_AMBULATORY_CARE_PROVIDER_SITE_OTHER): Payer: Medicare HMO | Admitting: Family Medicine

## 2022-08-15 ENCOUNTER — Encounter: Payer: Self-pay | Admitting: Family Medicine

## 2022-08-15 VITALS — BP 144/94 | HR 68 | Temp 98.4°F | Resp 16 | Wt 159.6 lb

## 2022-08-15 DIAGNOSIS — R599 Enlarged lymph nodes, unspecified: Secondary | ICD-10-CM

## 2022-08-15 DIAGNOSIS — I10 Essential (primary) hypertension: Secondary | ICD-10-CM | POA: Diagnosis not present

## 2022-08-15 NOTE — Assessment & Plan Note (Signed)
Resolved  She continues to be asymptomatic  LN has decreased in size and is nontender on exam  Weight is increased 2 lbs since last visit  Will monitor as needed  Discussed symptoms to be on the lookout for including unintentional weight loss, fevers, chills, dysphagia or facial swelling or if the LN enlarges and becomes painful, patient voiced understanding

## 2022-08-15 NOTE — Patient Instructions (Addendum)
Your blood pressure was elevated today  Our goal is to have your blood pressure less than 150/90 consistently   Please continue to take your blood pressure medications as prescribed and check your blood pressure 3-4 times per week.   I would like you to follow up with me in 3 months for your blood pressure    [facility]  Tips to measure your blood pressure correctly  To determine whether you have hypertension, a medical professional will take a blood pressure reading. How you prepare for the test, the position of your arm, and other factors can change a blood pressure reading by 10% or more. That could be enough to hide high blood pressure, start you on a drug you don't really need, or lead your doctor to incorrectly adjust your medications. National and international guidelines offer specific instructions for measuring blood pressure. If a doctor, nurse, or medical assistant isn't doing it right, don't hesitate to ask him or her to get with the guidelines. Here's what you can do to ensure a correct reading:  Don't drink a caffeinated beverage or smoke during the 30 minutes before the test.  Sit quietly for five minutes before the test begins.  During the measurement, sit in a chair with your feet on the floor and your arm supported so your elbow is at about heart level.  The inflatable part of the cuff should completely cover at least 80% of your upper arm, and the cuff should be placed on bare skin, not over a shirt.  Don't talk during the measurement.  Have your blood pressure measured twice, with a brief break in between. If the readings are different by 5 points or more, have it done a third time. There are times to break these rules. If you sometimes feel lightheaded when getting out of bed in the morning or when you stand after sitting, you should have your blood pressure checked while seated and then while standing to see if it falls from one position to the next. Because blood pressure  varies throughout the day, your doctor will rarely diagnose hypertension on the basis of a single reading. Instead, he or she will want to confirm the measurements on at least two occasions, usually within a few weeks of one another. The exception to this rule is if you have a blood pressure reading of 180/110 mm Hg or higher. A result this high usually calls for prompt treatment. It's also a good idea to have your blood pressure measured in both arms at least once, since the reading in one arm (usually the right) may be higher than that in the left. A 2014 study in The American Journal of Medicine of nearly 3,400 people found average arm- to-arm differences in systolic blood pressure of about 5 points. The higher number should be used to make treatment decisions. In 2017, new guidelines from the [place], the [employer] of Cardiology, and nine other health organizations lowered the diagnosis of high blood pressure to 130/80 mm Hg or higher for all adults. The guidelines also redefined the various blood pressure categories to now include normal, elevated, Stage 1 hypertension, Stage 2 hypertension, and hypertensive crisis (see "Blood pressure categories"). Blood pressure categories  Blood pressure category SYSTOLIC (upper number)  DIASTOLIC (lower number)  Normal Less than 120 mm Hg and Less than 80 mm Hg  Elevated 120-129 mm Hg and Less than 80 mm Hg  High blood pressure: Stage 1 hypertension 130-139 mm Hg or  80-89 mm Hg  High blood pressure: Stage 2 hypertension 140 mm Hg or higher or 90 mm Hg or higher  Hypertensive crisis (consult your doctor immediately) Higher than 180 mm Hg and/or Higher than 120 mm Hg  Source: American Heart Association and American Stroke Association. For more on getting your blood pressure under control, buy Controlling Your Blood Pressure, a Special Health Report from  [facility].

## 2022-08-15 NOTE — Assessment & Plan Note (Signed)
BP elevated on multiple measurements  Goal for this patient would be less than 150/90 given advanced age  Recommended checking BP 1-2 hours after BP medications for next few weeks, aiming for 3-4 checks per week  She will follow up in 3 months  She will continue losartan '100mg'$  and HCTZ '25mg'$  daily

## 2022-10-24 ENCOUNTER — Other Ambulatory Visit: Payer: Self-pay | Admitting: Family Medicine

## 2022-10-24 DIAGNOSIS — I10 Essential (primary) hypertension: Secondary | ICD-10-CM

## 2022-10-24 MED ORDER — SIMVASTATIN 20 MG PO TABS: 20.0000 mg | ORAL_TABLET | Freq: Every day | ORAL | 2 refills | Status: DC

## 2022-10-24 MED ORDER — LOSARTAN POTASSIUM 100 MG PO TABS: 100.0000 mg | ORAL_TABLET | Freq: Every day | ORAL | 1 refills | Status: DC

## 2022-11-20 NOTE — Progress Notes (Unsigned)
I,Sulibeya S Dimas,acting as a Neurosurgeon for [employer], MD.,have documented all relevant documentation on the behalf of Ronnald Ramp, MD,as directed by  Ronnald Ramp, MD while in the presence of Ronnald Ramp, MD.     Established patient visit   Patient: Lynn Williams   DOB: 07-14-1932   89 y.o. Female  MRN: [id number] Visit Date: 11/21/2022  Today's healthcare provider: Ronnald Ramp, MD   Chief Complaint  Patient presents with   Hypertension   Hypothyroidism   Subjective    HPI  Hypertension, follow-up  BP Readings from Last 3 Encounters:  11/21/22 133/75  08/15/22 (!) 144/94  07/02/22 (!) 140/88   Wt Readings from Last 3 Encounters:  11/21/22 158 lb (71.7 kg)  08/15/22 159 lb 9.6 oz (72.4 kg)  07/02/22 157 lb 3.2 oz (71.3 kg)     She was last seen for hypertension 3 months ago.  BP at that visit was 144/94. Management since that visit includes continue losartan 100mg  and HCTZ 25mg  daily .  She reports excellent compliance with treatment. She is not having side effects.   Outside blood pressures are stable.  Pertinent labs Lab Results  Component Value Date   CHOL 138 07/02/2022   HDL 63 07/02/2022   LDLCALC 62 07/02/2022   TRIG 64 07/02/2022   CHOLHDL 2.2 07/02/2022   Lab Results  Component Value Date   NA 136 07/02/2022   K 3.7 07/02/2022   CREATININE 0.97 07/02/2022   EGFR 56 (L) 07/02/2022   GLUCOSE 90 07/02/2022   TSH 0.394 (L) 07/02/2022     The ASCVD Risk score (Arnett DK, et al., 2019) failed to calculate for the following reasons:   The 2019 ASCVD risk score is only valid for ages 60 to 38  ---------------------------------------------------------------------------------------------------   Hypothyroidism Lynn Williams is a 89 y.o. female who presents for follow up of hypothyroidism. Current symptoms: cold/heat intolerance . Patient denies diarrhea. Symptoms have  stabilized, she continues to report adherence to her synthroid .  Lab Results  Component Value Date   TSH 0.394 (L) 07/02/2022   Back Pain:  Patient is requesting if there is any therapy she can do for her back.  She states that she has intermittent lower back pain bilaterally that is sometimes associated with right buttock burning and pain that does not radiate.  She states that she normally takes arthritis Tylenol for this and states that it does ease the pain.  She prefers to avoid any NSAIDs.   Medications: Outpatient Medications Prior to Visit  Medication Sig   alendronate (FOSAMAX) 70 MG tablet Take 1 tablet (70 mg total) by mouth once a week. Take with a full glass of water on an empty stomach.   Calcium-Magnesium-Vitamin D (CALCIUM 500 PO) Take by mouth. am   cetirizine (ZYRTEC) 10 MG tablet Take by mouth as needed.    hydrochlorothiazide (HYDRODIURIL) 25 MG tablet TAKE 1 TABLET EVERY DAY   levothyroxine (SYNTHROID) 75 MCG tablet TAKE 1 TABLET EVERY DAY BEFORE BREAKFAST   losartan (COZAAR) 100 MG tablet Take 1 tablet (100 mg total) by mouth daily.   Multiple Vitamins-Minerals (MULTIVITAMIN ADULT PO) Take by mouth. am   pantoprazole (PROTONIX) 40 MG tablet Take 1 tablet (40 mg total) by mouth daily.   simvastatin (ZOCOR) 20 MG tablet Take 1 tablet (20 mg total) by mouth at bedtime.   No facility-administered medications prior to visit.    Review of Systems  Constitutional:  Negative for appetite change.  Eyes:  Negative for visual disturbance.  Respiratory:  Positive for cough and shortness of breath. Negative for chest tightness.   Cardiovascular:  Positive for leg swelling. Negative for chest pain.  Gastrointestinal:  Negative for abdominal pain, constipation, diarrhea, nausea and vomiting.  Endocrine: Positive for cold intolerance and heat intolerance.  Neurological:  Negative for dizziness, light-headedness and headaches.       Objective    BP 133/75 (BP  Location: Left Arm, Patient Position: Sitting, Cuff Size: Large)   Pulse 78   Temp 97.8 F (36.6 C) (Temporal)   Resp (!) 24   Wt 158 lb (71.7 kg)   LMP  (LMP Unknown)   SpO2 97%   BMI 30.86 kg/m    Physical Exam Vitals reviewed.  Constitutional:      General: She is not in acute distress.    Appearance: Normal appearance. She is not ill-appearing, toxic-appearing or diaphoretic.  Eyes:     Conjunctiva/sclera: Conjunctivae normal.  Cardiovascular:     Rate and Rhythm: Normal rate and regular rhythm.     Pulses: Normal pulses.     Heart sounds: Normal heart sounds. No murmur heard.    No friction rub. No gallop.  Pulmonary:     Effort: Pulmonary effort is normal. No respiratory distress.     Breath sounds: Normal breath sounds. No stridor. No wheezing, rhonchi or rales.  Abdominal:     General: Bowel sounds are normal. There is no distension.     Palpations: Abdomen is soft.     Tenderness: There is no abdominal tenderness.  Musculoskeletal:     Lumbar back: Tenderness present. No edema or signs of trauma. Negative right straight leg raise test and negative left straight leg raise test.     Right lower leg: No edema.     Left lower leg: No edema.     Comments: Tenderness in bilateral lumbar regions  Skin:    Findings: No erythema or rash.  Neurological:     Mental Status: She is alert and oriented to person, place, and time.        No results found for any visits on 11/21/22.  Assessment & Plan     Problem List Items Addressed This Visit       Cardiovascular and Mediastinum   Essential (primary) hypertension    Controlled BP at goal Continue Losartan 100mg  daily & hctz 25mg  daily No medications changes today          Endocrine   Adult hypothyroidism - Primary    Chronic problem  Last TSH was low  Will repeat TSH and T4 today  Continue levothyroxine No medication changes today          Relevant Orders   TSH + free T4     Other   Chronic  bilateral low back pain without sciatica    Chronic problem  Intermittent painful episodes  Known hx of arthritis  Will have patient go to imaging center for updated xrays of the lumbar region  Offered short course of NSAIDS, patient would prefer to continue tylenol  She also reports some neuropathic pain on the right  Will do trial of gabapentin 100mg  at bed time for increased symptoms of nerve pain  F/u PRN       Relevant Medications   gabapentin (NEURONTIN) 100 MG capsule   Other Relevant Orders   DG Lumbar Spine Complete     Return in about 7 months (around 07/07/2023) for AWV.  The entirety of the information documented in the History of Present Illness, Review of Systems and Physical Exam were personally obtained by me. Portions of this information were initially documented by [facility]. I, Ronnald Ramp, MD have reviewed the documentation above for thoroughness and accuracy.   Ronnald Ramp, MD  [facility] [phone] (phone) [phone] (fax)  [facility]

## 2022-11-21 ENCOUNTER — Encounter: Payer: Self-pay | Admitting: Family Medicine

## 2022-11-21 ENCOUNTER — Ambulatory Visit
Admission: RE | Admit: 2022-11-21 | Discharge: 2022-11-21 | Disposition: A | Discharge status: Home / Self Care | Payer: Medicare HMO | Attending: Family Medicine | Admitting: Family Medicine

## 2022-11-21 ENCOUNTER — Ambulatory Visit (INDEPENDENT_AMBULATORY_CARE_PROVIDER_SITE_OTHER): Payer: Medicare HMO | Admitting: Family Medicine

## 2022-11-21 ENCOUNTER — Ambulatory Visit
Admission: RE | Admit: 2022-11-21 | Discharge: 2022-11-21 | Disposition: A | Discharge status: Home / Self Care | Payer: Medicare HMO | Source: Ambulatory Visit | Attending: Family Medicine | Admitting: Family Medicine

## 2022-11-21 VITALS — BP 133/75 | HR 78 | Temp 97.8°F | Resp 24 | Wt 158.0 lb

## 2022-11-21 DIAGNOSIS — M545 Low back pain, unspecified: Secondary | ICD-10-CM | POA: Insufficient documentation

## 2022-11-21 DIAGNOSIS — E039 Hypothyroidism, unspecified: Secondary | ICD-10-CM | POA: Diagnosis not present

## 2022-11-21 DIAGNOSIS — G8929 Other chronic pain: Secondary | ICD-10-CM

## 2022-11-21 DIAGNOSIS — I10 Essential (primary) hypertension: Secondary | ICD-10-CM

## 2022-11-21 MED ORDER — GABAPENTIN 100 MG PO CAPS
100.0000 mg | ORAL_CAPSULE | Freq: Every day | ORAL | 0 refills | Status: DC
Start: 2022-11-21 — End: 2023-07-10

## 2022-11-21 NOTE — Assessment & Plan Note (Addendum)
Controlled BP at goal Continue Losartan 100mg  daily & hctz 25mg  daily No medications changes today

## 2022-11-21 NOTE — Patient Instructions (Addendum)
It was a pleasure to see you today!  Thank you for choosing [facility] for your primary care.   Lynn Williams was seen for blood pressure.   Our plans for today were: We will check your thyroid levels today  For your back please go to the outpatient imaging center for xray  I have prescribed gabapentin 100mg  at bed time if you experience increased burning pains in your lower back   Please report to [facility] located at:  [address]  [place], [place] [address]  You do not need an appointment to have xrays completed.   Our office will follow up with  results once available.    To keep you healthy, please keep in mind the following health maintenance items that you are due for:   COVID vaccine   You should return to our clinic in December for your annual wellness visit or sooner as needed   Best Wishes,   Dr. Roxan Hockey   Zoster Vaccine Injection  What is this medication? ZOSTER VACCINE (ZOS ter vak SEEN) reduces the risk of herpes zoster (shingles). It does not treat shingles. It is still possible to get shingles after receiving the vaccine, but the symptoms may be less severe or not last as long. It works by helping your immune system learn how to fight off a future infection. This medicine may be used for other purposes; ask your health care provider or pharmacist if you have questions. COMMON BRAND NAME(S): [facility] What should I tell my care team before I take this medication? They need to know if you have any of these conditions: Cancer Immune system problems An unusual or allergic reaction to Zoster vaccine, other medications, foods, dyes, or preservatives Pregnant or trying to get pregnant Breastfeeding How should I use this medication? This vaccine is injected into a muscle. It is given by your care team. This vaccine requires 2 doses to get the full benefit. Set a reminder for when your next dose is  due. A copy of Vaccine Information Statements will be given before each vaccination. Be sure to read this information carefully each time. This sheet may change often. Talk to your care team about the use of this vaccine in children. This vaccine is not approved for use in children. Overdosage: If you think you have taken too much of this medicine contact a poison control center or emergency room at once. NOTE: This medicine is only for you. Do not share this medicine with others. What if I miss a dose? Keep appointments for follow-up (booster) doses. It is important not to miss your dose. Call your care team if you are unable to keep an appointment. What may interact with this medication? Medications that suppress your immune system Medications to treat cancer Steroid medications, such as prednisone or cortisone This list may not describe all possible interactions. Give your health care provider a list of all the medicines, herbs, non-prescription drugs, or dietary supplements you use. Also tell them if you smoke, drink alcohol, or use illegal drugs. Some items may interact with your medicine. What should I watch for while using this medication? Visit your care team regularly. This vaccine, like all vaccines, may not fully protect everyone. What side effects may I notice from receiving this medication? Side effects that you should report to your care team as soon as possible: Allergic reactions--skin rash, itching, hives, swelling of the face, lips, tongue, or throat Side effects that usually  do not require medical attention (report these to your care team if they continue or are bothersome): Chills Fatigue Feeling faint or lightheaded Fever Headache Muscle pain Pain, redness, or irritation at injection site This list may not describe all possible side effects. Call your doctor for medical advice about side effects. You may report side effects to FDA at 1-800-FDA-1088. Where should I keep  my medication? This vaccine is only given by your care team. It will not be stored at home. NOTE: This sheet is a summary. It may not cover all possible information. If you have questions about this medicine, talk to your doctor, pharmacist, or health care provider.  2023 Elsevier/Gold Standard (2021-12-20 00:00:00)

## 2022-11-21 NOTE — Assessment & Plan Note (Signed)
Chronic problem  Intermittent painful episodes  Known hx of arthritis  Will have patient go to imaging center for updated xrays of the lumbar region  Offered short course of NSAIDS, patient would prefer to continue tylenol  She also reports some neuropathic pain on the right  Will do trial of gabapentin 100mg  at bed time for increased symptoms of nerve pain  F/u PRN

## 2022-11-21 NOTE — Assessment & Plan Note (Signed)
Chronic problem  Last TSH was low  Will repeat TSH and T4 today  Continue levothyroxine No medication changes today

## 2022-11-22 ENCOUNTER — Other Ambulatory Visit: Payer: Self-pay | Admitting: Family Medicine

## 2022-11-22 DIAGNOSIS — E039 Hypothyroidism, unspecified: Secondary | ICD-10-CM

## 2022-11-22 LAB — TSH+FREE T4
Free T4: 1.81 ng/dL — ABNORMAL HIGH (ref 0.82–1.77)
TSH: 0.194 u[IU]/mL — ABNORMAL LOW (ref 0.450–4.500)

## 2022-11-22 MED ORDER — LEVOTHYROXINE SODIUM 50 MCG PO TABS
50.0000 ug | ORAL_TABLET | Freq: Every day | ORAL | 3 refills | Status: DC
Start: 2022-11-22 — End: 2023-09-18

## 2022-12-19 ENCOUNTER — Other Ambulatory Visit: Payer: Self-pay | Admitting: Family Medicine

## 2022-12-19 DIAGNOSIS — M810 Age-related osteoporosis without current pathological fracture: Secondary | ICD-10-CM

## 2023-01-01 ENCOUNTER — Ambulatory Visit
Admission: RE | Admit: 2023-01-01 | Discharge: 2023-01-01 | Disposition: A | Discharge status: Home / Self Care | Payer: Medicare HMO | Source: Ambulatory Visit | Attending: Family Medicine | Admitting: Family Medicine

## 2023-01-01 DIAGNOSIS — Z13820 Encounter for screening for osteoporosis: Secondary | ICD-10-CM | POA: Diagnosis not present

## 2023-01-01 DIAGNOSIS — Z780 Asymptomatic menopausal state: Secondary | ICD-10-CM | POA: Insufficient documentation

## 2023-01-01 DIAGNOSIS — M810 Age-related osteoporosis without current pathological fracture: Secondary | ICD-10-CM | POA: Diagnosis not present

## 2023-01-01 DIAGNOSIS — E039 Hypothyroidism, unspecified: Secondary | ICD-10-CM | POA: Diagnosis not present

## 2023-01-01 DIAGNOSIS — M1990 Unspecified osteoarthritis, unspecified site: Secondary | ICD-10-CM | POA: Insufficient documentation

## 2023-01-01 DIAGNOSIS — Z0000 Encounter for general adult medical examination without abnormal findings: Secondary | ICD-10-CM | POA: Insufficient documentation

## 2023-01-08 ENCOUNTER — Other Ambulatory Visit: Payer: Self-pay | Admitting: Family Medicine

## 2023-03-18 ENCOUNTER — Other Ambulatory Visit: Payer: Self-pay | Admitting: Family Medicine

## 2023-03-18 DIAGNOSIS — Z1231 Encounter for screening mammogram for malignant neoplasm of breast: Secondary | ICD-10-CM

## 2023-04-10 ENCOUNTER — Other Ambulatory Visit: Payer: Self-pay | Admitting: Family Medicine

## 2023-04-10 DIAGNOSIS — I10 Essential (primary) hypertension: Secondary | ICD-10-CM

## 2023-04-11 NOTE — Telephone Encounter (Signed)
Requested Prescriptions  Pending Prescriptions Disp Refills   losartan (COZAAR) 100 MG tablet [Pharmacy Med Name: Losartan Potassium Oral Tablet 100 MG] 90 tablet 0    Sig: TAKE 1 TABLET EVERY DAY     Cardiovascular:  Angiotensin Receptor Blockers Failed - 04/10/2023  1:23 PM      Failed - Cr in normal range and within 180 days    Creatinine, Ser  Date Value Ref Range Status  07/02/2022 0.97 0.57 - 1.00 mg/dL Final         Failed - K in normal range and within 180 days    Potassium  Date Value Ref Range Status  07/02/2022 3.7 3.5 - 5.2 mmol/L Final  08/17/2014 3.2 (L) 3.5 - 5.1 mmol/L Final         Passed - Patient is not pregnant      Passed - Last BP in normal range    BP Readings from Last 1 Encounters:  11/21/22 133/75         Passed - Valid encounter within last 6 months    Recent Outpatient Visits           4 months ago Adult hypothyroidism   [place] [facility] Simmons[place]Robinson, [place], MD   7 months ago Essential (primary) hypertension   [place] [facility] [place], [place], MD   9 months ago Medicare annual wellness visit, subsequent   [place] [facility] Simmons[place]Robinson, [place], MD   1 year ago Essential (primary) hypertension   [place] [facility] Bosie Clos, MD   1 year ago Encounter for Medicare annual wellness exam   [facility] Bosie Clos, MD

## 2023-04-17 ENCOUNTER — Ambulatory Visit
Admission: RE | Admit: 2023-04-17 | Discharge: 2023-04-17 | Disposition: A | Discharge status: Home / Self Care | Payer: Medicare HMO | Source: Ambulatory Visit | Attending: Family Medicine | Admitting: Family Medicine

## 2023-04-17 DIAGNOSIS — Z1231 Encounter for screening mammogram for malignant neoplasm of breast: Secondary | ICD-10-CM | POA: Diagnosis not present

## 2023-05-15 ENCOUNTER — Ambulatory Visit (INDEPENDENT_AMBULATORY_CARE_PROVIDER_SITE_OTHER): Payer: Medicare HMO | Admitting: Physician Assistant

## 2023-05-15 ENCOUNTER — Encounter: Payer: Self-pay | Admitting: Physician Assistant

## 2023-05-15 VITALS — BP 142/86 | HR 86 | Ht 60.0 in | Wt 148.0 lb

## 2023-05-15 DIAGNOSIS — I10 Essential (primary) hypertension: Secondary | ICD-10-CM

## 2023-05-15 DIAGNOSIS — H6123 Impacted cerumen, bilateral: Secondary | ICD-10-CM

## 2023-05-15 DIAGNOSIS — Z23 Encounter for immunization: Secondary | ICD-10-CM

## 2023-05-15 DIAGNOSIS — E039 Hypothyroidism, unspecified: Secondary | ICD-10-CM

## 2023-05-15 DIAGNOSIS — E7800 Pure hypercholesterolemia, unspecified: Secondary | ICD-10-CM

## 2023-05-15 NOTE — Progress Notes (Signed)
Established patient visit  Patient: Lynn Williams   DOB: July 20, 1932   89 y.o. Female  MRN: [id number] Visit Date: 05/15/2023  Today's healthcare provider: Debera Lat, PA-C   Chief Complaint  Patient presents with   Follow-up   Subjective     HPI   Pt stated--still having hard hearing left ear. Last edited by Shelly Bombard, CMA on 05/15/2023  8:09 AM.     Discussed the use of AI scribe software for clinical note transcription with the patient, who gave verbal consent to proceed.  History of Present Illness   The patient presents with a blocked right ear, a recurring issue that requires annual cleaning. They believe the blockage may be related to a previous sinus problem. They also request a flu shot. The patient reports nasal congestion and postnasal drainage. They have a history of hypertension and are currently taking Losartan. They report their blood pressure is slightly elevated today despite taking their medication. They have a follow-up appointment with their primary care provider, Dr. Roxan Hockey, in December.           05/15/2023    8:13 AM 11/21/2022    9:09 AM 08/15/2022    8:47 AM  Depression screen PHQ 2/9  Decreased Interest 0 0 0  Down, Depressed, Hopeless 0 0 0  PHQ - 2 Score 0 0 0  Altered sleeping 0 0 0  Tired, decreased energy 0 2 1  Change in appetite 0 0 3  Feeling bad or failure about yourself  0 0 0  Trouble concentrating 0 0 0  Moving slowly or fidgety/restless 0 0 0  Suicidal thoughts 0 0 0  PHQ-9 Score 0 2 4  Difficult doing work/chores Not difficult at all Not difficult at all Not difficult at all      05/15/2023    8:13 AM  GAD 7 : Generalized Anxiety Score  Nervous, Anxious, on Edge 0  Control/stop worrying 0  Worry too much - different things 0  Trouble relaxing 0  Restless 0  Easily annoyed or irritable 0  Afraid - awful might happen 0  Total GAD 7 Score 0  Anxiety Difficulty Not difficult at all    Medications: Outpatient  Medications Prior to Visit  Medication Sig   alendronate (FOSAMAX) 70 MG tablet TAKE 1 TABLET ONE TIME WEEKLY. TAKE WITH A FULL GLASS OF WATER ON AN EMPTY STOMACH.   Calcium-Magnesium-Vitamin D (CALCIUM 500 PO) Take by mouth. am   cetirizine (ZYRTEC) 10 MG tablet Take by mouth as needed.    hydrochlorothiazide (HYDRODIURIL) 25 MG tablet TAKE 1 TABLET EVERY DAY   levothyroxine (SYNTHROID) 50 MCG tablet Take 1 tablet (50 mcg total) by mouth daily.   losartan (COZAAR) 100 MG tablet TAKE 1 TABLET EVERY DAY   Multiple Vitamins-Minerals (MULTIVITAMIN ADULT PO) Take by mouth. am   pantoprazole (PROTONIX) 40 MG tablet TAKE 1 TABLET EVERY DAY   simvastatin (ZOCOR) 20 MG tablet Take 1 tablet (20 mg total) by mouth at bedtime.   gabapentin (NEURONTIN) 100 MG capsule Take 1 capsule (100 mg total) by mouth at bedtime. (Patient not taking: Reported on 05/15/2023)   No facility-administered medications prior to visit.    Review of Systems  All other systems reviewed and are negative.  Except see HPI       Objective    BP (!) 142/86 (BP Location: Right Arm, Patient Position: Sitting, Cuff Size: Normal)   Pulse 86   Ht 5' (1.524 m)  Wt 148 lb (67.1 kg)   LMP  (LMP Unknown)   SpO2 99%   BMI 28.90 kg/m     Physical Exam Vitals reviewed.  Constitutional:      General: She is not in acute distress.    Appearance: Normal appearance. She is well-developed. She is not diaphoretic.  HENT:     Head: Normocephalic and atraumatic.     Right Ear: Ear canal and external ear normal. There is impacted cerumen.     Left Ear: Ear canal and external ear normal. There is impacted cerumen (partially).  Eyes:     General: No scleral icterus.    Conjunctiva/sclera: Conjunctivae normal.  Neck:     Thyroid: No thyromegaly.  Cardiovascular:     Rate and Rhythm: Normal rate and regular rhythm.     Pulses: Normal pulses.     Heart sounds: Normal heart sounds. No murmur heard. Pulmonary:     Effort:  Pulmonary effort is normal. No respiratory distress.     Breath sounds: Normal breath sounds. No wheezing, rhonchi or rales.  Musculoskeletal:     Cervical back: Neck supple.     Right lower leg: No edema.     Left lower leg: No edema.  Lymphadenopathy:     Cervical: No cervical adenopathy.  Skin:    General: Skin is warm and dry.     Findings: No rash.  Neurological:     Mental Status: She is alert and oriented to person, place, and time. Mental status is at baseline.  Psychiatric:        Mood and Affect: Mood normal.        Behavior: Behavior normal.      No results found for any visits on 05/15/23.  Assessment & Plan      hypertension Chronic Elevated blood pressure (142/86) despite taking Losartan. Patient has home blood pressure monitoring. -Continue Losartan as prescribed. Encouraged low salt diet -Advise patient to monitor blood pressure at home and record readings. -If readings remain consistently elevated, patient should return for an earlier appointment with Dr. Roxan Hockey.  Excessive cerumen in both ear canals Right ear impaction with a history of recurrent impactions. No signs of infection. Hearing impaired -Perform ear irrigation to remove impaction by CMA.  Encounter for immunization - Flu Vaccine Trivalent High Dose (Fluad)  General Health Maintenance -Administer influenza vaccine today.      No follow-ups on file.     The patient was advised to call back or seek an in-person evaluation if the symptoms worsen or if the condition fails to improve as anticipated.  I discussed the assessment and treatment plan with the patient. The patient was provided an opportunity to ask questions and all were answered. The patient agreed with the plan and demonstrated an understanding of the instructions.  I, Debera Lat, PA-C have reviewed all documentation for this visit. The documentation on 05/15/23 for the exam, diagnosis, procedures, and orders are all accurate  and complete.  Debera Lat, [facility], MMS [facility] [phone] (phone) [phone] (fax)' [facility]

## 2023-06-06 ENCOUNTER — Other Ambulatory Visit: Payer: Self-pay | Admitting: Physician Assistant

## 2023-06-06 DIAGNOSIS — I10 Essential (primary) hypertension: Secondary | ICD-10-CM

## 2023-06-07 NOTE — Telephone Encounter (Signed)
Requested medication (s) are due for refill today -expired Rx  Requested medication (s) are on the active medication list -yes  Future visit scheduled -yes  Last refill: 05/10/22 #90 10RF  Notes to clinic: former provider- needs review, fails lab protocol, upcoming appointment is scheduled   Requested Prescriptions  Pending Prescriptions Disp Refills   hydrochlorothiazide (HYDRODIURIL) 25 MG tablet [Pharmacy Med Name: hydroCHLOROthiazide Oral Tablet 25 MG] 90 tablet 3    Sig: TAKE 1 TABLET EVERY DAY     Cardiovascular: Diuretics - Thiazide Failed - 06/06/2023  3:24 PM      Failed - Cr in normal range and within 180 days    Creatinine, Ser  Date Value Ref Range Status  07/02/2022 0.97 0.57 - 1.00 mg/dL Final         Failed - K in normal range and within 180 days    Potassium  Date Value Ref Range Status  07/02/2022 3.7 3.5 - 5.2 mmol/L Final  08/17/2014 3.2 (L) 3.5 - 5.1 mmol/L Final         Failed - Na in normal range and within 180 days    Sodium  Date Value Ref Range Status  07/02/2022 136 134 - 144 mmol/L Final         Failed - Last BP in normal range    BP Readings from Last 1 Encounters:  05/15/23 (!) 142/86         Passed - Valid encounter within last 6 months    Recent Outpatient Visits           3 weeks ago Excessive cerumen in both ear canals   [facility] [place], [place], PA-C   6 months ago Adult hypothyroidism   [facility] Simmons-Robinson, [place], MD   9 months ago Essential (primary) hypertension   [place] [facility] Simmons-Robinson, [place], MD   11 months ago Medicare annual wellness visit, subsequent   [place] [facility] [place], [place], MD   1 year ago Essential (primary) hypertension   [place] [facility] Bosie Clos, MD                 Requested Prescriptions  Pending Prescriptions Disp Refills    hydrochlorothiazide (HYDRODIURIL) 25 MG tablet [Pharmacy Med Name: hydroCHLOROthiazide Oral Tablet 25 MG] 90 tablet 3    Sig: TAKE 1 TABLET EVERY DAY     Cardiovascular: Diuretics - Thiazide Failed - 06/06/2023  3:24 PM      Failed - Cr in normal range and within 180 days    Creatinine, Ser  Date Value Ref Range Status  07/02/2022 0.97 0.57 - 1.00 mg/dL Final         Failed - K in normal range and within 180 days    Potassium  Date Value Ref Range Status  07/02/2022 3.7 3.5 - 5.2 mmol/L Final  08/17/2014 3.2 (L) 3.5 - 5.1 mmol/L Final         Failed - Na in normal range and within 180 days    Sodium  Date Value Ref Range Status  07/02/2022 136 134 - 144 mmol/L Final         Failed - Last BP in normal range    BP Readings from Last 1 Encounters:  05/15/23 (!) 142/86         Passed - Valid encounter within last 6 months    Recent Outpatient Visits  3 weeks ago Excessive cerumen in both ear canals   [place] [facility] [place], [place], PA-C   6 months ago Adult hypothyroidism   [place] [facility] Simmons-Robinson, [place], MD   9 months ago Essential (primary) hypertension   [place] [facility] Simmons-Robinson, [place], MD   11 months ago Medicare annual wellness visit, subsequent   [place] [facility] Simmons-Robinson, [place], MD   1 year ago Essential (primary) hypertension   [place] [facility] Bosie Clos, MD

## 2023-06-26 ENCOUNTER — Other Ambulatory Visit: Payer: Self-pay | Admitting: Family Medicine

## 2023-06-26 DIAGNOSIS — I10 Essential (primary) hypertension: Secondary | ICD-10-CM

## 2023-06-28 NOTE — Telephone Encounter (Signed)
Requested Prescriptions  Pending Prescriptions Disp Refills   simvastatin (ZOCOR) 20 MG tablet [Pharmacy Med Name: Simvastatin Oral Tablet 20 MG] 90 tablet 3    Sig: TAKE 1 TABLET AT BEDTIME     Cardiovascular:  Antilipid - Statins Failed - 06/26/2023 10:14 AM      Failed - Lipid Panel in normal range within the last 12 months    Cholesterol, Total  Date Value Ref Range Status  07/02/2022 138 100 - 199 mg/dL Final   LDL Chol Calc (NIH)  Date Value Ref Range Status  07/02/2022 62 0 - 99 mg/dL Final   HDL  Date Value Ref Range Status  07/02/2022 63 >39 mg/dL Final   Triglycerides  Date Value Ref Range Status  07/02/2022 64 0 - 149 mg/dL Final         Passed - Patient is not pregnant      Passed - Valid encounter within last 12 months    Recent Outpatient Visits           1 month ago Excessive cerumen in both ear canals   [place] [facility] [place], [place], PA-C   7 months ago Adult hypothyroidism   [place] [facility] Simmons[place]Robinson, [place], MD   10 months ago Essential (primary) hypertension   [place] [facility] Simmons-Robinson, [place], MD   12 months ago Medicare annual wellness visit, subsequent   [place] [facility] [place], [place], MD   1 year ago Essential (primary) hypertension   [place] [facility] Bosie Clos, MD               losartan (COZAAR) 100 MG tablet [Pharmacy Med Name: Losartan Potassium Oral Tablet 100 MG] 90 tablet 0    Sig: TAKE 1 TABLET EVERY DAY     Cardiovascular:  Angiotensin Receptor Blockers Failed - 06/26/2023 10:14 AM      Failed - Cr in normal range and within 180 days    Creatinine, Ser  Date Value Ref Range Status  07/02/2022 0.97 0.57 - 1.00 mg/dL Final         Failed - K in normal range and within 180 days    Potassium  Date Value Ref Range Status  07/02/2022 3.7 3.5 - 5.2 mmol/L Final  08/17/2014 3.2 (L)  3.5 - 5.1 mmol/L Final         Failed - Last BP in normal range    BP Readings from Last 1 Encounters:  05/15/23 (!) 142/86         Passed - Patient is not pregnant      Passed - Valid encounter within last 6 months    Recent Outpatient Visits           1 month ago Excessive cerumen in both ear canals   [place] [facility] [place], [place], PA-C   7 months ago Adult hypothyroidism   [place] [facility] Simmons-Robinson, [place], MD   10 months ago Essential (primary) hypertension   [place] [facility] Simmons-Robinson, [place], MD   12 months ago Medicare annual wellness visit, subsequent   [place] [facility] Simmons-Robinson, [place], MD   1 year ago Essential (primary) hypertension   [place] [facility] Bosie Clos, MD

## 2023-07-10 ENCOUNTER — Ambulatory Visit: Payer: Medicare HMO | Admitting: Family Medicine

## 2023-07-10 ENCOUNTER — Ambulatory Visit
Admission: RE | Admit: 2023-07-10 | Discharge: 2023-07-10 | Disposition: A | Discharge status: Home / Self Care | Payer: Medicare HMO | Source: Ambulatory Visit | Attending: Family Medicine | Admitting: Family Medicine

## 2023-07-10 ENCOUNTER — Encounter: Payer: Self-pay | Admitting: Family Medicine

## 2023-07-10 VITALS — BP 169/71 | HR 64 | Resp 16 | Ht 60.0 in | Wt 145.0 lb

## 2023-07-10 DIAGNOSIS — E7800 Pure hypercholesterolemia, unspecified: Secondary | ICD-10-CM

## 2023-07-10 DIAGNOSIS — I10 Essential (primary) hypertension: Secondary | ICD-10-CM

## 2023-07-10 DIAGNOSIS — M545 Low back pain, unspecified: Secondary | ICD-10-CM | POA: Diagnosis not present

## 2023-07-10 DIAGNOSIS — M25511 Pain in right shoulder: Secondary | ICD-10-CM

## 2023-07-10 DIAGNOSIS — R42 Dizziness and giddiness: Secondary | ICD-10-CM

## 2023-07-10 DIAGNOSIS — E039 Hypothyroidism, unspecified: Secondary | ICD-10-CM | POA: Diagnosis not present

## 2023-07-10 DIAGNOSIS — Z0000 Encounter for general adult medical examination without abnormal findings: Secondary | ICD-10-CM

## 2023-07-10 DIAGNOSIS — M19011 Primary osteoarthritis, right shoulder: Secondary | ICD-10-CM | POA: Diagnosis not present

## 2023-07-10 DIAGNOSIS — M542 Cervicalgia: Secondary | ICD-10-CM

## 2023-07-10 DIAGNOSIS — G8929 Other chronic pain: Secondary | ICD-10-CM | POA: Insufficient documentation

## 2023-07-10 DIAGNOSIS — M62830 Muscle spasm of back: Secondary | ICD-10-CM

## 2023-07-10 DIAGNOSIS — M62838 Other muscle spasm: Secondary | ICD-10-CM | POA: Insufficient documentation

## 2023-07-10 MED ORDER — TIZANIDINE HCL 4 MG PO TABS
4.0000 mg | ORAL_TABLET | Freq: Four times a day (QID) | ORAL | 0 refills | Status: DC | PRN
Start: 2023-07-10 — End: 2023-08-01

## 2023-07-10 NOTE — Assessment & Plan Note (Addendum)
Chronic  BP today is not at goal  Elevated blood pressure at 169/71. Reports  intermittent dizziness with standing.  Unreliable home blood pressure monitor. Discussed accurate blood pressure monitoring and risks of untreated hypertension, including stroke and heart disease. Lab tests to rule out anemia or thyroid dysfunction. - patient states she did not take BP medication prior to today's visit which may contribute to elevated pressures  - Order complete metabolic panel - Order CBC - Check thyroid levels - Schedule follow-up in two weeks - Recheck blood pressure

## 2023-07-10 NOTE — Assessment & Plan Note (Signed)
Annual wellness visit completed today including all of the following: -Reviewed patient's family medical history -Reviewed and updated patient's list of medical providers -Completed assessment of cognitive impairment -Completed assessment of patient's functional ability -Provided patient with recommendations for health screening services as well as vaccines Health risk assessment completed and reviewed -Discussed recommendations for well-balanced diet in addition to 150 minutes of physical activity per week  

## 2023-07-10 NOTE — Assessment & Plan Note (Signed)
Chronic right-sided neck and shoulder pain with limited range of motion and muscle spasms in the trapezius muscle, likely related to a fall in June. No radiating pain to hands or arms. Discussed tizanidine for muscle relaxation, heat therapy, and continued exercises. X-rays to rule out structural issues. - Order cervical spine x-ray - Order right shoulder x-ray - Prescribe tizanidine 4 mg every six hours as needed - Recommend use of heating pad for 15-20 minutes a few times a day - Encourage continued range of motion exercises and massage

## 2023-07-10 NOTE — Patient Instructions (Addendum)
  Please visit [facility] for your imaging ordered during today's visit   You do not need an appointment. We will follow up with the results once available.   [address], [place], [place] [address]   We will follow up with results of labs once they are available.     VISIT SUMMARY:  Today, you had your annual wellness visit. We discussed your hypertension, neck and shoulder pain, back pain, and general health maintenance. You reported a fall in June that has led to ongoing pain, and you have also experienced episodes of sudden weakness. We reviewed your current health status, medications, and lifestyle habits.  YOUR PLAN:  -HYPERTENSION: Hypertension, or high blood pressure, can increase the risk of heart disease and stroke. Your blood pressure was elevated today, and you reported episodes of weakness. We will conduct lab tests to rule out other conditions and recheck your blood pressure. Please ensure accurate monitoring at home and follow up in two weeks.  -NECK AND SHOULDER PAIN: Your neck and shoulder pain, likely related to a fall in June, is causing limited movement and muscle spasms. We will perform x-rays to check for structural issues and have prescribed tizanidine for muscle relaxation. Continue using a heating pad and doing range of motion exercises.  -BACK PAIN: Your chronic back pain may be due to degenerative changes in your spine. We will perform a lumbar spine x-ray to assess any progression. Continue with your current pain management strategies.  -GENERAL HEALTH MAINTENANCE: We reviewed your diet, exercise routine, and overall well-being. You received the Shingrix and updated COVID vaccines today. Routine labs were ordered as part of your annual physical. Keep up with your regular exercise and healthy diet.  INSTRUCTIONS:  Please schedule a follow-up appointment in two weeks. Ensure you monitor your blood pressure accurately at home and bring the  readings to your next visit. We will review the results of your lab tests and x-rays at that time.

## 2023-07-10 NOTE — Progress Notes (Signed)
Complete physical exam   Patient: Lynn Williams   DOB: Jul 18, 1932   89 y.o. Female  MRN: [id number] Visit Date: 07/10/2023  Today's healthcare provider: Ronnald Ramp, MD   Chief Complaint  Patient presents with   Annual Exam   Medicare Wellness   Subjective    Lynn Williams is a 89 y.o. female who presents today for a complete physical exam.   She reports consuming a general diet.   Gym/ health club routine includes group exercise four times weekly .   She generally feels fairly well.   She reports sleeping well.    She does have additional problems to discuss today.   Discussed the use of AI scribe software for clinical note transcription with the patient, who gave verbal consent to proceed.  History of Present Illness   The patient, an 89 year old with a history of hypertension, presented for an annual wellness visit. She reported a regular diet and a routine of attending an exercise class for older adults four days a week. The patient has been experiencing intermittent sleep disturbances.  The patient reported a fall during sleep in early June, which resulted in back and hip pain. A few weeks later, she began experiencing neck and shoulder pain. The pain originates in the middle of the neck, radiates into the right shoulder, and limits the patient's ability to lift her arm. The patient denied any shooting pain down the arm or into the hands. She has been managing the pain with over-the-counter medications and a heating pad, which provides temporary relief.  In the past week, the patient has experienced episodes of sudden weakness, feeling as though she might pass out. She has been monitoring her blood pressure at home, but the readings have been inconsistent. The patient has not yet taken her blood pressure medication on the day of the visit.         Past Medical History:  Diagnosis Date   Arthritis    hands and wrist and knees   Cough     chronic/allergies   GERD (gastroesophageal reflux disease)    Hyperlipidemia    Hypertension    controlled on meds   Hypothyroidism    Wears partial dentures    upper   Past Surgical History:  Procedure Laterality Date   BREAST CYST ASPIRATION     BREAST EXCISIONAL BIOPSY Right 1978   benign   CATARACT EXTRACTION Bilateral    ESOPHAGOGASTRODUODENOSCOPY (EGD) WITH PROPOFOL N/A 01/16/2016   Procedure: ESOPHAGOGASTRODUODENOSCOPY (EGD) WITH PROPOFOL;  Surgeon: Midge Minium, MD;  Location: [facility];  Service: Endoscopy;  Laterality: N/A;   TUBAL LIGATION     Social History   Socioeconomic History   Marital status: Married    Spouse name: Not on file   Number of children: 0   Years of education: Not on file   Highest education level: GED or equivalent  Occupational History   Occupation: retired  Tobacco Use   Smoking status: Never   Smokeless tobacco: Never  Vaping Use   Vaping status: Never Used  Substance and Sexual Activity   Alcohol use: No    Alcohol/week: 0.0 standard drinks of alcohol   Drug use: No   Sexual activity: Not on file  Other Topics Concern   Not on file  Social History Narrative   Not on file   Social Drivers of Health   Financial Resource Strain: Low Risk  (07/10/2023)   Overall Financial Resource Strain (CARDIA)  Difficulty of Paying Living Expenses: Not hard at all  Food Insecurity: No Food Insecurity (07/10/2023)   Hunger Vital Sign    Worried About Running Out of Food in the Last Year: Never true    Ran Out of Food in the Last Year: Never true  Transportation Needs: No Transportation Needs (07/10/2023)   PRAPARE - Administrator, Civil Service (Medical): No    Lack of Transportation (Non-Medical): No  Physical Activity: Sufficiently Active (07/10/2023)   Exercise Vital Sign    Days of Exercise per Week: 4 days    Minutes of Exercise per Session: 60 min  Stress: No Stress Concern Present (07/10/2023)   [employer] of Occupational Health - Occupational Stress Questionnaire    Feeling of Stress : Only a little  Social Connections: Moderately Integrated (07/10/2023)   Social Connection and Isolation Panel [NHANES]    Frequency of Communication with Friends and Family: More than three times a week    Frequency of Social Gatherings with Friends and Family: More than three times a week    Attends Religious Services: More than 4 times per year    Active Member of [employer] or Organizations: No    Attends Banker Meetings: Never    Marital Status: Married  Catering manager Violence: Not At Risk (07/10/2023)   Humiliation, Afraid, Rape, and Kick questionnaire    Fear of Current or Ex-Partner: No    Emotionally Abused: No    Physically Abused: No    Sexually Abused: No   Family Status  Relation Name Status   Mother  Deceased   Father  Deceased   Brother  Deceased   Sister  Deceased   Brother  Deceased   Sister  Alive   Sister  (Not Specified)   Sister  (Not Specified)   Brother  (Not Specified)   Brother  (Not Specified)   Brother  (Not Specified)   Neg Hx  (Not Specified)  No partnership data on file   Family History  Problem Relation Age of Onset   Hypertension Mother    Heart attack Mother    Stroke Mother    Prostate cancer Father    Heart attack Brother    Stroke Sister    Heart attack Sister    Coronary artery disease Sister    Alzheimer's disease Sister    Heart attack Brother    COPD Brother    Heart attack Sister    Coronary artery disease Sister    Diabetes Sister    Hypertension Sister    Heart disease Sister    Heart disease Brother    Heart disease Brother    COPD Brother    Breast cancer Neg Hx    Allergies  Allergen Reactions   Sulfa Antibiotics Itching     Medications: Outpatient Medications Prior to Visit  Medication Sig   alendronate (FOSAMAX) 70 MG tablet TAKE 1 TABLET ONE TIME WEEKLY. TAKE WITH A FULL GLASS OF WATER ON AN EMPTY  STOMACH.   Calcium-Magnesium-Vitamin D (CALCIUM 500 PO) Take by mouth. am   cetirizine (ZYRTEC) 10 MG tablet Take by mouth as needed.    hydrochlorothiazide (HYDRODIURIL) 25 MG tablet TAKE 1 TABLET EVERY DAY   levothyroxine (SYNTHROID) 50 MCG tablet Take 1 tablet (50 mcg total) by mouth daily.   losartan (COZAAR) 100 MG tablet TAKE 1 TABLET EVERY DAY   Multiple Vitamins-Minerals (MULTIVITAMIN ADULT PO) Take by mouth. am   pantoprazole (PROTONIX) 40  MG tablet TAKE 1 TABLET EVERY DAY   simvastatin (ZOCOR) 20 MG tablet TAKE 1 TABLET AT BEDTIME   [DISCONTINUED] gabapentin (NEURONTIN) 100 MG capsule Take 1 capsule (100 mg total) by mouth at bedtime. (Patient not taking: Reported on 05/15/2023)   No facility-administered medications prior to visit.    Review of Systems  Last CBC Lab Results  Component Value Date   WBC 5.5 07/02/2022   HGB 11.7 07/02/2022   HCT 34.1 07/02/2022   MCV 86 07/02/2022   MCH 29.5 07/02/2022   RDW 11.9 07/02/2022   PLT 307 07/02/2022   Last metabolic panel Lab Results  Component Value Date   GLUCOSE 90 07/02/2022   NA 136 07/02/2022   K 3.7 07/02/2022   CL 96 07/02/2022   CO2 29 07/02/2022   BUN 12 07/02/2022   CREATININE 0.97 07/02/2022   EGFR 56 (L) 07/02/2022   CALCIUM 10.1 07/02/2022   PROT 6.7 07/02/2022   ALBUMIN 4.3 07/02/2022   LABGLOB 2.4 07/02/2022   AGRATIO 1.8 07/02/2022   BILITOT 0.4 07/02/2022   ALKPHOS 67 07/02/2022   AST 24 07/02/2022   ALT 13 07/02/2022   Last lipids Lab Results  Component Value Date   CHOL 138 07/02/2022   HDL 63 07/02/2022   LDLCALC 62 07/02/2022   TRIG 64 07/02/2022   CHOLHDL 2.2 07/02/2022   Last hemoglobin A1c No results found for: "HGBA1C" Last thyroid functions Lab Results  Component Value Date   TSH 0.194 (L) 11/21/2022       Objective    BP (!) 169/71   Pulse 64   Resp 16   Ht 5' (1.524 m)   Wt 145 lb (65.8 kg)   LMP  (LMP Unknown)   SpO2 100%   BMI 28.32 kg/m  BP Readings from  Last 3 Encounters:  07/10/23 (!) 169/71  05/15/23 (!) 142/86  11/21/22 133/75   Wt Readings from Last 3 Encounters:  07/10/23 145 lb (65.8 kg)  05/15/23 148 lb (67.1 kg)  11/21/22 158 lb (71.7 kg)        Physical Exam Vitals reviewed.  Constitutional:      General: She is not in acute distress.    Appearance: Normal appearance. She is not ill-appearing, toxic-appearing or diaphoretic.  Eyes:     Conjunctiva/sclera: Conjunctivae normal.  Cardiovascular:     Rate and Rhythm: Normal rate and regular rhythm.     Pulses: Normal pulses.     Heart sounds: Normal heart sounds. No murmur heard.    No friction rub. No gallop.  Pulmonary:     Effort: Pulmonary effort is normal. No respiratory distress.     Breath sounds: Normal breath sounds. No stridor. No wheezing, rhonchi or rales.  Abdominal:     General: Bowel sounds are normal. There is no distension.     Palpations: Abdomen is soft.     Tenderness: There is no abdominal tenderness.  Musculoskeletal:     Right lower leg: No edema.     Left lower leg: No edema.  Skin:    Findings: No erythema or rash.  Neurological:     Mental Status: She is alert and oriented to person, place, and time.     Physical Exam   VITALS: BP- 169/71 MUSCULOSKELETAL: Right trapezius muscle spasm. Decreased range of motion with discomfort when turning head to the left. Good upper extremity strength with pain upon exertion. Limited range of motion in right arm lateral lift and forward flexion. Limited  right arm abduction to shoulder level. Pain with attempted elevation of right arm above head, indicating limited range of motion. Able to touch left shoulder with right hand without significant discomfort.       Last depression screening scores    07/10/2023    9:20 AM 05/15/2023    8:13 AM 11/21/2022    9:09 AM  PHQ 2/9 Scores  PHQ - 2 Score 0 0 0  PHQ- 9 Score 0 0 2    Last fall risk screening    07/10/2023    9:21 AM  Fall Risk   Falls  in the past year? 1  Number falls in past yr: 0  Injury with Fall? 1    Last Audit-C alcohol use screening    11/21/2022    9:11 AM  Alcohol Use Disorder Test (AUDIT)  1. How often do you have a drink containing alcohol? 0  2. How many drinks containing alcohol do you have on a typical day when you are drinking? 0  3. How often do you have six or more drinks on one occasion? 0  AUDIT-C Score 0   A score of 3 or more in women, and 4 or more in men indicates increased risk for alcohol abuse, EXCEPT if all of the points are from question 1   No results found for any visits on 07/10/23.  Assessment & Plan    Routine Health Maintenance and Physical Exam  Immunization History  Administered Date(s) Administered   Fluad Quad(high Dose 65+) 05/07/2019, 06/15/2020, 05/02/2021, 07/02/2022   Fluad Trivalent(High Dose 65+) 05/15/2023   Influenza, High Dose Seasonal PF 05/04/2015, 05/14/2016, 05/08/2017, 04/03/2018   PFIZER(Purple Top)SARS-COV-2 Vaccination 08/18/2019, 09/08/2019, 06/02/2020   Pneumococcal Conjugate-13 12/23/2013   Pneumococcal Polysaccharide-23 07/24/1996, 08/04/2003    Health Maintenance  Topic Date Due   Zoster Vaccines- Shingrix (1 of 2) Never done   COVID-19 Vaccine (4 - 2024-25 season) 03/24/2023   Medicare Annual Wellness (AWV)  07/09/2024   DEXA SCAN  12/31/2024   Pneumonia Vaccine 67+ Years old  Completed   INFLUENZA VACCINE  Completed   HPV VACCINES  Aged Out   DTaP/Tdap/Td  Discontinued    Problem List Items Addressed This Visit       Cardiovascular and Mediastinum   Essential (primary) hypertension   Chronic  BP today is not at goal  Elevated blood pressure at 169/71. Reports  intermittent dizziness with standing.  Unreliable home blood pressure monitor. Discussed accurate blood pressure monitoring and risks of untreated hypertension, including stroke and heart disease. Lab tests to rule out anemia or thyroid dysfunction. - patient states she did not  take BP medication prior to today's visit which may contribute to elevated pressures  - Order complete metabolic panel - Order CBC - Check thyroid levels - Schedule follow-up in two weeks - Recheck blood pressure      Relevant Orders   Comprehensive metabolic panel     Endocrine   Adult hypothyroidism   Relevant Orders   TSH+T4F+T3Free     Musculoskeletal and Integument   Trapezius muscle spasm   Relevant Medications   tiZANidine (ZANAFLEX) 4 MG tablet     Other   Neck pain   Relevant Medications   tiZANidine (ZANAFLEX) 4 MG tablet   Other Relevant Orders   DG Cervical Spine Complete   Hypercholesteremia   Encounter for annual wellness visit (AWV) in Medicare patient - Primary   Annual wellness visit completed today including all of the following: -  Reviewed patient's family medical history -Reviewed and updated patient's list of medical providers -Completed assessment of cognitive impairment -Completed assessment of patient's functional ability -Provided patient with recommendations for health screening services as well as vaccines Health risk assessment completed and reviewed -Discussed recommendations for well-balanced diet in addition to 150 minutes of physical activity per week       Acute pain of right shoulder   Chronic right-sided neck and shoulder pain with limited range of motion and muscle spasms in the trapezius muscle, likely related to a fall in June. No radiating pain to hands or arms. Discussed tizanidine for muscle relaxation, heat therapy, and continued exercises. X-rays to rule out structural issues. - Order cervical spine x-ray - Order right shoulder x-ray - Prescribe tizanidine 4 mg every six hours as needed - Recommend use of heating pad for 15-20 minutes a few times a day - Encourage continued range of motion exercises and massage      Relevant Orders   DG Shoulder Right   Other Visit Diagnoses       Annual physical exam         Chronic  left-sided low back pain without sciatica       Relevant Medications   tiZANidine (ZANAFLEX) 4 MG tablet   Other Relevant Orders   DG Lumbar Spine Complete     Dizziness       Relevant Orders   CBC   Comprehensive metabolic panel          Back Pain Chronic left-sided back pain, possibly related to degenerative changes noted in previous lumbar spine x-ray. Discussed imaging to assess progression of degenerative changes. - Order lumbar spine x-ray  General Health Maintenance Annual physical and Medicare wellness visit. Discussed diet, exercise, and general well-being. Exercises four days a week and follows a regular diet. Recommended Shingrix and updated COVID vaccines. - recommended Shingrix vaccine - recommended  updated COVID vaccine - Order labs for physical     Return in 2 weeks (on 07/24/2023) for HTN, neck and shoulder pain .       Ronnald Ramp, MD  [facility] [phone] (phone) [phone] (fax)  [facility]  Subjective:   Lynn Williams is a 89 y.o. female who presents for Medicare Annual (Subsequent) preventive examination.  Visit Complete: In person     Objective:    Today's Vitals   07/10/23 0910  BP: (!) 169/71  Pulse: 64  Resp: 16  SpO2: 100%  Weight: 145 lb (65.8 kg)  Height: 5' (1.524 m)  PainSc: 5    Body mass index is 28.32 kg/m.     12/14/2019    1:23 PM 12/10/2018    9:01 AM 12/03/2017    9:10 AM 08/28/2016    8:54 AM 01/16/2016   10:13 AM 12/22/2015    1:32 PM 11/28/2015    8:47 AM  Advanced Directives  Does Patient Have a Medical Advance Directive? Yes Yes Yes Yes Yes Yes Yes  Type of Estate agent of [place];Living will Healthcare Power of [place];Living will Healthcare Power of [place];Living will Living will;Healthcare Power of [employer] Power of [place];Living will Healthcare Power of [place];Living will   Does patient want to make  changes to medical advance directive?     No - Patient declined    Copy of Healthcare Power of Attorney in Chart? Yes - validated most recent copy scanned in chart (See row information) Yes - validated most recent copy  scanned in chart (See row information) No - copy requested No - copy requested No - copy requested      Current Medications (verified) Outpatient Encounter Medications as of 07/10/2023  Medication Sig   alendronate (FOSAMAX) 70 MG tablet TAKE 1 TABLET ONE TIME WEEKLY. TAKE WITH A FULL GLASS OF WATER ON AN EMPTY STOMACH.   Calcium-Magnesium-Vitamin D (CALCIUM 500 PO) Take by mouth. am   cetirizine (ZYRTEC) 10 MG tablet Take by mouth as needed.    hydrochlorothiazide (HYDRODIURIL) 25 MG tablet TAKE 1 TABLET EVERY DAY   levothyroxine (SYNTHROID) 50 MCG tablet Take 1 tablet (50 mcg total) by mouth daily.   losartan (COZAAR) 100 MG tablet TAKE 1 TABLET EVERY DAY   Multiple Vitamins-Minerals (MULTIVITAMIN ADULT PO) Take by mouth. am   pantoprazole (PROTONIX) 40 MG tablet TAKE 1 TABLET EVERY DAY   simvastatin (ZOCOR) 20 MG tablet TAKE 1 TABLET AT BEDTIME   tiZANidine (ZANAFLEX) 4 MG tablet Take 1 tablet (4 mg total) by mouth every 6 (six) hours as needed for muscle spasms.   [DISCONTINUED] gabapentin (NEURONTIN) 100 MG capsule Take 1 capsule (100 mg total) by mouth at bedtime. (Patient not taking: Reported on 05/15/2023)   No facility-administered encounter medications on file as of 07/10/2023.    Allergies (verified) Sulfa antibiotics   History: Past Medical History:  Diagnosis Date   Arthritis    hands and wrist and knees   Cough    chronic/allergies   GERD (gastroesophageal reflux disease)    Hyperlipidemia    Hypertension    controlled on meds   Hypothyroidism    Wears partial dentures    upper   Past Surgical History:  Procedure Laterality Date   BREAST CYST ASPIRATION     BREAST EXCISIONAL BIOPSY Right 1978   benign   CATARACT EXTRACTION Bilateral     ESOPHAGOGASTRODUODENOSCOPY (EGD) WITH PROPOFOL N/A 01/16/2016   Procedure: ESOPHAGOGASTRODUODENOSCOPY (EGD) WITH PROPOFOL;  Surgeon: Midge Minium, MD;  Location: [facility];  Service: Endoscopy;  Laterality: N/A;   TUBAL LIGATION     Family History  Problem Relation Age of Onset   Hypertension Mother    Heart attack Mother    Stroke Mother    Prostate cancer Father    Heart attack Brother    Stroke Sister    Heart attack Sister    Coronary artery disease Sister    Alzheimer's disease Sister    Heart attack Brother    COPD Brother    Heart attack Sister    Coronary artery disease Sister    Diabetes Sister    Hypertension Sister    Heart disease Sister    Heart disease Brother    Heart disease Brother    COPD Brother    Breast cancer Neg Hx    Social History   Socioeconomic History   Marital status: Married    Spouse name: Not on file   Number of children: 0   Years of education: Not on file   Highest education level: GED or equivalent  Occupational History   Occupation: retired  Tobacco Use   Smoking status: Never   Smokeless tobacco: Never  Vaping Use   Vaping status: Never Used  Substance and Sexual Activity   Alcohol use: No    Alcohol/week: 0.0 standard drinks of alcohol   Drug use: No   Sexual activity: Not on file  Other Topics Concern   Not on file  Social History Narrative   Not on  file   Social Drivers of Health   Financial Resource Strain: Low Risk  (07/10/2023)   Overall Financial Resource Strain (CARDIA)    Difficulty of Paying Living Expenses: Not hard at all  Food Insecurity: No Food Insecurity (07/10/2023)   Hunger Vital Sign    Worried About Running Out of Food in the Last Year: Never true    Ran Out of Food in the Last Year: Never true  Transportation Needs: No Transportation Needs (07/10/2023)   PRAPARE - Administrator, Civil Service (Medical): No    Lack of Transportation (Non-Medical): No  Physical Activity:  Sufficiently Active (07/10/2023)   Exercise Vital Sign    Days of Exercise per Week: 4 days    Minutes of Exercise per Session: 60 min  Stress: No Stress Concern Present (07/10/2023)   [employer] of Occupational Health - Occupational Stress Questionnaire    Feeling of Stress : Only a little  Social Connections: Moderately Integrated (07/10/2023)   Social Connection and Isolation Panel [NHANES]    Frequency of Communication with Friends and Family: More than three times a week    Frequency of Social Gatherings with Friends and Family: More than three times a week    Attends Religious Services: More than 4 times per year    Active Member of [employer] or Organizations: No    Attends Engineer, structural: Never    Marital Status: Married    Tobacco Counseling Counseling given: Not Answered   Clinical Intake:  Pre-visit preparation completed: Yes  Pain : 0-10 Pain Score: 5  Pain Location: Back (Neck and Right Shoulder) Pain Orientation: Right, Lower     BMI - recorded: 28.32 Nutritional Status: BMI 25 -29 Overweight  How often do you need to have someone help you when you read instructions, pamphlets, or other written materials from your doctor or pharmacy?: 1 - Never  Interpreter Needed?: No      Activities of Daily Living    07/10/2023    9:13 AM 11/21/2022    9:11 AM  In your present state of health, do you have any difficulty performing the following activities:  Hearing? 0 0  Vision? 0 0  Difficulty concentrating or making decisions? 1 0  Walking or climbing stairs? 1 0  Dressing or bathing? 0 0  Doing errands, shopping? 0 0  Preparing Food and eating ? N   Using the Toilet? N   In the past six months, have you accidently leaked urine? Y   Do you have problems with loss of bowel control? N   Managing your Medications? N   Managing your Finances? N   Housekeeping or managing your Housekeeping? N     Patient Care Team: Ronnald Ramp,  MD as PCP - General (Family Medicine) Galen Manila, MD as Referring Physician (Ophthalmology)  Indicate any recent Medical Services you may have received from other than [facility] providers in the past year (date may be approximate).     Assessment:   This is a routine wellness examination for Brittany.  Hearing/Vision screen No results found.   Goals Addressed   None   Depression Screen    07/10/2023    9:20 AM 05/15/2023    8:13 AM 11/21/2022    9:09 AM 08/15/2022    8:47 AM 07/02/2022    8:20 AM 12/25/2021    8:22 AM 11/28/2020    8:43 AM  PHQ 2/9 Scores  PHQ - 2 Score 0 0 0 0  0 0 0  PHQ- 9 Score 0 0 2 4 2 3 2     Fall Risk    07/10/2023    9:21 AM 11/21/2022    9:09 AM 08/15/2022    8:47 AM 07/02/2022    8:20 AM 12/25/2021    8:22 AM  Fall Risk   Falls in the past year? 1 0 0 0 0  Number falls in past yr: 0 0 0 0 0  Injury with Fall? 1 0 0 0 0  Risk for fall due to :  No Fall Risks No Fall Risks No Fall Risks Impaired balance/gait  Follow up  Falls evaluation completed   Falls evaluation completed    MEDICARE RISK AT HOME: Medicare Risk at Home Any stairs in or around the home?: Yes If so, are there any without handrails?: No Home free of loose throw rugs in walkways, pet beds, electrical cords, etc?: Yes Adequate lighting in your home to reduce risk of falls?: No Life alert?: No Use of a cane, walker or w/c?: No Grab bars in the bathroom?: No Shower chair or bench in shower?: No Elevated toilet seat or a handicapped toilet?: No  TIMED UP AND GO:  Was the test performed?  No    Cognitive Function:        07/02/2022    8:24 AM 12/10/2018    9:08 AM 08/28/2016    8:58 AM  6CIT Screen  What Year? 0 points 0 points 0 points  What month? 0 points 0 points 0 points  What time? 0 points 0 points 0 points  Count back from 20 0 points 0 points 0 points  Months in reverse 0 points 0 points 2 points  Repeat phrase 0 points 0 points 0 points  Total Score 0 points 0  points 2 points    Immunizations Immunization History  Administered Date(s) Administered   Fluad Quad(high Dose 65+) 05/07/2019, 06/15/2020, 05/02/2021, 07/02/2022   Fluad Trivalent(High Dose 65+) 05/15/2023   Influenza, High Dose Seasonal PF 05/04/2015, 05/14/2016, 05/08/2017, 04/03/2018   PFIZER(Purple Top)SARS-COV-2 Vaccination 08/18/2019, 09/08/2019, 06/02/2020   Pneumococcal Conjugate-13 12/23/2013   Pneumococcal Polysaccharide-23 07/24/1996, 08/04/2003    TDAP status: Due, Education has been provided regarding the importance of this vaccine. Advised may receive this vaccine at local pharmacy or Health Dept. Aware to provide a copy of the vaccination record if obtained from local pharmacy or Health Dept. Verbalized acceptance and understanding.  Flu Vaccine status: Due, Education has been provided regarding the importance of this vaccine. Advised may receive this vaccine at local pharmacy or Health Dept. Aware to provide a copy of the vaccination record if obtained from local pharmacy or Health Dept. Verbalized acceptance and understanding.  Pneumococcal vaccine status: Up to date    Qualifies for Shingles Vaccine? Yes    Shingrix Completed?: No.    Education has been provided regarding the importance of this vaccine. Patient has been advised to call insurance company to determine out of pocket expense if they have not yet received this vaccine. Advised may also receive vaccine at local pharmacy or Health Dept. Verbalized acceptance and understanding.  Screening Tests Health Maintenance  Topic Date Due   Zoster Vaccines- Shingrix (1 of 2) Never done   COVID-19 Vaccine (4 - 2024-25 season) 03/24/2023   Medicare Annual Wellness (AWV)  07/09/2024   DEXA SCAN  12/31/2024   Pneumonia Vaccine 50+ Years old  Completed   INFLUENZA VACCINE  Completed   HPV VACCINES  Aged  Out   DTaP/Tdap/Td  Discontinued    Health Maintenance  Health Maintenance Due  Topic Date Due   Zoster  Vaccines- Shingrix (1 of 2) Never done   COVID-19 Vaccine (4 - 2024-25 season) 03/24/2023    Colorectal cancer screening: No longer required.   Mammogram status: UTD, normal, Sept 25 2024  Bone Density status: Ordered and completed on 01/01/23.    Lung Cancer Screening Referral: n/a  Additional Screening:  Hepatitis C Screening: does not qualify;no labs available for review   Vision Screening: Recommended annual ophthalmology exams for early detection of glaucoma and other disorders of the eye. Is the patient up to date with their annual eye exam?  Yes  Who is the provider or what is the name of the office in which the patient attends annual eye exams? Dr. Druscilla Brownie   Dental Screening: Recommended annual dental exams for proper oral hygiene   Community Resource Referral / Chronic Care Management: CRR required this visit?  No   CCM required this visit?  No     Plan:     I have personally reviewed and noted the following in the patient's chart:   Medical and social history Use of alcohol, tobacco or illicit drugs  Current medications and supplements including opioid prescriptions. Patient is not currently taking opioid prescriptions. Functional ability and status Nutritional status Physical activity Advanced directives List of other physicians Hospitalizations, surgeries, and ER visits in previous 12 months Vitals Screenings to include cognitive, depression, and falls Referrals and appointments  In addition, I have reviewed and discussed with patient certain preventive protocols, quality metrics, and best practice recommendations. A written personalized care plan for preventive services as well as general preventive health recommendations were provided to patient.     Ronnald Ramp, MD   07/10/2023   After Visit Summary: (In Person-Printed) AVS printed and given to the patient

## 2023-07-11 LAB — CBC
Hematocrit: 34.3 % (ref 34.0–46.6)
Hemoglobin: 11 g/dL — ABNORMAL LOW (ref 11.1–15.9)
MCH: 28.7 pg (ref 26.6–33.0)
MCHC: 32.1 g/dL (ref 31.5–35.7)
MCV: 90 fL (ref 79–97)
Platelets: 281 x10E3/uL (ref 150–450)
RBC: 3.83 x10E6/uL (ref 3.77–5.28)
RDW: 12.5 % (ref 11.7–15.4)
WBC: 5.1 x10E3/uL (ref 3.4–10.8)

## 2023-07-11 LAB — COMPREHENSIVE METABOLIC PANEL
ALT: 14 [IU]/L (ref 0–32)
AST: 27 [IU]/L (ref 0–40)
Albumin: 4.2 g/dL (ref 3.7–4.7)
Alkaline Phosphatase: 64 [IU]/L (ref 44–121)
Bilirubin Total: 0.5 mg/dL (ref 0.0–1.2)
Globulin, Total: 2.4 g/dL (ref 1.5–4.5)
Total Protein: 6.6 g/dL (ref 6.0–8.5)

## 2023-07-11 LAB — TSH+T4F+T3FREE
Free T4: 1.41 ng/dL (ref 0.82–1.77)
T3, Free: 2.3 pg/mL (ref 2.0–4.4)
TSH: 3.87 u[IU]/mL (ref 0.450–4.500)

## 2023-07-11 LAB — BMP8+EGFR
BUN/Creatinine Ratio: 13 (ref 12–28)
BUN: 14 mg/dL (ref 8–27)
CO2: 27 mmol/L (ref 20–29)
Calcium: 9.8 mg/dL (ref 8.7–10.3)
Chloride: 96 mmol/L (ref 96–106)
Creatinine, Ser: 1.04 mg/dL — ABNORMAL HIGH (ref 0.57–1.00)
Glucose: 86 mg/dL (ref 70–99)
Potassium: 4.1 mmol/L (ref 3.5–5.2)
Sodium: 136 mmol/L (ref 134–144)
eGFR: 51 mL/min/{1.73_m2} — ABNORMAL LOW (ref >59)

## 2023-07-22 ENCOUNTER — Telehealth: Payer: Self-pay

## 2023-07-22 NOTE — Telephone Encounter (Signed)
Copied from CRM [id number]. Topic: General - Other >> Jul 19, 2023  3:11 PM Dominique A wrote: Reason for CRM: Pt is calling to check on her imaging results. Pt had imaging done on 07/10/2023. Please advise.

## 2023-07-22 NOTE — Telephone Encounter (Addendum)
Called and discussed with patient that her x-rays show predominantly degenerative changes.  Did discuss the suspected osseous foraminal encroachment in her cervical spine.  Encouraged patient to continue her range of motion exercises and discussed the possibility of physical therapy versus possible referral to orthopedics for specialist recommendations.  Patient preferred to wait and discuss options with her PCP at her upcoming appointment.

## 2023-07-31 ENCOUNTER — Ambulatory Visit: Payer: Medicare HMO | Admitting: Family Medicine

## 2023-07-31 DIAGNOSIS — H02831 Dermatochalasis of right upper eyelid: Secondary | ICD-10-CM | POA: Diagnosis not present

## 2023-07-31 DIAGNOSIS — H353131 Nonexudative age-related macular degeneration, bilateral, early dry stage: Secondary | ICD-10-CM | POA: Diagnosis not present

## 2023-07-31 DIAGNOSIS — M3501 Sicca syndrome with keratoconjunctivitis: Secondary | ICD-10-CM | POA: Diagnosis not present

## 2023-07-31 DIAGNOSIS — H43813 Vitreous degeneration, bilateral: Secondary | ICD-10-CM | POA: Diagnosis not present

## 2023-07-31 DIAGNOSIS — Z0100 Encounter for examination of eyes and vision without abnormal findings: Secondary | ICD-10-CM | POA: Diagnosis not present

## 2023-08-01 ENCOUNTER — Encounter: Payer: Self-pay | Admitting: Family Medicine

## 2023-08-01 ENCOUNTER — Ambulatory Visit (INDEPENDENT_AMBULATORY_CARE_PROVIDER_SITE_OTHER): Payer: Medicare HMO | Admitting: Family Medicine

## 2023-08-01 VITALS — BP 135/68 | HR 79 | Resp 16 | Wt 145.2 lb

## 2023-08-01 DIAGNOSIS — M47819 Spondylosis without myelopathy or radiculopathy, site unspecified: Secondary | ICD-10-CM

## 2023-08-01 DIAGNOSIS — I10 Essential (primary) hypertension: Secondary | ICD-10-CM | POA: Diagnosis not present

## 2023-08-01 DIAGNOSIS — M25511 Pain in right shoulder: Secondary | ICD-10-CM | POA: Diagnosis not present

## 2023-08-01 DIAGNOSIS — M150 Primary generalized (osteo)arthritis: Secondary | ICD-10-CM

## 2023-08-01 DIAGNOSIS — E7800 Pure hypercholesterolemia, unspecified: Secondary | ICD-10-CM

## 2023-08-01 DIAGNOSIS — M62838 Other muscle spasm: Secondary | ICD-10-CM

## 2023-08-01 DIAGNOSIS — E039 Hypothyroidism, unspecified: Secondary | ICD-10-CM | POA: Diagnosis not present

## 2023-08-01 DIAGNOSIS — M5030 Other cervical disc degeneration, unspecified cervical region: Secondary | ICD-10-CM | POA: Diagnosis not present

## 2023-08-01 DIAGNOSIS — M542 Cervicalgia: Secondary | ICD-10-CM

## 2023-08-01 MED ORDER — TRAMADOL HCL 50 MG PO TABS
50.0000 mg | ORAL_TABLET | Freq: Two times a day (BID) | ORAL | 0 refills | Status: AC | PRN
Start: 2023-08-01 — End: 2023-08-08

## 2023-08-01 MED ORDER — PREDNISONE 20 MG PO TABS
20.0000 mg | ORAL_TABLET | Freq: Every day | ORAL | 0 refills | Status: AC
Start: 2023-08-01 — End: 2023-08-06

## 2023-08-01 MED ORDER — TIZANIDINE HCL 4 MG PO TABS
4.0000 mg | ORAL_TABLET | Freq: Four times a day (QID) | ORAL | 0 refills | Status: DC | PRN
Start: 2023-08-01 — End: 2024-01-01

## 2023-08-01 NOTE — Patient Instructions (Signed)
 VISIT SUMMARY:  Today, we discussed your ongoing neck and back pain, which has been persistent despite current treatments. We also reviewed your blood pressure, osteoporosis, hypothyroidism, reflux, and cholesterol management. Your blood pressure has improved, and your other conditions are stable on current medications.  YOUR PLAN:  -DEGENERATIVE DISC DISEASE AND FACET JOINT DISEASE: Degenerative disc disease and facet joint disease are conditions where the discs and joints in your spine wear down over time, causing pain and stiffness. We will continue your current muscle relaxer, Tizanidine , and add Prednisone  for 5 days and Tramadol  as needed for severe pain. You should also continue using a heating pad and doing home exercises. If there is no improvement, we will consider a referral to neurosurgery for further pain management options.  -HYPERTENSION: Your blood pressure is currently well-controlled at 135/68 mmHg. Continue taking your current medication, Losartan , as prescribed.  -OSTEOPOROSIS: Osteoporosis is a condition where bones become weak and brittle. Continue taking Fosamax  once weekly to help strengthen your bones.  -HYPOTHYROIDISM: Hypothyroidism is when your thyroid  gland does not produce enough hormones. Your condition is well-managed with Levothyroxine , so continue taking it daily.  -GASTROESOPHAGEAL REFLUX DISEASE (GERD): GERD is a condition where stomach acid frequently flows back into the tube connecting your mouth and stomach. Continue taking Protonix  daily to manage your symptoms.  -HYPERCHOLESTEROLEMIA: Hypercholesterolemia is having high levels of cholesterol in the blood. Continue taking Simvastatin  daily to help manage your cholesterol levels.  INSTRUCTIONS:  Follow up in one month to reassess your neck, shoulder, and back pain. If your symptoms improve, you may call to cancel the appointment.

## 2023-08-01 NOTE — Assessment & Plan Note (Signed)
 Chronic  Well-managed on current medication. - Continue levothyroxine 50 mcg daily

## 2023-08-01 NOTE — Assessment & Plan Note (Signed)
 Chronic neck and back pain due to degenerative disc disease and facet joint disease. X-rays on 07/10/2023 showed degenerative changes without fractures or dislocations. Pain is described as aching and persistent, with limited range of motion in the right shoulder. Previous treatment with tizanidine  provided some relief. Discussed options for pain management including joint injections with steroids, neurosurgery consultation for non-surgical pain management options such as nerve blocks or pain units, and medications like tramadol  and prednisone . Patient prefers to try medications and home therapies before considering specialist referral. - Continue tizanidine  4 mg every six hours as needed - Prescribe prednisone  20 mg daily for 5 days - Prescribe tramadol  every 8-12 hours as needed for severe pain - Recommend continued use of heating pad and home exercises - Refer to neurosurgery for pain management options if no improvement - Follow up in one month to reassess pain and range of motion

## 2023-08-01 NOTE — Assessment & Plan Note (Signed)
 Blood pressure improved to 135/68 mmHg. Current management appears effective. -Chronic  - Continue current antihypertensive regimen

## 2023-08-01 NOTE — Progress Notes (Signed)
 Established patient visit   Patient: Lynn Williams   DOB: 09-Jul-1932   89 y.o. Female  MRN: [id number] Visit Date: 08/01/2023  Today's healthcare provider: Rockie Agent, MD   Chief Complaint  Patient presents with   Medical Management of Chronic Issues    HTN, Neck Pain, shoulder pain   Subjective     HPI     Medical Management of Chronic Issues    Additional comments: HTN, Neck Pain, shoulder pain      Last edited by Rosas, Joseline E, CMA on 08/01/2023  1:28 PM.       Discussed the use of AI scribe software for clinical note transcription with the patient, who gave verbal consent to proceed.  History of Present Illness   The patient, an 89 year old with a history of degenerative disc disease and facet joint disease, presents for a follow-up visit regarding persistent neck and back pain. The pain has remained consistent since the last visit. The patient has been on Tizanidine  4mg  every six hours, a muscle relaxer, which has not significantly alleviated the symptoms.  The patient reports an aching pain in the middle of the neck, radiating to the right shoulder. The pain is constant and is somewhat relieved by over-the-counter treatments such as Tylenol and topical applications like Icy Hot. The patient also reports limited range of motion in the right shoulder, unable to abduct the arm to even 90 degrees, which has affected daily activities.  The patient's blood pressure has improved, currently at 135/68. The patient continues to take 100mg  of Losartan  daily for hypertension, 25mg  of Hydrochlorothiazide  daily for osteoporosis, Fosamax  70mg  once weekly for hypothyroidism, 50mcg of Levothyroxine  daily, Protonix  40mg  daily for reflux, and Simvastatin  20mg  daily for hypercholesterolemia.  The patient's neck and back pain were previously evaluated with x-rays, which showed no fractures or dislocations but revealed degenerative changes consistent with arthritis.  The patient has not had any surgeries on the right shoulder, which is tender to touch and shows a limited range of motion.      Xrays reviewed for neck and back pain that showed multilevel degenerative disease and facet DJD   Past Medical History:  Diagnosis Date   Arthritis    hands and wrist and knees   Cough    chronic/allergies   GERD (gastroesophageal reflux disease)    Hyperlipidemia    Hypertension    controlled on meds   Hypothyroidism    Wears partial dentures    upper    Medications: Outpatient Medications Prior to Visit  Medication Sig   alendronate  (FOSAMAX ) 70 MG tablet TAKE 1 TABLET ONE TIME WEEKLY. TAKE WITH A FULL GLASS OF WATER  ON AN EMPTY STOMACH.   Calcium-Magnesium-Vitamin D (CALCIUM 500 PO) Take by mouth. am   cetirizine (ZYRTEC) 10 MG tablet Take by mouth as needed.    hydrochlorothiazide  (HYDRODIURIL ) 25 MG tablet TAKE 1 TABLET EVERY DAY   levothyroxine  (SYNTHROID ) 50 MCG tablet Take 1 tablet (50 mcg total) by mouth daily.   losartan  (COZAAR ) 100 MG tablet TAKE 1 TABLET EVERY DAY   Multiple Vitamins-Minerals (MULTIVITAMIN ADULT PO) Take by mouth. am   pantoprazole  (PROTONIX ) 40 MG tablet TAKE 1 TABLET EVERY DAY   simvastatin  (ZOCOR ) 20 MG tablet TAKE 1 TABLET AT BEDTIME   [DISCONTINUED] tiZANidine  (ZANAFLEX ) 4 MG tablet Take 1 tablet (4 mg total) by mouth every 6 (six) hours as needed for muscle spasms.   No facility-administered medications prior to visit.  Review of Systems  Last metabolic panel Lab Results  Component Value Date   GLUCOSE 86 07/10/2023   NA 136 07/10/2023   K 4.1 07/10/2023   CL 96 07/10/2023   CO2 27 07/10/2023   BUN 14 07/10/2023   CREATININE 1.04 (H) 07/10/2023   EGFR 51 (L) 07/10/2023   CALCIUM 9.8 07/10/2023   PROT 6.6 07/10/2023   ALBUMIN 4.2 07/10/2023   LABGLOB 2.4 07/10/2023   AGRATIO 1.8 07/02/2022   BILITOT 0.5 07/10/2023   ALKPHOS 64 07/10/2023   AST 27 07/10/2023   ALT 14 07/10/2023         Objective    BP 135/68 (BP Location: Left Arm, Patient Position: Sitting, Cuff Size: Normal)   Pulse 79   Resp 16   Wt 145 lb 3.2 oz (65.9 kg)   LMP  (LMP Unknown)   BMI 28.36 kg/m  BP Readings from Last 3 Encounters:  08/01/23 135/68  07/10/23 (!) 169/71  05/15/23 (!) 142/86   Wt Readings from Last 3 Encounters:  08/01/23 145 lb 3.2 oz (65.9 kg)  07/10/23 145 lb (65.8 kg)  05/15/23 148 lb (67.1 kg)        Physical Exam  Physical Exam   VITALS: BP- 135/68 CARDS: RRR  PULM: CTAB without wheezing  ABD: normal BS, nontender, nondistended MUSCULOSKELETAL: Limited range of motion in the right shoulder, unable to abduct beyond approximately 90 degrees. Tenderness upon palpation of the right shoulder. Prominence on the right side of the neck, possibly a cyst, not tender upon palpation. Limited extension of the neck observed. 5/5 strength in the upper extremities.       No results found for any visits on 08/01/23.  Assessment & Plan     Problem List Items Addressed This Visit       Cardiovascular and Mediastinum   Essential (primary) hypertension   Blood pressure improved to 135/68 mmHg. Current management appears effective. -Chronic  - Continue current antihypertensive regimen        Endocrine   Adult hypothyroidism   Chronic  Well-managed on current medication. - Continue levothyroxine  50 mcg daily        Musculoskeletal and Integument   RESOLVED: Trapezius muscle spasm   Facet joint disease   Relevant Medications   predniSONE  (DELTASONE ) 20 MG tablet   traMADol  (ULTRAM ) 50 MG tablet   tiZANidine  (ZANAFLEX ) 4 MG tablet   Arthritis, degenerative   Relevant Medications   predniSONE  (DELTASONE ) 20 MG tablet   traMADol  (ULTRAM ) 50 MG tablet   tiZANidine  (ZANAFLEX ) 4 MG tablet     Other   Hypercholesteremia   Managed with simvastatin . Chronic - Continue simvastatin  20 mg daily      Chronic bilateral low back pain without sciatica - Primary   Chronic  neck and back pain due to degenerative disc disease and facet joint disease. X-rays on 07/10/2023 showed degenerative changes without fractures or dislocations. Pain is described as aching and persistent, with limited range of motion in the right shoulder. Previous treatment with tizanidine  provided some relief. Discussed options for pain management including joint injections with steroids, neurosurgery consultation for non-surgical pain management options such as nerve blocks or pain units, and medications like tramadol  and prednisone . Patient prefers to try medications and home therapies before considering specialist referral. - Continue tizanidine  4 mg every six hours as needed - Prescribe prednisone  20 mg daily for 5 days - Prescribe tramadol  every 8-12 hours as needed for severe pain - Recommend continued use  of heating pad and home exercises - Refer to neurosurgery for pain management options if no improvement - Follow up in one month to reassess pain and range of motion      Relevant Medications   predniSONE  (DELTASONE ) 20 MG tablet   traMADol  (ULTRAM ) 50 MG tablet   tiZANidine  (ZANAFLEX ) 4 MG tablet   Acute pain of right shoulder   Relevant Medications   traMADol  (ULTRAM ) 50 MG tablet   Other Visit Diagnoses       Neck pain       Relevant Medications   tiZANidine  (ZANAFLEX ) 4 MG tablet      Chronic  Managed with Fosamax  70 mg once weekly. - Continue Fosamax  70 mg once weekly   Gastroesophageal Reflux Disease (GERD) Managed with Protonix . - Continue Protonix  40 mg daily      Return in about 1 month (around 09/01/2023) for neck and R shoulder .         Rockie Agent, MD  [facility] [phone] (phone) [phone] (fax)  [facility]

## 2023-08-01 NOTE — Assessment & Plan Note (Signed)
 Managed with simvastatin. Chronic - Continue simvastatin 20 mg daily

## 2023-09-04 ENCOUNTER — Ambulatory Visit: Payer: Medicare HMO | Admitting: Family Medicine

## 2023-09-04 ENCOUNTER — Encounter: Payer: Self-pay | Admitting: Family Medicine

## 2023-09-04 VITALS — BP 148/74 | HR 69 | Ht 60.0 in | Wt 146.7 lb

## 2023-09-04 DIAGNOSIS — M19019 Primary osteoarthritis, unspecified shoulder: Secondary | ICD-10-CM | POA: Diagnosis not present

## 2023-09-04 DIAGNOSIS — E7800 Pure hypercholesterolemia, unspecified: Secondary | ICD-10-CM

## 2023-09-04 DIAGNOSIS — I10 Essential (primary) hypertension: Secondary | ICD-10-CM | POA: Diagnosis not present

## 2023-09-04 NOTE — Assessment & Plan Note (Signed)
  Right Shoulder Pain Chronic right shoulder pain with 70-80% improvement since last visit. Pain exacerbated by use, radiates to neck. Previous x-ray showed AC joint degenerative changes. Current management includes Zanaflex, tramadol, and prednisone. Patient prefers to continue current management and reassess later. Declined steroid injection despite potential 3-6 months relief. Discussed orthopedic referral and neurosurgery injections if pain persists. - Continue Zanaflex 4 mg every six hours as needed - Continue tramadol as needed for severe pain - Consider orthopedic referral if pain persists - Consider neurosurgery injections if pain persists - Offer shoulder steroid injection if pain persists

## 2023-09-04 NOTE — Assessment & Plan Note (Signed)
Chronic uncontrolled hypertension. Current medications: hydrochlorothiazide 25 mg and losartan 100 mg. Blood pressure initially 172/72, improved to 148/74 after manual recheck. Patient does not monitor blood pressure at home. Discussed importance of home monitoring and target blood pressure under 150/90. - Encourage home blood pressure monitoring - Recheck blood pressure at next visit - continue current medications

## 2023-09-04 NOTE — Progress Notes (Signed)
Established patient visit   Patient: Lynn Williams   DOB: 04-09-1932   89 y.o. Female  MRN: [id number] Visit Date: 09/04/2023  Today's healthcare provider: Ronnald Ramp, MD   Chief Complaint  Patient presents with   Follow-up    1 month follow-up for neck and right shoulder. Patient reports pain is better than it was but still hurting.    Subjective     HPI     Follow-up    Additional comments: 1 month follow-up for neck and right shoulder. Patient reports pain is better than it was but still hurting.       Last edited by Acey Lav, CMA on 09/04/2023  8:46 AM.       Discussed the use of AI scribe software for clinical note transcription with the patient, who gave verbal consent to proceed.  History of Present Illness   Lynn Williams is an 89 year old female with chronic right shoulder pain who presents for follow-up.  She has experienced chronic right shoulder pain that has improved by approximately 70-80% since her last visit, though it remains present. The pain is intermittent, becoming noticeable with use, and is located in the shoulder, radiating up her neck. She is currently taking Zanaflex 4 mg every six hours as needed and has used tramadol and a short course of prednisone for pain and inflammation management. An x-ray from December revealed degenerative changes in the [facility] joint.  She has a history of chronic uncontrolled hypertension and is currently taking hydrochlorothiazide 25 mg and losartan 100 mg. Her blood pressure was elevated at 172/72 during the visit. No chest pain, shortness of breath, or palpitations.  She is on simvastatin 20 mg daily for hyperlipidemia. No acute issues related to hyperlipidemia were discussed during this visit.      Past Medical History:  Diagnosis Date   Arthritis    hands and wrist and knees   Cough    chronic/allergies   GERD (gastroesophageal reflux disease)    Hyperlipidemia    Hypertension     controlled on meds   Hypothyroidism    Wears partial dentures    upper    Medications: Outpatient Medications Prior to Visit  Medication Sig   alendronate (FOSAMAX) 70 MG tablet TAKE 1 TABLET ONE TIME WEEKLY. TAKE WITH A FULL GLASS OF WATER ON AN EMPTY STOMACH.   Calcium-Magnesium-Vitamin D (CALCIUM 500 PO) Take by mouth. am   cetirizine (ZYRTEC) 10 MG tablet Take by mouth as needed.    hydrochlorothiazide (HYDRODIURIL) 25 MG tablet TAKE 1 TABLET EVERY DAY   levothyroxine (SYNTHROID) 50 MCG tablet Take 1 tablet (50 mcg total) by mouth daily.   losartan (COZAAR) 100 MG tablet TAKE 1 TABLET EVERY DAY   Multiple Vitamins-Minerals (MULTIVITAMIN ADULT PO) Take by mouth. am   pantoprazole (PROTONIX) 40 MG tablet TAKE 1 TABLET EVERY DAY   simvastatin (ZOCOR) 20 MG tablet TAKE 1 TABLET AT BEDTIME   tiZANidine (ZANAFLEX) 4 MG tablet Take 1 tablet (4 mg total) by mouth every 6 (six) hours as needed for muscle spasms.   No facility-administered medications prior to visit.    Review of Systems  Last CBC Lab Results  Component Value Date   WBC 5.1 07/10/2023   HGB 11.0 (L) 07/10/2023   HCT 34.3 07/10/2023   MCV 90 07/10/2023   MCH 28.7 07/10/2023   RDW 12.5 07/10/2023   PLT 281 07/10/2023   Last metabolic panel Lab Results  Component Value Date   GLUCOSE 86 07/10/2023   NA 136 07/10/2023   K 4.1 07/10/2023   CL 96 07/10/2023   CO2 27 07/10/2023   BUN 14 07/10/2023   CREATININE 1.04 (H) 07/10/2023   EGFR 51 (L) 07/10/2023   CALCIUM 9.8 07/10/2023   PROT 6.6 07/10/2023   ALBUMIN 4.2 07/10/2023   LABGLOB 2.4 07/10/2023   AGRATIO 1.8 07/02/2022   BILITOT 0.5 07/10/2023   ALKPHOS 64 07/10/2023   AST 27 07/10/2023   ALT 14 07/10/2023   Last lipids Lab Results  Component Value Date   CHOL 138 07/02/2022   HDL 63 07/02/2022   LDLCALC 62 07/02/2022   TRIG 64 07/02/2022   CHOLHDL 2.2 07/02/2022   Last hemoglobin A1c No results found for: "HGBA1C" Last thyroid  functions Lab Results  Component Value Date   TSH 3.870 07/10/2023   Last vitamin D No results found for: "25OHVITD2", "25OHVITD3", "VD25OH"      Objective    BP (!) 148/74 (Cuff Size: Normal)   Pulse 69   Ht 5' (1.524 m)   Wt 146 lb 11.2 oz (66.5 kg)   LMP  (LMP Unknown)   SpO2 96%   BMI 28.65 kg/m  BP Readings from Last 3 Encounters:  09/04/23 (!) 148/74  08/01/23 135/68  07/10/23 (!) 169/71   Wt Readings from Last 3 Encounters:  09/04/23 146 lb 11.2 oz (66.5 kg)  08/01/23 145 lb 3.2 oz (65.9 kg)  07/10/23 145 lb (65.8 kg)    .    Physical Exam  Physical Exam   VITALS: BP- 148/74 CHEST: Lungs clear to auscultation, no wheezing, no crackles. CARDIOVASCULAR: Heart rhythm regular. MUSCULOSKELETAL: Right shoulder pain on movement, functional range of motion maintained.5/5 strength        No results found for any visits on 09/04/23.  Assessment & Plan     Problem List Items Addressed This Visit       Cardiovascular and Mediastinum   Essential (primary) hypertension   Chronic uncontrolled hypertension. Current medications: hydrochlorothiazide 25 mg and losartan 100 mg. Blood pressure initially 172/72, improved to 148/74 after manual recheck. Patient does not monitor blood pressure at home. Discussed importance of home monitoring and target blood pressure under 150/90. - Encourage home blood pressure monitoring - Recheck blood pressure at next visit - continue current medications         Musculoskeletal and Integument   Arthritis, degenerative - Primary      Right Shoulder Pain Chronic right shoulder pain with 70-80% improvement since last visit. Pain exacerbated by use, radiates to neck. Previous x-ray showed AC joint degenerative changes. Current management includes Zanaflex, tramadol, and prednisone. Patient prefers to continue current management and reassess later. Declined steroid injection despite potential 3-6 months relief. Discussed orthopedic  referral and neurosurgery injections if pain persists. - Continue Zanaflex 4 mg every six hours as needed - Continue tramadol as needed for severe pain - Consider orthopedic referral if pain persists - Consider neurosurgery injections if pain persists - Offer shoulder steroid injection if pain persists          General Health Maintenance Annual wellness visit scheduled for December. Next follow-up for chronic conditions and shoulder pain in June. - Schedule follow-up visit in June for blood pressure and shoulder pain - Annual wellness visit in December.         Return in about 4 months (around 01/02/2024) for HTN, r shoulder.  Ronnald Ramp, MD  [facility] [phone] (phone) [phone] (fax)  [facility]

## 2023-09-04 NOTE — Assessment & Plan Note (Signed)
Chronic hyperlipidemia managed with simvastatin 20 mg daily. No new concerns discussed. - Continue simvastatin 20 mg daily

## 2023-09-11 ENCOUNTER — Other Ambulatory Visit: Payer: Self-pay | Admitting: Family Medicine

## 2023-09-11 DIAGNOSIS — I10 Essential (primary) hypertension: Secondary | ICD-10-CM

## 2023-09-12 NOTE — Telephone Encounter (Signed)
 Requested Prescriptions  Pending Prescriptions Disp Refills   losartan (COZAAR) 100 MG tablet [Pharmacy Med Name: Losartan Potassium Oral Tablet 100 MG] 90 tablet 0    Sig: TAKE 1 TABLET EVERY DAY     Cardiovascular:  Angiotensin Receptor Blockers Failed - 09/12/2023 10:27 AM      Failed - Cr in normal range and within 180 days    Creatinine, Ser  Date Value Ref Range Status  07/10/2023 1.04 (H) 0.57 - 1.00 mg/dL Final         Failed - Last BP in normal range    BP Readings from Last 1 Encounters:  09/04/23 (!) 148/74         Passed - K in normal range and within 180 days    Potassium  Date Value Ref Range Status  07/10/2023 4.1 3.5 - 5.2 mmol/L Final  08/17/2014 3.2 (L) 3.5 - 5.1 mmol/L Final         Passed - Patient is not pregnant      Passed - Valid encounter within last 6 months    Recent Outpatient Visits           1 month ago Degenerative disc disease, cervical   [facility] Simmons-Robinson, Tawanna Cooler, MD   2 months ago Encounter for annual wellness visit (AWV) in Medicare patient   [facility] Simmons-Robinson, [place], MD   4 months ago Excessive cerumen in both ear canals   [facility] [place], [place], PA-C   9 months ago Adult hypothyroidism   [facility] Simmons-Robinson, [place], MD   1 year ago Essential (primary) hypertension   [facility] Simmons-Robinson, Financial controller, MD       Future Appointments             In 3 months Simmons-Robinson, Financial controller, MD [facility]   In 10 months Simmons-Robinson, [place], MD [facility]             simvastatin (ZOCOR) 20 MG tablet [Pharmacy Med Name: Simvastatin Oral Tablet 20 MG] 90 tablet 0    Sig: TAKE 1 TABLET AT BEDTIME     Cardiovascular:  Antilipid - Statins Failed - 09/12/2023 10:27 AM      Failed - Lipid Panel in  normal range within the last 12 months    Cholesterol, Total  Date Value Ref Range Status  07/02/2022 138 100 - 199 mg/dL Final   LDL Chol Calc (NIH)  Date Value Ref Range Status  07/02/2022 62 0 - 99 mg/dL Final   HDL  Date Value Ref Range Status  07/02/2022 63 >39 mg/dL Final   Triglycerides  Date Value Ref Range Status  07/02/2022 64 0 - 149 mg/dL Final         Passed - Patient is not pregnant      Passed - Valid encounter within last 12 months    Recent Outpatient Visits           1 month ago Degenerative disc disease, cervical   [facility] Simmons-Robinson, Tawanna Cooler, MD   2 months ago Encounter for annual wellness visit (AWV) in Medicare patient   [facility] Simmons-Robinson, [place], MD   4 months ago Excessive cerumen in both ear canals    [facility] [place], [place], PA-C   9 months ago Adult hypothyroidism   [facility] Simmons-Robinson, [place], MD   1 year ago Essential (primary) hypertension   [place] [facility] Simmons-Robinson, [place], MD       Future Appointments             In 3 months Simmons-Robinson, Tawanna Cooler, MD [facility], [facility]   In 10 months Simmons-Robinson, [place], MD [facility], [place]

## 2023-09-12 NOTE — Telephone Encounter (Signed)
 Requested medication (s) are due for refill today: yes  Requested medication (s) are on the active medication list: yes  Last refill:  06/28/23 #90  Future visit scheduled: yes  Notes to clinic:  overdue labs   Requested Prescriptions  Pending Prescriptions Disp Refills   simvastatin (ZOCOR) 20 MG tablet [Pharmacy Med Name: Simvastatin Oral Tablet 20 MG] 90 tablet     Sig: TAKE 1 TABLET AT BEDTIME     Cardiovascular:  Antilipid - Statins Failed - 09/12/2023 10:55 AM      Failed - Lipid Panel in normal range within the last 12 months    Cholesterol, Total  Date Value Ref Range Status  07/02/2022 138 100 - 199 mg/dL Final   LDL Chol Calc (NIH)  Date Value Ref Range Status  07/02/2022 62 0 - 99 mg/dL Final   HDL  Date Value Ref Range Status  07/02/2022 63 >39 mg/dL Final   Triglycerides  Date Value Ref Range Status  07/02/2022 64 0 - 149 mg/dL Final         Passed - Patient is not pregnant      Passed - Valid encounter within last 12 months    Recent Outpatient Visits           1 month ago Degenerative disc disease, cervical   [place] Simmons-Robinson, Tawanna Cooler, MD   2 months ago Encounter for annual wellness visit (AWV) in Medicare patient   [facility] Simmons-Robinson, [place], MD   4 months ago Excessive cerumen in both ear canals   [facility] [place], [employer], PA-C   9 months ago Adult hypothyroidism   [place] [facility] Simmons-Robinson, [place], MD   1 year ago Essential (primary) hypertension   [facility] Simmons-Robinson, Financial controller, MD       Future Appointments             In 3 months Simmons-Robinson, Financial controller, MD [facility]   In 10 months Simmons-Robinson, [place], MD [facility], [facility]            Signed Prescriptions Disp Refills   losartan (COZAAR) 100 MG  tablet 90 tablet 0    Sig: TAKE 1 TABLET EVERY DAY     Cardiovascular:  Angiotensin Receptor Blockers Failed - 09/12/2023 10:55 AM      Failed - Cr in normal range and within 180 days    Creatinine, Ser  Date Value Ref Range Status  07/10/2023 1.04 (H) 0.57 - 1.00 mg/dL Final         Failed - Last BP in normal range    BP Readings from Last 1 Encounters:  09/04/23 (!) 148/74         Passed - K in normal range and within 180 days    Potassium  Date Value Ref Range Status  07/10/2023 4.1 3.5 - 5.2 mmol/L Final  08/17/2014 3.2 (L) 3.5 - 5.1 mmol/L Final         Passed - Patient is not pregnant      Passed - Valid encounter within last 6 months    Recent Outpatient Visits           1 month ago Degenerative disc disease, cervical   [facility] Simmons-Robinson, Tawanna Cooler, MD   2 months ago Encounter for annual wellness visit (AWV) in Medicare patient    [facility]  Simmons-Robinson, Tawanna Cooler, MD   4 months ago Excessive cerumen in both ear canals   [place] [facility] [place], [place], [place]   9 months ago Adult hypothyroidism   [place] [facility] Simmons-Robinson, [place], MD   1 year ago Essential (primary) hypertension   [place] [facility] Simmons-Robinson, Tawanna Cooler, MD       Future Appointments             In 3 months Simmons-Robinson, Tawanna Cooler, MD [facility], [facility]   In 10 months Simmons-Robinson, [place], MD [facility], [place]

## 2023-09-18 ENCOUNTER — Other Ambulatory Visit: Payer: Self-pay | Admitting: Family Medicine

## 2023-09-18 DIAGNOSIS — E039 Hypothyroidism, unspecified: Secondary | ICD-10-CM

## 2023-09-18 NOTE — Telephone Encounter (Signed)
 Requested Prescriptions  Pending Prescriptions Disp Refills   levothyroxine (SYNTHROID) 50 MCG tablet [Pharmacy Med Name: Levothyroxine Sodium Oral Tablet 50 MCG] 90 tablet 0    Sig: TAKE 1 TABLET EVERY DAY     Endocrinology:  Hypothyroid Agents Passed - 09/18/2023  3:22 PM      Passed - TSH in normal range and within 360 days    TSH  Date Value Ref Range Status  07/10/2023 3.870 0.450 - 4.500 uIU/mL Final         Passed - Valid encounter within last 12 months    Recent Outpatient Visits           1 month ago Degenerative disc disease, cervical   [facility] Simmons-Robinson, [place], MD   2 months ago Encounter for annual wellness visit (AWV) in Medicare patient   [facility] Simmons-Robinson, [place], MD   4 months ago Excessive cerumen in both ear canals   [place] [facility] [place], [place], PA-C   10 months ago Adult hypothyroidism   [facility] Simmons-Robinson, [place], MD   1 year ago Essential (primary) hypertension   [place] [facility] Simmons-Robinson, Financial controller, MD       Future Appointments             In 3 months Simmons-Robinson, Tawanna Cooler, MD [facility]   In 9 months Simmons-Robinson, Financial controller, MD [facility], [place]

## 2023-10-07 ENCOUNTER — Other Ambulatory Visit: Payer: Self-pay | Admitting: Family Medicine

## 2023-10-07 DIAGNOSIS — M810 Age-related osteoporosis without current pathological fracture: Secondary | ICD-10-CM

## 2023-10-07 NOTE — Telephone Encounter (Signed)
 [facility] pharmacy is requesting refill alendronate (FOSAMAX) 70 MG tablet   Please advise

## 2023-10-09 MED ORDER — ALENDRONATE SODIUM 70 MG PO TABS: 70.0000 mg | ORAL_TABLET | ORAL | 3 refills | Status: DC

## 2023-10-09 NOTE — Telephone Encounter (Signed)
 LOV 2*12*25 NOV 6*11*25 LRF 1*61*09 LABS 60*45*40

## 2023-11-22 ENCOUNTER — Other Ambulatory Visit: Payer: Self-pay | Admitting: Family Medicine

## 2023-11-22 DIAGNOSIS — I10 Essential (primary) hypertension: Secondary | ICD-10-CM

## 2023-11-25 NOTE — Telephone Encounter (Signed)
 Labs in date.  Requested Prescriptions  Pending Prescriptions Disp Refills   losartan  (COZAAR ) 100 MG tablet [Pharmacy Med Name: Losartan  Potassium Oral Tablet 100 MG] 90 tablet 1    Sig: TAKE 1 TABLET EVERY DAY     Cardiovascular:  Angiotensin Receptor Blockers Failed - 11/25/2023  2:25 PM      Failed - Cr in normal range and within 180 days    Creatinine, Ser  Date Value Ref Range Status  07/10/2023 1.04 (H) 0.57 - 1.00 mg/dL Final         Failed - Last BP in normal range    BP Readings from Last 1 Encounters:  09/04/23 (!) 148/74         Passed - K in normal range and within 180 days    Potassium  Date Value Ref Range Status  07/10/2023 4.1 3.5 - 5.2 mmol/L Final  08/17/2014 3.2 (L) 3.5 - 5.1 mmol/L Final         Passed - Patient is not pregnant      Passed - Valid encounter within last 6 months    Recent Outpatient Visits           2 months ago Primary osteoarthritis of shoulder, unspecified laterality   [facility] Simmons-Robinson, Judyann [place], MD       Future Appointments             In 1 month Simmons-Robinson, Judyann Number, MD [facility]   In 7 months Simmons-Robinson, [place], MD [facility]             pantoprazole  (PROTONIX ) 40 MG tablet [Pharmacy Med Name: Pantoprazole  Sodium Oral Tablet Delayed Release 40 MG] 90 tablet 1    Sig: TAKE 1 TABLET EVERY DAY     Gastroenterology: Proton Pump Inhibitors Passed - 11/25/2023  2:25 PM      Passed - Valid encounter within last 12 months    Recent Outpatient Visits           2 months ago Primary osteoarthritis of shoulder, unspecified laterality   [place] [facility] Simmons-Robinson, Judyann Number, MD       Future Appointments             In 1 month Simmons-Robinson, Judyann Number, MD [facility]   In 7 months Simmons-Robinson, [place], MD [facility],  [place]

## 2023-11-30 ENCOUNTER — Other Ambulatory Visit: Payer: Self-pay | Admitting: Family Medicine

## 2023-11-30 DIAGNOSIS — E039 Hypothyroidism, unspecified: Secondary | ICD-10-CM

## 2023-12-02 NOTE — Telephone Encounter (Signed)
 Requested Prescriptions  Pending Prescriptions Disp Refills   levothyroxine  (SYNTHROID ) 50 MCG tablet [Pharmacy Med Name: Levothyroxine  Sodium Oral Tablet 50 MCG] 90 tablet 3    Sig: TAKE 1 TABLET EVERY DAY     Endocrinology:  Hypothyroid Agents Passed - 12/02/2023  4:12 PM      Passed - TSH in normal range and within 360 days    TSH  Date Value Ref Range Status  07/10/2023 3.870 0.450 - 4.500 uIU/mL Final         Passed - Valid encounter within last 12 months    Recent Outpatient Visits           2 months ago Primary osteoarthritis of shoulder, unspecified laterality   [place] [facility] Simmons-Robinson, Judyann Number, MD       Future Appointments             In 1 month Simmons-Robinson, Judyann Number, MD [facility]   In 7 months Simmons-Robinson, [place], MD [facility], [place]

## 2024-01-01 ENCOUNTER — Ambulatory Visit (INDEPENDENT_AMBULATORY_CARE_PROVIDER_SITE_OTHER): Payer: Medicare HMO | Admitting: Family Medicine

## 2024-01-01 ENCOUNTER — Encounter: Payer: Self-pay | Admitting: Family Medicine

## 2024-01-01 VITALS — BP 144/86 | HR 66 | Ht 60.0 in | Wt 154.0 lb

## 2024-01-01 DIAGNOSIS — M47819 Spondylosis without myelopathy or radiculopathy, site unspecified: Secondary | ICD-10-CM | POA: Diagnosis not present

## 2024-01-01 DIAGNOSIS — M19019 Primary osteoarthritis, unspecified shoulder: Secondary | ICD-10-CM

## 2024-01-01 DIAGNOSIS — M545 Low back pain, unspecified: Secondary | ICD-10-CM | POA: Diagnosis not present

## 2024-01-01 DIAGNOSIS — I10 Essential (primary) hypertension: Secondary | ICD-10-CM

## 2024-01-01 DIAGNOSIS — J3089 Other allergic rhinitis: Secondary | ICD-10-CM | POA: Diagnosis not present

## 2024-01-01 DIAGNOSIS — E7800 Pure hypercholesterolemia, unspecified: Secondary | ICD-10-CM | POA: Diagnosis not present

## 2024-01-01 DIAGNOSIS — M25472 Effusion, left ankle: Secondary | ICD-10-CM

## 2024-01-01 DIAGNOSIS — G8929 Other chronic pain: Secondary | ICD-10-CM | POA: Diagnosis not present

## 2024-01-01 DIAGNOSIS — E039 Hypothyroidism, unspecified: Secondary | ICD-10-CM | POA: Diagnosis not present

## 2024-01-01 NOTE — Progress Notes (Signed)
 Established patient visit   Patient: Lynn Williams   DOB: [birth year]   89 y.o. Female  MRN: [id number] Visit Date: 01/01/2024  Today's healthcare provider: Mimi Alt, MD   Chief Complaint  Patient presents with   Follow-up    Things are better with her R shoulder and lower back but still have pain from time to time    Subjective     HPI     Follow-up    Additional comments: Things are better with her R shoulder and lower back but still have pain from time to time       Last edited by Bart Lieu, CMA on 01/01/2024  9:11 AM.       Discussed the use of AI scribe software for clinical note transcription with the patient, who gave verbal consent to proceed.  History of Present Illness Lynn Williams is an 89 year old female with osteoporosis who presents for chronic follow-up.  She experiences intermittent right shoulder and lower back pain, which has shown some improvement. The pain exacerbates with overexertion, such as during grocery shopping. She uses a heating pad for relief and previously used tizanidine  4 mg, which was ineffective. Her medical history includes arthritis and degenerative disc disease, and there is suspicion of spinal stenosis due to pain radiating from the left lower quadrant to the back.  She has hypertension and is currently taking hydrochlorothiazide  25 mg daily and losartan  100 mg daily. A recent home blood pressure reading was 147/unknown, and she sometimes forgets to take her medication.  For hypothyroidism, she continues Synthroid  50 mcg daily. She also takes simvastatin  20 mg daily for hyperlipidemia and Protonix  40 mg daily for reflux, although she mentions she may not need it anymore.  She experiences swelling in her left ankle, which is not related to salt intake or previous surgery. The swelling improves with elevation and exercises. She uses cetirizine 10 mg as needed for allergic rhinitis symptoms and  continues to take a multivitamin.     Past Medical History:  Diagnosis Date   Arthritis    hands and wrist and knees   Cough    chronic/allergies   GERD (gastroesophageal reflux disease)    Hyperlipidemia    Hypertension    controlled on meds   Hypothyroidism    Wears partial dentures    upper    Medications: Outpatient Medications Prior to Visit  Medication Sig   alendronate  (FOSAMAX ) 70 MG tablet Take 1 tablet (70 mg total) by mouth once a week. Take with a full glass of water  on an empty stomach.   Calcium-Magnesium-Vitamin D (CALCIUM 500 PO) Take by mouth. am   cetirizine (ZYRTEC) 10 MG tablet Take by mouth as needed.    hydrochlorothiazide  (HYDRODIURIL ) 25 MG tablet TAKE 1 TABLET EVERY DAY   levothyroxine  (SYNTHROID ) 50 MCG tablet TAKE 1 TABLET EVERY DAY   losartan  (COZAAR ) 100 MG tablet TAKE 1 TABLET EVERY DAY   Multiple Vitamins-Minerals (MULTIVITAMIN ADULT PO) Take by mouth. am   pantoprazole  (PROTONIX ) 40 MG tablet TAKE 1 TABLET EVERY DAY   simvastatin  (ZOCOR ) 20 MG tablet TAKE 1 TABLET AT BEDTIME   [DISCONTINUED] tiZANidine  (ZANAFLEX ) 4 MG tablet Take 1 tablet (4 mg total) by mouth every 6 (six) hours as needed for muscle spasms. (Patient not taking: Reported on 01/01/2024)   No facility-administered medications prior to visit.    Review of Systems  Last CBC Lab Results  Component Value Date  WBC 5.1 07/10/2023   HGB 11.0 (L) 07/10/2023   HCT 34.3 07/10/2023   MCV 90 07/10/2023   MCH 28.7 07/10/2023   RDW 12.5 07/10/2023   PLT 281 07/10/2023   Last metabolic panel Lab Results  Component Value Date   GLUCOSE 86 07/10/2023   NA 136 07/10/2023   K 4.1 07/10/2023   CL 96 07/10/2023   CO2 27 07/10/2023   BUN 14 07/10/2023   CREATININE 1.04 (H) 07/10/2023   EGFR 51 (L) 07/10/2023   CALCIUM 9.8 07/10/2023   PROT 6.6 07/10/2023   ALBUMIN 4.2 07/10/2023   LABGLOB 2.4 07/10/2023   AGRATIO 1.8 07/02/2022   BILITOT 0.5 07/10/2023   ALKPHOS 64  07/10/2023   AST 27 07/10/2023   ALT 14 07/10/2023   Last lipids Lab Results  Component Value Date   CHOL 138 07/02/2022   HDL 63 07/02/2022   LDLCALC 62 07/02/2022   TRIG 64 07/02/2022   CHOLHDL 2.2 07/02/2022   Last hemoglobin A1c No results found for: HGBA1C Last thyroid  functions Lab Results  Component Value Date   TSH 3.870 07/10/2023   Last vitamin D No results found for: 25OHVITD2, 25OHVITD3, VD25OH Last vitamin B12 and Folate No results found for: VITAMINB12, FOLATE      Objective    BP (!) 144/86 (Cuff Size: Normal)   Pulse 66   Ht 5' (1.524 m)   Wt 154 lb (69.9 kg)   LMP  (LMP Unknown)   SpO2 98%   BMI 30.08 kg/m  BP Readings from Last 3 Encounters:  01/01/24 (!) 144/86  09/04/23 (!) 148/74  08/01/23 135/68   Wt Readings from Last 3 Encounters:  01/01/24 154 lb (69.9 kg)  09/04/23 146 lb 11.2 oz (66.5 kg)  08/01/23 145 lb 3.2 oz (65.9 kg)        Physical Exam Vitals reviewed.  Constitutional:      General: She is not in acute distress.    Appearance: Normal appearance. She is not ill-appearing.  Cardiovascular:     Rate and Rhythm: Normal rate and regular rhythm.  Pulmonary:     Effort: Pulmonary effort is normal. No respiratory distress.     Breath sounds: No wheezing, rhonchi or rales.  Neurological:     Mental Status: She is alert and oriented to person, place, and time.  Psychiatric:        Mood and Affect: Mood normal.        Behavior: Behavior normal.     Physical Exam VITALS: BP- 144/86 MUSCULOSKELETAL: Right shoulder range of motion intact. Right shoulder strength 5/5. Right shoulder non-tender. Right deltoid tenderness present. No erythema or deformities noted of right shoulder     No results found for any visits on 01/01/24.  Assessment & Plan     Problem List Items Addressed This Visit       Cardiovascular and Mediastinum   Essential (primary) hypertension - Primary     Respiratory   Allergic rhinitis      Endocrine   Adult hypothyroidism     Musculoskeletal and Integument   Facet joint disease   Arthritis, degenerative     Other   Hypercholesteremia   Chronic bilateral low back pain without sciatica   Other Visit Diagnoses       Edema of left ankle            Assessment & Plan Right Shoulder Pain Intermittent right shoulder pain with improvement. Exacerbated by overuse, such as grocery shopping.  Good range of motion, tenderness in the deltoid area. Previous tizanidine  use was ineffective. Prefers conservative measures. Tramadol  may be considered for severe pain, orthopedic evaluation if symptoms worsen. - Discontinue tizanidine  - Continue use of heating pad for pain relief - Consider tramadol  for severe pain - Offer referral to orthopedics if pain worsens  Lower Back Pain Chronic lower back pain with intermittent exacerbations, possibly related to degenerative disc disease and facet arthritis. Worsens with standing and activity, improves with rest. Prefers conservative management. Consider orthopedic or neurosurgical evaluation if symptoms worsen. Injections and surgery discussed as future options if conservative measures fail. - Continue use of heating pad and lidocaine  patches for pain relief - Consider tramadol  for severe pain - Offer referral to orthopedics or neurosurgery if pain worsens  Hypertension Blood pressure elevated at 162/71 in office. Home readings variable with a recent reading of 147/?. Current medications include hydrochlorothiazide  and losartan . Unable to recall if medication was taken today. Goal is to maintain blood pressure below 150/90. - Continue hydrochlorothiazide  25 mg daily - Continue losartan  100 mg daily - Recheck blood pressure  Osteoporosis Ongoing management with no recent fractures reported. - Continue Fosamax  70 mg weekly - Continue calcium and vitamin D supplementation  Hypothyroidism Well-managed on current medication regimen. -  Continue Synthroid  50 mcg daily  Hyperlipidemia Well-managed on current medication regimen. - Continue simvastatin  20 mg daily  Peripheral Edema Intermittent left ankle swelling. Possible causes include salt intake, activity level, or anatomical variation. No history of surgery on the affected limb. Swelling improves with elevation and exercise. Compression stockings and elevation recommended. - Consider use of compression stockings - Elevate legs to reduce swelling  Allergic Rhinitis Symptoms managed with as-needed medication. - Continue cetirizine 10 mg as needed  Gastroesophageal Reflux Disease (GERD) Symptoms managed with current medication. May not need medication daily. - Continue Protonix  40 mg daily as needed     No follow-ups on file.         Mimi Alt, MD  [facility] [phone] (phone) [phone] (fax)  [facility]

## 2024-03-06 ENCOUNTER — Ambulatory Visit: Payer: Self-pay | Admitting: *Deleted

## 2024-03-06 DIAGNOSIS — S42292A Other displaced fracture of upper end of left humerus, initial encounter for closed fracture: Secondary | ICD-10-CM | POA: Diagnosis not present

## 2024-03-06 NOTE — Telephone Encounter (Signed)
 Agree with [facility] evaluation

## 2024-03-06 NOTE — Telephone Encounter (Signed)
 FYI Only or Action Required?: Action required by provider: request for appointment.  Patient was last seen in primary care on 01/01/2024 by Sharma Coyer, MD.  Called Nurse Triage reporting Shoulder Pain and Shoulder Injury.  Symptoms began today.  Interventions attempted: Rest, hydration, or home remedies.  Symptoms are: rapidly worsening.  Triage Disposition: Go to ED Now (or PCP Triage)  Patient/caregiver understands and will follow disposition?: No, wishes to speak with PCP               Copied from CRM #[id number]. Topic: Clinical - Red Word Triage >> Mar 06, 2024  9:03 AM Wess RAMAN wrote: Red Word that prompted transfer to Nurse Triage: Patient's daughter, Monta, stated her mother tripped over a rug and fell, causing pain to her left shoulder. Pain level 10 Reason for Disposition  [1] New numbness (loss of sensation) or weakness of hand or finger(s) AND [2] present now  Answer Assessment - Initial Assessment Questions Recommended ED now. Patient daughter reports they do not want to sit in ED all day for hours. Daughter and patient agreed [facility]. Patient wants appt with PCP. Please advise.      1. MECHANISM: How did the injury happen?     Tripped over throw rug this am unwitnessed 2. ONSET: When did the injury happen? (e.g., minutes, hours ago)      This am per patient daughter  3. APPEARANCE of INJURY: What does the injury look like?      Can not tell 4. SEVERITY: Can you move the shoulder normally?      no 5. SIZE: For cuts, bruises, or swelling, ask: How large is it? (e.g., inches or centimeters;  entire joint)      denies 6. PAIN: Is there pain? If Yes, ask: How bad is the pain?  (Scale 0-10; or none, mild, moderate, severe)     severe 7. TETANUS: For any breaks in the skin, ask: When was your last tetanus booster?     na 8. OTHER SYMPTOMS: Do you have any other symptoms? (e.g., loss of sensation)      Numbness in fingers can not lift arm left side  9. PREGNANCY: Is there any chance you are pregnant? When was your last menstrual period?     na  Protocols used: Shoulder Injury-A-AH

## 2024-03-20 DIAGNOSIS — S42232A 3-part fracture of surgical neck of left humerus, initial encounter for closed fracture: Secondary | ICD-10-CM | POA: Diagnosis not present

## 2024-03-20 DIAGNOSIS — S43032A Inferior subluxation of left humerus, initial encounter: Secondary | ICD-10-CM | POA: Diagnosis not present

## 2024-03-20 DIAGNOSIS — S42292A Other displaced fracture of upper end of left humerus, initial encounter for closed fracture: Secondary | ICD-10-CM | POA: Diagnosis not present

## 2024-03-25 ENCOUNTER — Other Ambulatory Visit: Payer: Self-pay | Admitting: Family Medicine

## 2024-03-25 DIAGNOSIS — I10 Essential (primary) hypertension: Secondary | ICD-10-CM

## 2024-04-10 DIAGNOSIS — S42232D 3-part fracture of surgical neck of left humerus, subsequent encounter for fracture with routine healing: Secondary | ICD-10-CM | POA: Diagnosis not present

## 2024-04-17 DIAGNOSIS — S42292D Other displaced fracture of upper end of left humerus, subsequent encounter for fracture with routine healing: Secondary | ICD-10-CM | POA: Diagnosis not present

## 2024-04-19 ENCOUNTER — Other Ambulatory Visit: Payer: Self-pay | Admitting: Family Medicine

## 2024-04-19 DIAGNOSIS — I10 Essential (primary) hypertension: Secondary | ICD-10-CM

## 2024-04-23 DIAGNOSIS — S42292D Other displaced fracture of upper end of left humerus, subsequent encounter for fracture with routine healing: Secondary | ICD-10-CM | POA: Diagnosis not present

## 2024-04-26 ENCOUNTER — Other Ambulatory Visit: Payer: Self-pay | Admitting: Family Medicine

## 2024-04-26 DIAGNOSIS — E039 Hypothyroidism, unspecified: Secondary | ICD-10-CM

## 2024-04-29 DIAGNOSIS — S42292D Other displaced fracture of upper end of left humerus, subsequent encounter for fracture with routine healing: Secondary | ICD-10-CM | POA: Diagnosis not present

## 2024-05-06 DIAGNOSIS — S42292D Other displaced fracture of upper end of left humerus, subsequent encounter for fracture with routine healing: Secondary | ICD-10-CM | POA: Diagnosis not present

## 2024-05-08 DIAGNOSIS — S42292A Other displaced fracture of upper end of left humerus, initial encounter for closed fracture: Secondary | ICD-10-CM | POA: Diagnosis not present

## 2024-05-14 DIAGNOSIS — S42292D Other displaced fracture of upper end of left humerus, subsequent encounter for fracture with routine healing: Secondary | ICD-10-CM | POA: Diagnosis not present

## 2024-05-21 DIAGNOSIS — M545 Low back pain, unspecified: Secondary | ICD-10-CM | POA: Diagnosis not present

## 2024-05-27 DIAGNOSIS — S42292D Other displaced fracture of upper end of left humerus, subsequent encounter for fracture with routine healing: Secondary | ICD-10-CM | POA: Diagnosis not present

## 2024-06-01 ENCOUNTER — Telehealth: Payer: Self-pay | Admitting: Family Medicine

## 2024-06-01 ENCOUNTER — Ambulatory Visit: Payer: Self-pay

## 2024-06-01 NOTE — Telephone Encounter (Signed)
 Converting to Triage Encounter

## 2024-06-01 NOTE — Telephone Encounter (Unsigned)
 Copied from CRM [id number]. Topic: Clinical - Medication Refill >> Jun 01, 2024  1:03 PM Rosaria E wrote: Medication: traMADol  (ULTRAM ) 50 MG tablet   Has the patient contacted their pharmacy? Yes (Agent: If no, request that the patient contact the pharmacy for the refill. If patient does not wish to contact the pharmacy document the reason why and proceed with request.) (Agent: If yes, when and what did the pharmacy advise?)  This is the patient's preferred pharmacy:  [id number] - [place], [place] - [address] [place] [address] Phone: [phone] Fax: [phone]  Is this the correct pharmacy for this prescription? Yes If no, delete pharmacy and type the correct one.   Has the prescription been filled recently? Yes  Is the patient out of the medication? Yes  Has the patient been seen for an appointment in the last year OR does the patient have an upcoming appointment? Yes  Can we respond through MyChart? Yes  Agent: Please be advised that Rx refills may take up to 3 business days. We ask that you follow-up with your pharmacy.

## 2024-06-01 NOTE — Telephone Encounter (Signed)
 FYI Only or Action Required?: FYI only for provider: appointment scheduled on 11/12.  Patient was last seen in primary care on 01/01/2024 by Sharma Coyer, MD.  Called Nurse Triage reporting No chief complaint on file..  Symptoms began several weeks ago.  Interventions attempted: Prescription medications: Medrol Dosepak and Rest, hydration, or home remedies.  Symptoms are: gradually worsening.  Triage Disposition: See PCP Within 2 Weeks  Patient/caregiver understands and will follow disposition?: Yes  Copied from CRM [id number]. Topic: Clinical - Medication Refill >> Jun 01, 2024  1:03 PM Rosaria E wrote: Medication: traMADol  (ULTRAM ) 50 MG tablet    This is the patient's preferred pharmacy:  [id number] - [place], [place] - [address] [place] [address] Phone: [phone] Fax: [phone]  Reason for Disposition  [1] Fall AND [2] went to emergency department for evaluation or treatment  Answer Assessment - Initial Assessment Questions Clemens back in August- since then she has been having back and knee pain. Sees [facility]. Given Medrol Dosepak to help with no benefit. States PCP has given her Tramadol  for back pain in the past. Advised we need provider to fully assess. Appt made with PCP and she will make sure she has a ride. ED/UC precautions understood  1. MECHANISM: How did the fall happen?     Tripped over a throw rug that is since been removed 2. DOMESTIC VIOLENCE AND ELDER ABUSE SCREENING: Did you fall because someone pushed you or tried to hurt you? If Yes, ask: Are you safe now?     denies 3. ONSET: When did the fall happen? (e.g., minutes, hours, or days ago)     Back in August 4. LOCATION: What part of the body hit the ground? (e.g., back, buttocks, head, hips, knees, hands, head, stomach)     Bilateral knees and back  5. INJURY: Did you hurt (injure) yourself when you fell? If Yes, ask: What did you injure? Tell me more  about this? (e.g., body area; type of injury; pain severity)     Subluxed her shoulder 6. PAIN: Is there any pain? If Yes, ask: How bad is the pain? (e.g., Scale 0-10; or none, mild,      10/10 pain  7. SIZE: For cuts, bruises, or swelling, ask: How large is it? (e.g., inches or centimeters)      Unsure  9. OTHER SYMPTOMS: Do you have any other symptoms? (e.g., dizziness, fever, weakness; new-onset or worsening).      Mild swellin in knees 10. CAUSE: What do you think caused the fall (or falling)? (e.g., dizzy spell, tripped)       Tripped over a rug  Protocols used: Falls and [facility]

## 2024-06-03 ENCOUNTER — Encounter: Payer: Self-pay | Admitting: Family Medicine

## 2024-06-03 ENCOUNTER — Ambulatory Visit (INDEPENDENT_AMBULATORY_CARE_PROVIDER_SITE_OTHER): Admitting: Family Medicine

## 2024-06-03 VITALS — BP 118/69 | HR 78 | Ht 60.0 in | Wt 142.6 lb

## 2024-06-03 DIAGNOSIS — M545 Low back pain, unspecified: Secondary | ICD-10-CM

## 2024-06-03 DIAGNOSIS — Z23 Encounter for immunization: Secondary | ICD-10-CM

## 2024-06-03 DIAGNOSIS — M170 Bilateral primary osteoarthritis of knee: Secondary | ICD-10-CM

## 2024-06-03 DIAGNOSIS — G8929 Other chronic pain: Secondary | ICD-10-CM

## 2024-06-03 MED ORDER — TRAMADOL HCL 50 MG PO TABS: 50.0000 mg | ORAL_TABLET | Freq: Three times a day (TID) | ORAL | 0 refills | Status: DC | PRN

## 2024-06-03 NOTE — Patient Instructions (Signed)
 To keep you healthy, please keep in mind the following health maintenance items that you are due for:   Health Maintenance Due  Topic Date Due   Zoster Vaccines- Shingrix (1 of 2) Never done   Influenza Vaccine  02/21/2024   Medicare Annual Wellness (AWV)  07/09/2024     Best Wishes,   Dr. Lang

## 2024-06-03 NOTE — Progress Notes (Signed)
 Established patient visit   Patient: Lynn Williams   DOB: 1932/01/19   89 y.o. Female  MRN: [id number] Visit Date: 06/03/2024  Today's healthcare provider: Rockie Agent, MD   Chief Complaint  Patient presents with   Knee Pain    Has seen [facility] for fall in August, has been having knee and back pian since fall. Had xrays of back but none of knee. Bilateral knee pain. Back pain is bilateral lower moving to left hip and knee. Pain is constant per patient. Patient had 2 leftover tramadol  that seemed to help with pain    Back Pain   Ear Fullness    Both ears are feeing full, would like to have these checked.    Subjective     HPI     Knee Pain    Additional comments: Has seen [facility] for fall in August, has been having knee and back pian since fall. Had xrays of back but none of knee. Bilateral knee pain. Back pain is bilateral lower moving to left hip and knee. Pain is constant per patient. Patient had 2 leftover tramadol  that seemed to help with pain         Ear Fullness    Additional comments: Both ears are feeing full, would like to have these checked.       Last edited by Cherry Chiquita HERO, CMA on 06/03/2024  1:04 PM.       Discussed the use of AI scribe software for clinical note transcription with the patient, who gave verbal consent to proceed.  History of Present Illness Lynn Williams is a 89 year old female who presents for an acute visit to receive her influenza vaccine and address knee and back pain.  She has been experiencing bilateral knee and back pain since a fall in August. The back pain is bilateral and radiates to her left hip, while the knee pain is constant. The severity of the pain makes standing and walking difficult. She has been evaluated by an orthopedist and had x-rays taken of her back and knees. A prednisone  pack was prescribed, but it did not alleviate her symptoms. She is currently undergoing physical therapy  once a week and uses topical treatments like Salonpas, which are no longer effective.  She takes tramadol  50 mg, one tablet every 12 hours as needed, which provides some relief. Recently, she took two tablets over two days due to increased pain.  She also experiences a sensation of fullness in her ears, which occurs annually and usually requires cleaning. Her hearing is affected, and her ears feel 'kind of stocked up.' No ear pain, but she reports her ears feel 'kind of stocked up.'     Past Medical History:  Diagnosis Date   Arthritis    hands and wrist and knees   Cough    chronic/allergies   GERD (gastroesophageal reflux disease)    Hyperlipidemia    Hypertension    controlled on meds   Hypothyroidism    Wears partial dentures    upper    Medications: Outpatient Medications Prior to Visit  Medication Sig   alendronate  (FOSAMAX ) 70 MG tablet Take 1 tablet (70 mg total) by mouth once a week. Take with a full glass of water  on an empty stomach.   Calcium-Magnesium-Vitamin D (CALCIUM 500 PO) Take by mouth. am   cetirizine (ZYRTEC) 10 MG tablet Take by mouth as needed.    hydrochlorothiazide  (HYDRODIURIL ) 25 MG tablet  TAKE 1 TABLET EVERY DAY   levothyroxine  (SYNTHROID ) 50 MCG tablet TAKE 1 TABLET EVERY DAY   losartan  (COZAAR ) 100 MG tablet TAKE 1 TABLET EVERY DAY   Multiple Vitamins-Minerals (MULTIVITAMIN ADULT PO) Take by mouth. am   pantoprazole  (PROTONIX ) 40 MG tablet TAKE 1 TABLET EVERY DAY   simvastatin  (ZOCOR ) 20 MG tablet TAKE 1 TABLET AT BEDTIME   vitamin B-12 (CYANOCOBALAMIN) 50 MCG tablet Take 50 mcg by mouth daily.   No facility-administered medications prior to visit.    Review of Systems      Objective    BP 118/69 (BP Location: Left Arm, Patient Position: Sitting, Cuff Size: Normal)   Pulse 78   Ht 5' (1.524 m)   Wt 142 lb 9.6 oz (64.7 kg)   LMP  (LMP Unknown)   SpO2 98%   BMI 27.85 kg/m      Physical Exam  Physical Exam VITALS: BP-  118/69 HEENT: Cerumen impaction in both ears. MSK: tenderness of both knees, pain with extension and flexion of bilateral knees, pain of left lower extremity, pain with motion (flexion of back muscles)    No results found for any visits on 06/03/24.  Assessment & Plan     Problem List Items Addressed This Visit     Chronic bilateral low back pain without sciatica   Relevant Medications   traMADol  (ULTRAM ) 50 MG tablet   Other Visit Diagnoses       Immunization due    -  Primary   Relevant Orders   Flu vaccine HIGH DOSE PF(Fluzone Trivalent) (Completed)     Bilateral primary osteoarthritis of knee       Relevant Medications   traMADol  (ULTRAM ) 50 MG tablet       Assessment and Plan Assessment & Plan Encounter for influenza vaccination 89 year old female presenting for influenza vaccination. - Administered influenza vaccine.  Bilateral knee osteoarthritis and chronic low back pain Chronic bilateral knee osteoarthritis and low back pain with constant pain exacerbated by movement. Previous x-rays and orthopedic evaluation. Prednisone  pack was ineffective. Currently on tramadol  50 mg every 12 hours as needed. Physical therapy ongoing. Discussed potential for knee steroid injections for temporary relief, with caution against frequent injections due to potential need for surgical intervention if ineffective. Emphasized importance of maintaining mobility to prevent stiffness. - Refilled tramadol  50 mg every 12 hours as needed. - Continue physical therapy and home exercise program. - Follow up with orthopedics for further evaluation of back and knees. - Will consider knee steroid injections if pain persists.  Impacted cerumen, bilateral Bilateral impacted cerumen causing ear fullness and decreased hearing.  - Use Debrox drops daily for one week to soften cerumen. - in office ear lavage completed, removal of majority of cerumen     Return if symptoms worsen or fail to improve.          Rockie Agent, MD  [facility] [phone] (phone) [phone] (fax)  [facility]

## 2024-06-03 NOTE — Telephone Encounter (Signed)
 Patient call note and symptoms reviewed. Agree with scheduled appt. Will evaluate during OV

## 2024-06-04 DIAGNOSIS — S42292D Other displaced fracture of upper end of left humerus, subsequent encounter for fracture with routine healing: Secondary | ICD-10-CM | POA: Diagnosis not present

## 2024-06-05 DIAGNOSIS — M7062 Trochanteric bursitis, left hip: Secondary | ICD-10-CM | POA: Diagnosis not present

## 2024-06-05 DIAGNOSIS — Z4789 Encounter for other orthopedic aftercare: Secondary | ICD-10-CM | POA: Diagnosis not present

## 2024-06-05 DIAGNOSIS — Z8711 Personal history of peptic ulcer disease: Secondary | ICD-10-CM | POA: Diagnosis not present

## 2024-06-05 DIAGNOSIS — S42352D Displaced comminuted fracture of shaft of humerus, left arm, subsequent encounter for fracture with routine healing: Secondary | ICD-10-CM | POA: Diagnosis not present

## 2024-06-11 DIAGNOSIS — S42292D Other displaced fracture of upper end of left humerus, subsequent encounter for fracture with routine healing: Secondary | ICD-10-CM | POA: Diagnosis not present

## 2024-06-16 DIAGNOSIS — S42292D Other displaced fracture of upper end of left humerus, subsequent encounter for fracture with routine healing: Secondary | ICD-10-CM | POA: Diagnosis not present

## 2024-07-02 ENCOUNTER — Other Ambulatory Visit: Payer: Self-pay | Admitting: Family Medicine

## 2024-07-02 DIAGNOSIS — E7800 Pure hypercholesterolemia, unspecified: Secondary | ICD-10-CM

## 2024-07-08 ENCOUNTER — Ambulatory Visit: Payer: Medicare HMO | Admitting: Family Medicine

## 2024-07-08 ENCOUNTER — Encounter: Payer: Self-pay | Admitting: Family Medicine

## 2024-07-08 VITALS — BP 142/72 | HR 67 | Ht 60.0 in | Wt 140.7 lb

## 2024-07-08 DIAGNOSIS — E7800 Pure hypercholesterolemia, unspecified: Secondary | ICD-10-CM | POA: Diagnosis not present

## 2024-07-08 DIAGNOSIS — E039 Hypothyroidism, unspecified: Secondary | ICD-10-CM

## 2024-07-08 DIAGNOSIS — G8929 Other chronic pain: Secondary | ICD-10-CM | POA: Diagnosis not present

## 2024-07-08 DIAGNOSIS — I10 Essential (primary) hypertension: Secondary | ICD-10-CM | POA: Diagnosis not present

## 2024-07-08 DIAGNOSIS — R34 Anuria and oliguria: Secondary | ICD-10-CM | POA: Insufficient documentation

## 2024-07-08 DIAGNOSIS — Z130 Encounter for screening for diseases of the blood and blood-forming organs and certain disorders involving the immune mechanism: Secondary | ICD-10-CM | POA: Diagnosis not present

## 2024-07-08 DIAGNOSIS — Z0000 Encounter for general adult medical examination without abnormal findings: Secondary | ICD-10-CM

## 2024-07-08 DIAGNOSIS — Z131 Encounter for screening for diabetes mellitus: Secondary | ICD-10-CM

## 2024-07-08 DIAGNOSIS — M545 Low back pain, unspecified: Secondary | ICD-10-CM | POA: Diagnosis not present

## 2024-07-08 DIAGNOSIS — M25561 Pain in right knee: Secondary | ICD-10-CM

## 2024-07-08 DIAGNOSIS — F5101 Primary insomnia: Secondary | ICD-10-CM | POA: Diagnosis not present

## 2024-07-08 DIAGNOSIS — Z13220 Encounter for screening for lipoid disorders: Secondary | ICD-10-CM

## 2024-07-08 MED ORDER — PREDNISONE 10 MG PO TABS: ORAL_TABLET | ORAL | 0 refills | Status: AC

## 2024-07-08 MED ORDER — TRAMADOL HCL 50 MG PO TABS: 50.0000 mg | ORAL_TABLET | Freq: Three times a day (TID) | ORAL | 0 refills | Status: AC | PRN

## 2024-07-08 NOTE — Patient Instructions (Addendum)
°  ° ° ° ° ° ° ° ° ° ° ° ° °  To keep you healthy, please keep in mind the following health maintenance items that you are due for:   Health Maintenance Due  Topic Date Due   Zoster Vaccines- Shingrix (1 of 2) Never done   Medicare Annual Wellness (AWV)  07/09/2024     Best Wishes,   Dr. Lang

## 2024-07-08 NOTE — Progress Notes (Signed)
 Complete physical exam   Patient: Lynn Williams   DOB: [birth year]   89 y.o. Female  MRN: [id number] Visit Date: 07/08/2024  Today's healthcare provider: Rockie Agent, MD   Chief Complaint  Patient presents with   Annual Exam    Patient is present for annual exam with PCP.  Diet is normal per patient, not exercising currently Patient is c/o back and knee pain but states this is a chronic issue for her    Subjective    Lynn Williams is a 89 y.o. female who presents today for a complete physical exam.    She does not have additional problems to discuss today.   Discussed the use of AI scribe software for clinical note transcription with the patient, who gave verbal consent to proceed.  History of Present Illness Lynn Williams is a 89 year old female who presents for an annual physical exam.  She experiences chronic knee and back pain. She attends physical therapy for her knee and previously received a cortisone injection in her hip, which provided temporary relief. No physical therapy for her back. She was prescribed tramadol  50 mg every 12 hours as needed but only took it twice, finding it minimally effective. Tylenol provides some relief.  She reports pain in her side and back, with a decrease in urinary frequency from every half hour to every three to four hours. Urine remains clear. No changes in bladder or bowel control, numbness, or tingling sensations.  She is currently on Synthroid  for hypothyroidism and simvastatin  for hypercholesterolemia. She maintains a normal diet but does not engage in regular exercise.     Past Medical History:  Diagnosis Date   Arthritis    hands and wrist and knees   Cough    chronic/allergies   GERD (gastroesophageal reflux disease)    Hyperlipidemia    Hypertension    controlled on meds   Hypothyroidism    Wears partial dentures    upper   Past Surgical History:  Procedure Laterality Date   BREAST  CYST ASPIRATION     BREAST EXCISIONAL BIOPSY Right 1978   benign   CATARACT EXTRACTION Bilateral    ESOPHAGOGASTRODUODENOSCOPY (EGD) WITH PROPOFOL  N/A 01/16/2016   Procedure: ESOPHAGOGASTRODUODENOSCOPY (EGD) WITH PROPOFOL ;  Surgeon: Rogelia Copping, MD;  Location: [facility];  Service: Endoscopy;  Laterality: N/A;   TUBAL LIGATION     Social History   Socioeconomic History   Marital status: Married    Spouse name: Not on file   Number of children: 0   Years of education: Not on file   Highest education level: GED or equivalent  Occupational History   Occupation: retired  Tobacco Use   Smoking status: Never   Smokeless tobacco: Never  Vaping Use   Vaping status: Never Used  Substance and Sexual Activity   Alcohol use: No    Alcohol/week: 0.0 standard drinks of alcohol   Drug use: No   Sexual activity: Not Currently  Other Topics Concern   Not on file  Social History Narrative   Not on file   Social Drivers of Health   Tobacco Use: Low Risk (07/08/2024)   Patient History    Smoking Tobacco Use: Never    Smokeless Tobacco Use: Never    Passive Exposure: Not on file  Financial Resource Strain: Low Risk (07/10/2023)   Overall Financial Resource Strain (CARDIA)    Difficulty of Paying Living Expenses: Not hard at all  Food Insecurity:  No Food Insecurity (07/10/2023)   Hunger Vital Sign    Worried About Running Out of Food in the Last Year: Never true    Ran Out of Food in the Last Year: Never true  Transportation Needs: No Transportation Needs (07/10/2023)   PRAPARE - Administrator, Civil Service (Medical): No    Lack of Transportation (Non-Medical): No  Physical Activity: Sufficiently Active (07/10/2023)   Exercise Vital Sign    Days of Exercise per Week: 4 days    Minutes of Exercise per Session: 60 min  Stress: No Stress Concern Present (07/10/2023)   [employer] of Occupational Health - Occupational Stress Questionnaire    Feeling of Stress  : Only a little  Social Connections: Moderately Integrated (07/10/2023)   Social Connection and Isolation Panel    Frequency of Communication with Friends and Family: More than three times a week    Frequency of Social Gatherings with Friends and Family: More than three times a week    Attends Religious Services: More than 4 times per year    Active Member of [employer] or Organizations: No    Attends Banker Meetings: Never    Marital Status: Married  Catering Manager Violence: Not At Risk (07/10/2023)   Humiliation, Afraid, Rape, and Kick questionnaire    Fear of Current or Ex-Partner: No    Emotionally Abused: No    Physically Abused: No    Sexually Abused: No  Depression (PHQ2-9): Low Risk (07/08/2024)   Depression (PHQ2-9)    PHQ-2 Score: 0  Alcohol Screen: Low Risk (11/21/2022)   Alcohol Screen    Last Alcohol Screening Score (AUDIT): 0  Housing: Low Risk (07/10/2023)   Housing Stability Vital Sign    Unable to Pay for Housing in the Last Year: No    Number of Times Moved in the Last Year: 0    Homeless in the Last Year: No  Utilities: Not At Risk (07/10/2023)   AHC Utilities    Threatened with loss of utilities: No  Health Literacy: Adequate Health Literacy (07/10/2023)   B1300 Health Literacy    Frequency of need for help with medical instructions: Never   Family Status  Relation Name Status   Mother  Deceased   Father  Deceased   Brother  Deceased   Sister  Deceased   Brother  Deceased   Sister  Alive   Sister  (Not Specified)   Sister  (Not Specified)   Brother  (Not Specified)   Brother  (Not Specified)   Brother  (Not Specified)   Neg Hx  (Not Specified)  No partnership data on file   Family History  Problem Relation Age of Onset   Hypertension Mother    Heart attack Mother    Stroke Mother    Prostate cancer Father    Heart attack Brother    Stroke Sister    Heart attack Sister    Coronary artery disease Sister    Alzheimer's disease  Sister    Heart attack Brother    COPD Brother    Heart attack Sister    Coronary artery disease Sister    Diabetes Sister    Hypertension Sister    Heart disease Sister    Heart disease Brother    Heart disease Brother    COPD Brother    Breast cancer Neg Hx    Allergies[1]   Medications: Show/hide medication list[2]  Review of Systems  Last CBC Lab Results  Component Value Date   WBC 5.1 07/10/2023   HGB 11.0 (L) 07/10/2023   HCT 34.3 07/10/2023   MCV 90 07/10/2023   MCH 28.7 07/10/2023   RDW 12.5 07/10/2023   PLT 281 07/10/2023   Last metabolic panel Lab Results  Component Value Date   GLUCOSE 86 07/10/2023   NA 136 07/10/2023   K 4.1 07/10/2023   CL 96 07/10/2023   CO2 27 07/10/2023   BUN 14 07/10/2023   CREATININE 1.04 (H) 07/10/2023   EGFR 51 (L) 07/10/2023   CALCIUM 9.8 07/10/2023   PROT 6.6 07/10/2023   ALBUMIN 4.2 07/10/2023   LABGLOB 2.4 07/10/2023   AGRATIO 1.8 07/02/2022   BILITOT 0.5 07/10/2023   ALKPHOS 64 07/10/2023   AST 27 07/10/2023   ALT 14 07/10/2023   Last lipids Lab Results  Component Value Date   CHOL 138 07/02/2022   HDL 63 07/02/2022   LDLCALC 62 07/02/2022   TRIG 64 07/02/2022   CHOLHDL 2.2 07/02/2022   Last hemoglobin A1c No results found for: HGBA1C Last thyroid  functions Lab Results  Component Value Date   TSH 3.870 07/10/2023   FREET4 1.41 07/10/2023   Last vitamin D No results found for: 25OHVITD2, 25OHVITD3, VD25OH Last vitamin B12 and Folate No results found for: VITAMINB12, FOLATE     Objective    BP (!) 142/72 (BP Location: Left Arm, Patient Position: Sitting, Cuff Size: Normal) Comment: manual  Pulse 67   Ht 5' (1.524 m)   Wt 140 lb 11.2 oz (63.8 kg)   LMP  (LMP Unknown)   SpO2 100%   BMI 27.48 kg/m   BP Readings from Last 3 Encounters:  07/08/24 (!) 142/72  06/03/24 118/69  01/01/24 (!) 144/86   Wt Readings from Last 3 Encounters:  07/08/24 140 lb 11.2 oz (63.8 kg)  06/03/24  142 lb 9.6 oz (64.7 kg)  01/01/24 154 lb (69.9 kg)        Physical Exam  Physical Exam HEENT: Normal oropharynx. CHEST: Clear to auscultation bilaterally. ABDOMEN: Suprapubic tenderness present. No abdominal distention  MUSCULOSKELETAL: Tenderness around the patella and along the lumbar spine, no CVA tenderness, 5/5 strength in bilateral lower extremities  NEUROLOGICAL: Pupils reactive to light. Pt ambulates with cane, PERRLA    Last depression screening scores    07/08/2024    9:15 AM 06/03/2024    1:05 PM 01/01/2024    9:12 AM  PHQ 2/9 Scores  PHQ - 2 Score 0 0 0  PHQ- 9 Score 0 2 0      Data saved with a previous flowsheet row definition    Last fall risk screening    07/08/2024    9:15 AM  Fall Risk   Falls in the past year? 1  Number falls in past yr: 0  Injury with Fall? 0  Risk for fall due to : History of fall(s)  Follow up Falls evaluation completed    Last Audit-C alcohol use screening    11/21/2022    9:11 AM  Alcohol Use Disorder Test (AUDIT)  1. How often do you have a drink containing alcohol? 0  2. How many drinks containing alcohol do you have on a typical day when you are drinking? 0  3. How often do you have six or more drinks on one occasion? 0  AUDIT-C Score 0   A score of 3 or more in women, and 4 or more in men indicates increased risk for alcohol abuse, EXCEPT if  all of the points are from question 1   No results found for any visits on 07/08/24.  Assessment & Plan    Routine Health Maintenance and Physical Exam  Immunization History  Administered Date(s) Administered   Fluad Quad(high Dose 65+) 05/07/2019, 06/15/2020, 05/02/2021, 07/02/2022   Fluad Trivalent(High Dose 65+) 05/15/2023   INFLUENZA, HIGH DOSE SEASONAL PF 05/04/2015, 05/14/2016, 05/08/2017, 04/03/2018, 06/03/2024   PFIZER Comirnaty(Gray Top)Covid-19 Tri-Sucrose Vaccine 03/02/2021   PFIZER(Purple Top)SARS-COV-2 Vaccination 08/18/2019, 09/08/2019, 06/02/2020   Pfizer  Covid-19 Vaccine Bivalent Booster 69yrs & up 08/05/2021   Pneumococcal Conjugate-13 12/23/2013   Pneumococcal Polysaccharide-23 07/24/1996, 08/04/2003    Health Maintenance  Topic Date Due   Zoster Vaccines- Shingrix (1 of 2) Never done   Medicare Annual Wellness (AWV)  07/09/2024   COVID-19 Vaccine (6 - 2025-26 season) 07/24/2024 (Originally 03/23/2024)   Bone Density Scan  12/31/2024   Pneumococcal Vaccine: 50+ Years  Completed   Influenza Vaccine  Completed   Meningococcal B Vaccine  Aged Out   DTaP/Tdap/Td  Discontinued   Mammogram  Discontinued    Problem List Items Addressed This Visit     Adult hypothyroidism   Relevant Orders   TSH+T4F+T3Free   Annual physical exam - Primary   Cannot sleep   Chronic bilateral low back pain without sciatica   Relevant Medications   predniSONE  (DELTASONE ) 10 MG tablet   traMADol  (ULTRAM ) 50 MG tablet   Decreased urine output   Relevant Orders   Urine Culture   Urinalysis, Routine w reflex microscopic   Essential (primary) hypertension   Relevant Orders   Comprehensive metabolic panel with GFR   Hypercholesteremia   Relevant Orders   Lipid panel   Other Visit Diagnoses       Screening for deficiency anemia       Relevant Orders   CBC     Screening for diabetes mellitus       Relevant Orders   HgB A1c     Screening for lipid disorders         Chronic pain of right knee       Relevant Medications   predniSONE  (DELTASONE ) 10 MG tablet       Assessment and Plan Assessment & Plan Adult hypothyroidism Managed with Synthroid . - Ordered TSH, T4, T3  Essential hypertension Hypertension.  Hypercholesterolemia Managed with simvastatin .  Chronic low back pain with osteoarthritis and osteoporosis Chronic low back pain associated with osteoarthritis and osteoporosis. Pain localized along the waistline, no radiculopathy symptoms. Previous tramadol  use was ineffective. Discussed potential for steroid injections and neurosurgery  referral if necessary. - Referred to orthopedics for back evaluation - Prescribed prednisone  taper: 40 mg for 2 days, 30 mg for 2 days, 20 mg for 2 days, 10 mg for 2 days - Prescribed tramadol  50 mg every 8 hours as needed for pain, 5 day supply   Chronic right knee pain with osteoarthritis Chronic right knee pain with osteoarthritis. Previous cortisone injection in the hip provided temporary relief. Discussed potential for steroid injections and follow-up with orthopedics. - Referred to orthopedics for knee evaluation - Prescribed prednisone  taper as above - Prescribed tramadol  50 mg every 8 hours as needed for pain  Decreased urine output Reports decreased urine output with clear urine. No visible abnormalities in urine color. Differential includes kidney function issues. - Ordered urine analysis and culture - Ordered creatinine to assess kidney function - Will consider kidney ultrasound if initial tests are normal  General Health Maintenance Discussed Shingrix vaccine  and annual wellness visit. - Recommended Shingrix vaccine from pharmacy - Will schedule annual wellness visit with nurse health advisor next year       Return in about 3 months (around 10/06/2024) for Chronic F/U,knee and back pain, thyroid  .       Rockie Agent, MD  [facility] [phone] (phone) [phone] (fax)  [facility]     [1]  Allergies Allergen Reactions   Sulfa Antibiotics Itching  [2]  Outpatient Medications Prior to Visit  Medication Sig   alendronate  (FOSAMAX ) 70 MG tablet Take 1 tablet (70 mg total) by mouth once a week. Take with a full glass of water  on an empty stomach.   Calcium-Magnesium-Vitamin D (CALCIUM 500 PO) Take by mouth. am   cetirizine (ZYRTEC) 10 MG tablet Take by mouth as needed.    cholecalciferol (VITAMIN D3) 25 MCG (1000 UNIT) tablet Take 1,000 Units by mouth daily.   hydrochlorothiazide  (HYDRODIURIL ) 25 MG  tablet TAKE 1 TABLET EVERY DAY   levothyroxine  (SYNTHROID ) 50 MCG tablet TAKE 1 TABLET EVERY DAY   losartan  (COZAAR ) 100 MG tablet TAKE 1 TABLET EVERY DAY   Multiple Vitamins-Minerals (MULTIVITAMIN ADULT PO) Take by mouth. am   pantoprazole  (PROTONIX ) 40 MG tablet TAKE 1 TABLET EVERY DAY   simvastatin  (ZOCOR ) 20 MG tablet TAKE 1 TABLET AT BEDTIME   vitamin B-12 (CYANOCOBALAMIN) 50 MCG tablet Take 50 mcg by mouth daily.   [DISCONTINUED] traMADol  (ULTRAM ) 50 MG tablet Take 1 tablet (50 mg total) by mouth every 8 (eight) hours as needed.   No facility-administered medications prior to visit.

## 2024-07-09 ENCOUNTER — Encounter: Payer: Self-pay | Admitting: Family Medicine

## 2024-07-09 ENCOUNTER — Ambulatory Visit: Payer: Self-pay | Admitting: Family Medicine

## 2024-07-09 DIAGNOSIS — E039 Hypothyroidism, unspecified: Secondary | ICD-10-CM

## 2024-07-09 LAB — COMPREHENSIVE METABOLIC PANEL WITH GFR
ALT: 12 IU/L (ref 0–32)
AST: 20 IU/L (ref 0–40)
Albumin: 4 g/dL (ref 3.6–4.6)
Alkaline Phosphatase: 79 IU/L (ref 48–129)
BUN/Creatinine Ratio: 13 (ref 12–28)
BUN: 10 mg/dL (ref 10–36)
Bilirubin Total: 0.4 mg/dL (ref 0.0–1.2)
CO2: 25 mmol/L (ref 20–29)
Calcium: 9.9 mg/dL (ref 8.7–10.3)
Chloride: 90 mmol/L — ABNORMAL LOW (ref 96–106)
Creatinine, Ser: 0.76 mg/dL (ref 0.57–1.00)
Globulin, Total: 2.3 g/dL (ref 1.5–4.5)
Glucose: 81 mg/dL (ref 70–99)
Potassium: 4.1 mmol/L (ref 3.5–5.2)
Sodium: 133 mmol/L — ABNORMAL LOW (ref 134–144)
Total Protein: 6.3 g/dL (ref 6.0–8.5)
eGFR: 74 mL/min/1.73 (ref >59)

## 2024-07-09 LAB — CBC
Hematocrit: 33.5 % — ABNORMAL LOW (ref 34.0–46.6)
Hemoglobin: 10.9 g/dL — ABNORMAL LOW (ref 11.1–15.9)
MCH: 28.2 pg (ref 26.6–33.0)
MCHC: 32.5 g/dL (ref 31.5–35.7)
MCV: 87 fL (ref 79–97)
Platelets: 320 x10E3/uL (ref 150–450)
RBC: 3.86 x10E6/uL (ref 3.77–5.28)
RDW: 13.8 % (ref 11.7–15.4)
WBC: 6.3 x10E3/uL (ref 3.4–10.8)

## 2024-07-09 LAB — TSH+T4F+T3FREE
Free T4: 1.19 ng/dL (ref 0.82–1.77)
T3, Free: 2.4 pg/mL (ref 2.0–4.4)
TSH: 16.2 u[IU]/mL — ABNORMAL HIGH (ref 0.450–4.500)

## 2024-07-09 LAB — LIPID PANEL
Chol/HDL Ratio: 2 ratio (ref 0.0–4.4)
Cholesterol, Total: 146 mg/dL (ref 100–199)
HDL: 73 mg/dL (ref >39)
LDL Chol Calc (NIH): 62 mg/dL (ref 0–99)
Triglycerides: 53 mg/dL (ref 0–149)
VLDL Cholesterol Cal: 11 mg/dL (ref 5–40)

## 2024-07-09 LAB — HEMOGLOBIN A1C
Est. average glucose Bld gHb Est-mCnc: 123 mg/dL
Hgb A1c MFr Bld: 5.9 % — ABNORMAL HIGH (ref 4.8–5.6)

## 2024-07-09 MED ORDER — LEVOTHYROXINE SODIUM 75 MCG PO TABS: 75.0000 ug | ORAL_TABLET | Freq: Every day | ORAL | 0 refills | Status: AC

## 2024-07-10 LAB — URINE CULTURE

## 2024-07-13 ENCOUNTER — Telehealth: Payer: Self-pay

## 2024-07-13 ENCOUNTER — Other Ambulatory Visit: Payer: Self-pay

## 2024-07-13 DIAGNOSIS — E039 Hypothyroidism, unspecified: Secondary | ICD-10-CM

## 2024-07-13 NOTE — Telephone Encounter (Signed)
 Copied from CRM [id number]. Topic: General - Other >> Jul 13, 2024  2:20 PM Zebedee SAUNDERS wrote: Reason for CRM: Pt needs to schedule 6 week recheck for TSH level which Dr. Marcine recommended. Please call pt at [phone]. Pt called for lab results with were provided. Pt would like labs mailed to her at [address] [place] [address]. >> Jul 13, 2024  2:47 PM Delon BIRCH wrote: Lab results mailed per pt request.  Please place lab order for pt to return in 6 weeks for recheck.  Pt advised that no appt needed for recheck of labs.

## 2024-07-13 NOTE — Telephone Encounter (Signed)
 Spoke with pt and advise no appt is needed. Have placed future order to recheck TSH. Pt verbalized understanding.

## 2024-07-15 ENCOUNTER — Other Ambulatory Visit: Payer: Self-pay | Admitting: Family Medicine

## 2024-07-15 DIAGNOSIS — M810 Age-related osteoporosis without current pathological fracture: Secondary | ICD-10-CM

## 2024-10-12 ENCOUNTER — Ambulatory Visit: Admitting: Family Medicine
